# Patient Record
Sex: Male | Born: 1940 | Race: White | Hispanic: No | Marital: Married | Smoking: Former smoker
Health system: Southern US, Community
[De-identification: ages and names within clinical notes are randomized; demographics above are authoritative.]

## PROBLEM LIST (undated history)

## (undated) DIAGNOSIS — F0390 Unspecified dementia without behavioral disturbance: Secondary | ICD-10-CM

## (undated) DIAGNOSIS — M1990 Unspecified osteoarthritis, unspecified site: Secondary | ICD-10-CM

## (undated) DIAGNOSIS — K1321 Leukoplakia of oral mucosa, including tongue: Secondary | ICD-10-CM

## (undated) DIAGNOSIS — J309 Allergic rhinitis, unspecified: Secondary | ICD-10-CM

## (undated) DIAGNOSIS — K219 Gastro-esophageal reflux disease without esophagitis: Secondary | ICD-10-CM

## (undated) DIAGNOSIS — K264 Chronic or unspecified duodenal ulcer with hemorrhage: Secondary | ICD-10-CM

## (undated) DIAGNOSIS — I10 Essential (primary) hypertension: Secondary | ICD-10-CM

## (undated) DIAGNOSIS — G20A1 Parkinson's disease without dyskinesia, without mention of fluctuations: Secondary | ICD-10-CM

## (undated) DIAGNOSIS — H409 Unspecified glaucoma: Secondary | ICD-10-CM

## (undated) DIAGNOSIS — E7800 Pure hypercholesterolemia, unspecified: Secondary | ICD-10-CM

## (undated) DIAGNOSIS — N529 Male erectile dysfunction, unspecified: Secondary | ICD-10-CM

## (undated) DIAGNOSIS — D649 Anemia, unspecified: Secondary | ICD-10-CM

## (undated) DIAGNOSIS — E782 Mixed hyperlipidemia: Secondary | ICD-10-CM

## (undated) DIAGNOSIS — G20 Parkinson's disease: Secondary | ICD-10-CM

## (undated) DIAGNOSIS — N320 Bladder-neck obstruction: Secondary | ICD-10-CM

## (undated) HISTORY — DX: Leukoplakia of oral mucosa, including tongue: K13.21

## (undated) HISTORY — DX: Male erectile dysfunction, unspecified: N52.9

## (undated) HISTORY — DX: Bladder-neck obstruction: N32.0

## (undated) HISTORY — DX: Parkinson's disease without dyskinesia, without mention of fluctuations: G20.A1

## (undated) HISTORY — DX: Unspecified dementia, unspecified severity, without behavioral disturbance, psychotic disturbance, mood disturbance, and anxiety: F03.90

## (undated) HISTORY — DX: Allergic rhinitis, unspecified: J30.9

## (undated) HISTORY — DX: Unspecified glaucoma: H40.9

## (undated) HISTORY — DX: Pure hypercholesterolemia, unspecified: E78.00

## (undated) HISTORY — DX: Parkinson's disease: G20

## (undated) HISTORY — DX: Anemia, unspecified: D64.9

## (undated) HISTORY — DX: Mixed hyperlipidemia: E78.2

## (undated) HISTORY — DX: Chronic or unspecified duodenal ulcer with hemorrhage: K26.4

## (undated) HISTORY — PX: COLONOSCOPY: SHX174

---

## 2008-11-12 ENCOUNTER — Ambulatory Visit: Payer: Self-pay | Admitting: Unknown Physician Specialty

## 2009-04-28 ENCOUNTER — Encounter: Admission: RE | Admit: 2009-04-28 | Discharge: 2009-04-28 | Payer: Self-pay | Admitting: Neurological Surgery

## 2009-07-17 HISTORY — PX: BACK SURGERY: SHX140

## 2009-08-10 ENCOUNTER — Inpatient Hospital Stay (HOSPITAL_COMMUNITY): Admission: RE | Admit: 2009-08-10 | Discharge: 2009-08-11 | Payer: Self-pay | Admitting: Neurological Surgery

## 2010-07-06 LAB — BASIC METABOLIC PANEL
Calcium: 9.4 mg/dL (ref 8.4–10.5)
Creatinine, Ser: 1.02 mg/dL (ref 0.4–1.5)
GFR calc non Af Amer: >60 mL/min (ref >60)
Potassium: 4.5 mEq/L (ref 3.5–5.1)
Sodium: 139 mEq/L (ref 135–145)

## 2010-07-06 LAB — CBC
HCT: 34.9 % — ABNORMAL LOW (ref 39.0–52.0)
Hemoglobin: 12.3 g/dL — ABNORMAL LOW (ref 13.0–17.0)
MCV: 98.5 fL (ref 78.0–100.0)
RBC: 3.54 MIL/uL — ABNORMAL LOW (ref 4.22–5.81)
WBC: 7.7 10*3/uL (ref 4.0–10.5)

## 2010-07-06 LAB — SURGICAL PCR SCREEN
MRSA, PCR: NEGATIVE
Staphylococcus aureus: POSITIVE — AB

## 2010-07-18 ENCOUNTER — Ambulatory Visit: Payer: Self-pay | Admitting: Internal Medicine

## 2010-08-16 ENCOUNTER — Ambulatory Visit: Payer: Self-pay | Admitting: Internal Medicine

## 2010-08-18 ENCOUNTER — Ambulatory Visit: Payer: Self-pay | Admitting: Internal Medicine

## 2010-09-17 ENCOUNTER — Ambulatory Visit: Payer: Self-pay | Admitting: Internal Medicine

## 2010-11-26 ENCOUNTER — Ambulatory Visit: Payer: Self-pay | Admitting: Internal Medicine

## 2010-12-18 ENCOUNTER — Ambulatory Visit: Payer: Self-pay | Admitting: Internal Medicine

## 2011-03-24 ENCOUNTER — Other Ambulatory Visit: Payer: Self-pay | Admitting: Neurological Surgery

## 2011-03-24 DIAGNOSIS — M5126 Other intervertebral disc displacement, lumbar region: Secondary | ICD-10-CM

## 2011-03-25 ENCOUNTER — Ambulatory Visit: Payer: Self-pay | Admitting: Internal Medicine

## 2011-03-30 ENCOUNTER — Ambulatory Visit
Admission: RE | Admit: 2011-03-30 | Discharge: 2011-03-30 | Disposition: A | Discharge status: Home / Self Care | Payer: Medicare Other | Source: Ambulatory Visit | Attending: Neurological Surgery | Admitting: Neurological Surgery

## 2011-03-30 DIAGNOSIS — M5126 Other intervertebral disc displacement, lumbar region: Secondary | ICD-10-CM

## 2011-03-30 MED ORDER — GADOBENATE DIMEGLUMINE 529 MG/ML IV SOLN
15.0000 mL | Freq: Once | INTRAVENOUS | Status: AC | PRN
  Administered 2011-03-30: 15 mL via INTRAVENOUS

## 2011-04-07 ENCOUNTER — Other Ambulatory Visit: Payer: Self-pay | Admitting: Neurological Surgery

## 2011-04-19 ENCOUNTER — Ambulatory Visit: Payer: Self-pay | Admitting: Internal Medicine

## 2011-04-25 ENCOUNTER — Encounter (HOSPITAL_COMMUNITY): Payer: Self-pay | Admitting: Pharmacy Technician

## 2011-04-27 NOTE — Pre-Procedure Instructions (Signed)
20 Michael Stevens  04/27/2011   Your procedure is scheduled on:  Friday, January 18th.   Report to [facility] at 5:30 AM.   Call this number if you have problems the morning of surgery: [phone]   Remember:   Do not eat food:After Midnight.  May have clear liquids: up to 4 Hours before arrival. 1:30 am  Clear liquids include soda, tea, black coffee, apple or grape juice, broth.  Take these medicines the morning of surgery with A SIP OF WATER: Hydrocodone- Acetaminophen   Do not wear jewelry, make-up or nail polish.  Do not wear lotions, powders, or perfumes. You may wear deodorant.  Do not shave 48 hours prior to surgery.  Do not bring valuables to the hospital.  Contacts, dentures or bridgework may not be worn into surgery.  Leave suitcase in the car. After surgery it may be brought to your room.  For patients admitted to the hospital, checkout time is 11:00 AM the day of discharge.   Patients discharged the day of surgery will not be allowed to drive home.  Name and phone number of your driver: -  Special Instructions: CHG Shower Use Special Wash: 1/2 bottle night before surgery and 1/2 bottle morning of surgery.   Please read over the following fact sheets that you were given: Pain Booklet, Coughing and Deep Breathing, Blood Transfusion Information, MRSA Information and Surgical Site Infection Prevention

## 2011-04-28 ENCOUNTER — Encounter (HOSPITAL_COMMUNITY): Payer: Self-pay

## 2011-04-28 ENCOUNTER — Encounter (HOSPITAL_COMMUNITY)
Admission: RE | Admit: 2011-04-28 | Discharge: 2011-04-28 | Disposition: A | Discharge status: Home / Self Care | Payer: Medicare Other | Source: Ambulatory Visit | Attending: Anesthesiology | Admitting: Anesthesiology

## 2011-04-28 ENCOUNTER — Encounter (HOSPITAL_COMMUNITY)
Admission: RE | Admit: 2011-04-28 | Discharge: 2011-04-28 | Disposition: A | Discharge status: Home / Self Care | Payer: Medicare Other | Source: Ambulatory Visit | Attending: Neurological Surgery | Admitting: Neurological Surgery

## 2011-04-28 DIAGNOSIS — Z01818 Encounter for other preprocedural examination: Secondary | ICD-10-CM | POA: Insufficient documentation

## 2011-04-28 DIAGNOSIS — Z01810 Encounter for preprocedural cardiovascular examination: Secondary | ICD-10-CM | POA: Insufficient documentation

## 2011-04-28 DIAGNOSIS — Z01812 Encounter for preprocedural laboratory examination: Secondary | ICD-10-CM | POA: Insufficient documentation

## 2011-04-28 HISTORY — DX: Unspecified osteoarthritis, unspecified site: M19.90

## 2011-04-28 HISTORY — DX: Essential (primary) hypertension: I10

## 2011-04-28 HISTORY — DX: Gastro-esophageal reflux disease without esophagitis: K21.9

## 2011-04-28 LAB — BASIC METABOLIC PANEL
BUN: 14 mg/dL (ref 6–23)
Chloride: 104 mEq/L (ref 96–112)
Creatinine, Ser: 1.01 mg/dL (ref 0.50–1.35)
GFR calc Af Amer: 85 mL/min — ABNORMAL LOW (ref >90)
GFR calc non Af Amer: 73 mL/min — ABNORMAL LOW (ref >90)
Potassium: 4.3 mEq/L (ref 3.5–5.1)

## 2011-04-28 LAB — CBC
Hemoglobin: 11.9 g/dL — ABNORMAL LOW (ref 13.0–17.0)
MCHC: 34.8 g/dL (ref 30.0–36.0)
Platelets: 139 10*3/uL — ABNORMAL LOW (ref 150–400)

## 2011-04-28 LAB — SURGICAL PCR SCREEN
MRSA, PCR: NEGATIVE
Staphylococcus aureus: NEGATIVE

## 2011-05-05 MED ORDER — CEFAZOLIN SODIUM 1-5 GM-% IV SOLN: 1.0000 g | INTRAVENOUS | Status: DC

## 2011-05-05 NOTE — Progress Notes (Signed)
Spoke with Marcelino Duster at [facility] about EKG we needed from Dr. Milinda Cave is going to fax it over. Aware pt is for OR 1/18 @ 0730.

## 2011-05-06 ENCOUNTER — Ambulatory Visit (HOSPITAL_COMMUNITY): Admission: RE | Admit: 2011-05-06 | Payer: Medicare Other | Source: Ambulatory Visit | Admitting: Neurological Surgery

## 2011-05-06 ENCOUNTER — Encounter (HOSPITAL_COMMUNITY): Admission: RE | Payer: Self-pay | Source: Ambulatory Visit

## 2011-05-06 SURGERY — LUMBAR LAMINECTOMY/ DECOMPRESSION WITH MET-RX
Anesthesia: General | Site: Back | Laterality: Left

## 2011-05-09 ENCOUNTER — Ambulatory Visit: Payer: Self-pay | Admitting: Family Medicine

## 2011-10-05 DIAGNOSIS — D3701 Neoplasm of uncertain behavior of lip: Secondary | ICD-10-CM | POA: Insufficient documentation

## 2011-11-21 DIAGNOSIS — D1030 Benign neoplasm of unspecified part of mouth: Secondary | ICD-10-CM | POA: Insufficient documentation

## 2012-03-05 ENCOUNTER — Ambulatory Visit: Payer: Self-pay | Admitting: Otolaryngology

## 2012-10-18 ENCOUNTER — Other Ambulatory Visit: Payer: Self-pay | Admitting: Neurological Surgery

## 2012-10-18 DIAGNOSIS — M48061 Spinal stenosis, lumbar region without neurogenic claudication: Secondary | ICD-10-CM

## 2012-10-18 DIAGNOSIS — M47812 Spondylosis without myelopathy or radiculopathy, cervical region: Secondary | ICD-10-CM

## 2012-10-29 ENCOUNTER — Ambulatory Visit
Admission: RE | Admit: 2012-10-29 | Discharge: 2012-10-29 | Disposition: A | Discharge status: Home / Self Care | Payer: Medicare Other | Source: Ambulatory Visit | Attending: Neurological Surgery | Admitting: Neurological Surgery

## 2012-10-29 DIAGNOSIS — M48061 Spinal stenosis, lumbar region without neurogenic claudication: Secondary | ICD-10-CM

## 2012-10-29 DIAGNOSIS — M47812 Spondylosis without myelopathy or radiculopathy, cervical region: Secondary | ICD-10-CM

## 2014-02-06 DIAGNOSIS — R195 Other fecal abnormalities: Secondary | ICD-10-CM | POA: Insufficient documentation

## 2014-02-27 ENCOUNTER — Ambulatory Visit: Payer: Self-pay | Admitting: Unknown Physician Specialty

## 2014-06-17 DIAGNOSIS — N320 Bladder-neck obstruction: Secondary | ICD-10-CM | POA: Diagnosis not present

## 2014-06-17 DIAGNOSIS — E785 Hyperlipidemia, unspecified: Secondary | ICD-10-CM | POA: Diagnosis not present

## 2014-06-17 DIAGNOSIS — I10 Essential (primary) hypertension: Secondary | ICD-10-CM | POA: Diagnosis not present

## 2014-06-17 DIAGNOSIS — K219 Gastro-esophageal reflux disease without esophagitis: Secondary | ICD-10-CM | POA: Diagnosis not present

## 2014-06-17 LAB — LIPID PANEL
CHOLESTEROL: 170 mg/dL (ref 0–200)
HDL: 46 mg/dL (ref 35–70)
LDL Cholesterol: 92 mg/dL
TRIGLYCERIDES: 159 mg/dL (ref 40–160)

## 2014-06-17 LAB — CBC AND DIFFERENTIAL
HEMATOCRIT: 36 % — AB (ref 41–53)
Hemoglobin: 12.1 g/dL — AB (ref 13.5–17.5)
Neutrophils Absolute: 5 /uL
PLATELETS: 134 10*3/uL — AB (ref 150–399)
WBC: 10.1 10*3/mL

## 2014-06-17 LAB — BASIC METABOLIC PANEL
BUN: 18 mg/dL (ref 4–21)
CREATININE: 1.1 mg/dL (ref 0.6–1.3)
GLUCOSE: 109 mg/dL
Potassium: 4.1 mmol/L (ref 3.4–5.3)
SODIUM: 137 mmol/L (ref 137–147)

## 2014-06-17 LAB — HEPATIC FUNCTION PANEL
ALK PHOS: 44 U/L (ref 25–125)
ALT: 9 U/L — AB (ref 10–40)
AST: 17 U/L (ref 14–40)
BILIRUBIN DIRECT: 0.11 mg/dL (ref 0.01–0.4)
Bilirubin, Total: 0.4 mg/dL

## 2014-06-18 ENCOUNTER — Ambulatory Visit: Payer: Self-pay | Admitting: Family Medicine

## 2014-06-18 DIAGNOSIS — R1013 Epigastric pain: Secondary | ICD-10-CM | POA: Diagnosis not present

## 2014-06-18 DIAGNOSIS — I10 Essential (primary) hypertension: Secondary | ICD-10-CM | POA: Diagnosis not present

## 2014-08-11 LAB — SURGICAL PATHOLOGY

## 2014-08-12 DIAGNOSIS — J301 Allergic rhinitis due to pollen: Secondary | ICD-10-CM | POA: Diagnosis not present

## 2014-08-12 DIAGNOSIS — K1321 Leukoplakia of oral mucosa, including tongue: Secondary | ICD-10-CM | POA: Diagnosis not present

## 2014-10-13 ENCOUNTER — Encounter: Payer: Self-pay | Admitting: Family Medicine

## 2014-10-13 ENCOUNTER — Ambulatory Visit (INDEPENDENT_AMBULATORY_CARE_PROVIDER_SITE_OTHER): Payer: Medicare Other | Admitting: Family Medicine

## 2014-10-13 VITALS — BP 118/78 | HR 69 | Temp 97.9°F | Resp 16 | Ht 65.0 in | Wt 170.8 lb

## 2014-10-13 DIAGNOSIS — H409 Unspecified glaucoma: Secondary | ICD-10-CM | POA: Insufficient documentation

## 2014-10-13 DIAGNOSIS — E7800 Pure hypercholesterolemia, unspecified: Secondary | ICD-10-CM | POA: Insufficient documentation

## 2014-10-13 DIAGNOSIS — K2970 Gastritis, unspecified, without bleeding: Secondary | ICD-10-CM | POA: Insufficient documentation

## 2014-10-13 DIAGNOSIS — K219 Gastro-esophageal reflux disease without esophagitis: Secondary | ICD-10-CM

## 2014-10-13 DIAGNOSIS — N320 Bladder-neck obstruction: Secondary | ICD-10-CM | POA: Insufficient documentation

## 2014-10-13 DIAGNOSIS — I10 Essential (primary) hypertension: Secondary | ICD-10-CM | POA: Insufficient documentation

## 2014-10-13 DIAGNOSIS — J309 Allergic rhinitis, unspecified: Secondary | ICD-10-CM | POA: Insufficient documentation

## 2014-10-13 DIAGNOSIS — K5790 Diverticulosis of intestine, part unspecified, without perforation or abscess without bleeding: Secondary | ICD-10-CM | POA: Insufficient documentation

## 2014-10-13 NOTE — Progress Notes (Signed)
Name: Michael Stevens   MRN: [id number]    DOB: 06-27-1940   Date:10/13/2014       Progress Note  Subjective  Chief Complaint  Chief Complaint  Patient presents with  . Hypertension    Patient does not check at home, stable on medications  . Gastrophageal Reflux    Patient states that omeprazole keeps symptoms under good control     Hypertension This is a chronic problem. The problem is unchanged. The problem is controlled. Pertinent negatives include no chest pain, headaches, palpitations, peripheral edema or shortness of breath. Past treatments include ACE inhibitors. The current treatment provides significant improvement. There is no history of angina, kidney disease, CAD/MI or CVA.  Gastrophageal Reflux He complains of dysphagia (occasionally he has some difficulty swallowing). He reports no chest pain, no coughing, no heartburn, no nausea or no sore throat. This is a chronic problem. The problem has been unchanged. The symptoms are aggravated by certain foods. Associated symptoms include anemia (mild anemia). Pertinent negatives include no fatigue or melena. Risk factors include NSAIDs. He has tried a PPI for the symptoms. The treatment provided significant relief. Past procedures do not include an EGD.  Hyperlipidemia This is a chronic problem. Recent lipid tests were reviewed and are normal. Pertinent negatives include no chest pain, leg pain, myalgias or shortness of breath. Current antihyperlipidemic treatment includes statins. The current treatment provides significant improvement of lipids.      Past Medical History  Diagnosis Date  . Hypertension   . GERD (gastroesophageal reflux disease)     ocassional  . Arthritis     Back  . Anemia   . Glaucoma   . Allergic rhinitis   . Impotence   . Bladder neck obstruction     Past Surgical History  Procedure Laterality Date  . Back surgery  April 2011    Family History  Problem Relation Age of Onset  . Anesthesia problems  Neg Hx     History   Social History  . Marital Status: Married    Spouse Name: Michael Stevens  . Number of Children: 2  . Years of Education: N/A   Occupational History  . Retired    Social History Main Topics  . Smoking status: Former Smoker -- 25 years    Quit date: 12/01/1982  . Smokeless tobacco: Not on file  . Alcohol Use: 0.6 oz/week    1 Cans of beer per week     Comment: occasional  . Drug Use: No  . Sexual Activity: Not on file   Other Topics Concern  . Not on file   Social History Narrative     Current outpatient prescriptions:  .  brimonidine-timolol (COMBIGAN) 0.2-0.5 % ophthalmic solution, Place 1 drop into both eyes every 12 (twelve) hours.  , Disp: , Rfl:  .  Camphor-Eucalyptus-Menthol (VICKS VAPORUB EX), Apply 1 application topically at bedtime. To clear nasal congestion, Disp: , Rfl:  .  ibuprofen (ADVIL,MOTRIN) 200 MG tablet, Take 1 tablet by mouth as needed., Disp: , Rfl:  .  omeprazole (PRILOSEC) 40 MG capsule, Take 40 mg by mouth daily as needed., Disp: , Rfl:  .  quinapril (ACCUPRIL) 40 MG tablet, Take 40 mg by mouth at bedtime.  , Disp: , Rfl:  .  simvastatin (ZOCOR) 40 MG tablet, Take 40 mg by mouth every evening. , Disp: , Rfl:  .  tamsulosin (FLOMAX) 0.4 MG CAPS capsule, Take 1 capsule by mouth daily., Disp: , Rfl:   No  Known Allergies   Review of Systems  Constitutional: Negative for fatigue.  HENT: Negative for sore throat.   Respiratory: Negative for cough and shortness of breath.   Cardiovascular: Negative for chest pain and palpitations.  Gastrointestinal: Positive for dysphagia (occasionally he has some difficulty swallowing). Negative for heartburn, nausea and melena.  Musculoskeletal: Negative for myalgias.  Neurological: Negative for headaches.      Objective  Filed Vitals:   10/13/14 1044  BP: 118/78  Pulse: 69  Temp: 97.9 F (36.6 C)  TempSrc: Oral  Resp: 16  Height: 5\' 5"  (1.651 m)  Weight: 170 lb 12.8 oz (77.474 kg)   SpO2: 98%    Physical Exam  Constitutional: He is oriented to person, place, and time and well-developed, well-nourished, and in no distress.  HENT:  Head: Normocephalic and atraumatic.  Mouth/Throat: Oropharynx is clear and moist.  Eyes: Conjunctivae are normal. Pupils are equal, round, and reactive to light.  Neck: Normal range of motion. Neck supple.  Cardiovascular: Normal rate and regular rhythm.   Pulmonary/Chest: Effort normal and breath sounds normal.  Abdominal: Soft. Bowel sounds are normal.  Neurological: He is alert and oriented to person, place, and time.  Skin: Skin is warm and dry.  Nursing note and vitals reviewed.      No results found for this or any previous visit (from the past 2160 hour(s)).   Assessment & Plan 1. Essential hypertension Blood pressure at goal on present therapy.  2. Hypercholesteremia Patient is on simvastatin 40 mg at bedtime. Last fasting lipid panel showed elevated triglycerides at  159. Repeat today. - Lipid panel  3. Gastroesophageal reflux disease, esophagitis presence not specified Symptoms controlled on present PPI therapy.  - CBC  There are no diagnoses linked to this encounter.  Talmage Teaster Asad A[facility] 10/13/2014 11:00 AM

## 2014-10-14 LAB — CBC
HEMOGLOBIN: 11.9 g/dL — AB (ref 12.6–17.7)
Hematocrit: 35.4 % — ABNORMAL LOW (ref 37.5–51.0)
MCH: 31.1 pg (ref 26.6–33.0)
MCHC: 33.6 g/dL (ref 31.5–35.7)
MCV: 92 fL (ref 79–97)
Platelets: 130 10*3/uL — ABNORMAL LOW (ref 150–379)
RBC: 3.83 x10E6/uL — AB (ref 4.14–5.80)
RDW: 16.8 % — ABNORMAL HIGH (ref 12.3–15.4)
WBC: 8 10*3/uL (ref 3.4–10.8)

## 2014-10-14 LAB — LIPID PANEL
CHOL/HDL RATIO: 3.8 ratio (ref 0.0–5.0)
Cholesterol, Total: 159 mg/dL (ref 100–199)
HDL: 42 mg/dL (ref >39)
LDL CALC: 84 mg/dL (ref 0–99)
Triglycerides: 166 mg/dL — ABNORMAL HIGH (ref 0–149)
VLDL CHOLESTEROL CAL: 33 mg/dL (ref 5–40)

## 2014-10-15 NOTE — Progress Notes (Signed)
Called pt, advised of labs, recheck in 4 months.

## 2014-11-03 ENCOUNTER — Other Ambulatory Visit: Payer: Self-pay | Admitting: Family Medicine

## 2014-11-03 MED ORDER — QUINAPRIL HCL 40 MG PO TABS: 40.0000 mg | ORAL_TABLET | Freq: Every day | ORAL | Status: DC

## 2014-11-03 MED ORDER — TAMSULOSIN HCL 0.4 MG PO CAPS: 0.4000 mg | ORAL_CAPSULE | Freq: Every day | ORAL | Status: DC

## 2014-11-03 MED ORDER — OMEPRAZOLE 40 MG PO CPDR: 40.0000 mg | DELAYED_RELEASE_CAPSULE | Freq: Every day | ORAL | Status: DC | PRN

## 2014-11-03 MED ORDER — SIMVASTATIN 40 MG PO TABS: 40.0000 mg | ORAL_TABLET | Freq: Every evening | ORAL | Status: DC

## 2014-11-03 NOTE — Telephone Encounter (Signed)
Medication has been refilled and sent to [facility].  

## 2015-01-13 ENCOUNTER — Ambulatory Visit: Payer: Medicare Other | Admitting: Family Medicine

## 2015-01-14 ENCOUNTER — Encounter: Payer: Self-pay | Admitting: Family Medicine

## 2015-01-14 ENCOUNTER — Ambulatory Visit (INDEPENDENT_AMBULATORY_CARE_PROVIDER_SITE_OTHER): Payer: Medicare Other | Admitting: Family Medicine

## 2015-01-14 VITALS — BP 118/75 | HR 78 | Temp 97.6°F | Resp 18 | Ht 65.0 in | Wt 173.3 lb

## 2015-01-14 DIAGNOSIS — N320 Bladder-neck obstruction: Secondary | ICD-10-CM

## 2015-01-14 DIAGNOSIS — E7800 Pure hypercholesterolemia, unspecified: Secondary | ICD-10-CM

## 2015-01-14 DIAGNOSIS — K219 Gastro-esophageal reflux disease without esophagitis: Secondary | ICD-10-CM

## 2015-01-14 DIAGNOSIS — I10 Essential (primary) hypertension: Secondary | ICD-10-CM | POA: Diagnosis not present

## 2015-01-14 DIAGNOSIS — D649 Anemia, unspecified: Secondary | ICD-10-CM | POA: Diagnosis not present

## 2015-01-14 MED ORDER — TAMSULOSIN HCL 0.4 MG PO CAPS: 0.4000 mg | ORAL_CAPSULE | Freq: Every day | ORAL | Status: DC

## 2015-01-14 MED ORDER — QUINAPRIL HCL 40 MG PO TABS: 40.0000 mg | ORAL_TABLET | Freq: Every day | ORAL | Status: DC

## 2015-01-14 MED ORDER — SIMVASTATIN 40 MG PO TABS: 40.0000 mg | ORAL_TABLET | Freq: Every evening | ORAL | Status: DC

## 2015-01-14 MED ORDER — OMEPRAZOLE 40 MG PO CPDR: 40.0000 mg | DELAYED_RELEASE_CAPSULE | Freq: Every day | ORAL | Status: DC

## 2015-01-14 NOTE — Progress Notes (Signed)
Name: Michael Stevens   MRN: [id number]    DOB: Oct 02, 1940   Date:01/14/2015       Progress Note  Subjective  Chief Complaint  Chief Complaint  Patient presents with  . Follow-up    3 mo  . Hypertension    Hyperlipidemia This is a chronic problem. The problem is controlled. Recent lipid tests were reviewed and are normal. Pertinent negatives include no chest pain, leg pain or myalgias. Current antihyperlipidemic treatment includes statins.  Anemia Presents for follow-up visit. There has been no abdominal pain, anorexia, palpitations or weight loss. Signs of blood loss that are not present include hematemesis and melena.  Hypertension This is a chronic problem. The problem is controlled. Pertinent negatives include no chest pain, headaches or palpitations. Past treatments include ACE inhibitors. There is no history of kidney disease, CAD/MI or CVA.    Past Medical History  Diagnosis Date  . Hypertension   . GERD (gastroesophageal reflux disease)     ocassional  . Arthritis     Back  . Anemia   . Glaucoma   . Allergic rhinitis   . Impotence   . Bladder neck obstruction     Past Surgical History  Procedure Laterality Date  . Back surgery  April 2011    Family History  Problem Relation Age of Onset  . Anesthesia problems Neg Hx     Social History   Social History  . Marital Status: Married    Spouse Name: [name]  . Number of Children: 2  . Years of Education: N/A   Occupational History  . Retired    Social History Main Topics  . Smoking status: Former Smoker -- 25 years    Quit date: 12/01/1982  . Smokeless tobacco: Not on file  . Alcohol Use: 0.6 oz/week    1 Cans of beer per week     Comment: occasional  . Drug Use: No  . Sexual Activity: Not on file   Other Topics Concern  . Not on file   Social History Narrative    Current outpatient prescriptions:  .  brimonidine-timolol (COMBIGAN) 0.2-0.5 % ophthalmic solution, Place 1 drop into both eyes every  12 (twelve) hours.  , Disp: , Rfl:  .  Camphor-Eucalyptus-Menthol (VICKS VAPORUB EX), Apply 1 application topically at bedtime. To clear nasal congestion, Disp: , Rfl:  .  ibuprofen (ADVIL,MOTRIN) 200 MG tablet, Take 1 tablet by mouth as needed., Disp: , Rfl:  .  omeprazole (PRILOSEC) 40 MG capsule, Take 1 capsule (40 mg total) by mouth daily as needed., Disp: 90 capsule, Rfl: 0 .  quinapril (ACCUPRIL) 40 MG tablet, Take 1 tablet (40 mg total) by mouth at bedtime., Disp: 90 tablet, Rfl: 0 .  simvastatin (ZOCOR) 40 MG tablet, Take 1 tablet (40 mg total) by mouth every evening., Disp: 90 tablet, Rfl: 0 .  traMADol (ULTRAM) 50 MG tablet, , Disp: , Rfl:   No Known Allergies   Review of Systems  Constitutional: Negative for weight loss.  Cardiovascular: Negative for chest pain and palpitations.  Gastrointestinal: Negative for abdominal pain, melena, anorexia and hematemesis.  Musculoskeletal: Negative for myalgias.  Neurological: Negative for headaches.   Objective  Filed Vitals:   01/14/15 1003  BP: 118/75  Pulse: 78  Temp: 97.6 F (36.4 C)  TempSrc: Oral  Resp: 18  Height: 5\' 5"  (1.651 m)  Weight: 173 lb 4.8 oz (78.608 kg)  SpO2: 97%    Physical Exam  Constitutional: He is oriented  to person, place, and time and well-developed, well-nourished, and in no distress.  Cardiovascular: Normal rate and regular rhythm.   Pulmonary/Chest: Effort normal and breath sounds normal.  Abdominal: Soft. Bowel sounds are normal.  Musculoskeletal: He exhibits no edema.  Neurological: He is alert and oriented to person, place, and time.  Skin: Skin is warm and dry.  Nursing note and vitals reviewed.  Assessment & Plan  1. Hypercholesteremia  - simvastatin (ZOCOR) 40 MG tablet; Take 1 tablet (40 mg total) by mouth every evening.  Dispense: 90 tablet; Refill: 0  2. Anemia, unspecified anemia type Patient has anemia for a long time, recent hemoglobin level  11.9 and hematocrit 35.4, stable  from 3 months ago. Patient has had more workup performed for anemia so far. He declines any additional workup at this time. Records show a normal colonoscopy in November 2015. Recheck CBC and if there is a change from baseline, we will proceed with complete workup. Patient verbalized understanding with the plan. - CBC with Differential  3. Essential hypertension Blood Pressure controlled on present therapy. - quinapril (ACCUPRIL) 40 MG tablet; Take 1 tablet (40 mg total) by mouth at bedtime.  Dispense: 90 tablet; Refill: 0  4. Gastroesophageal reflux disease, esophagitis presence not specified  - omeprazole (PRILOSEC) 40 MG capsule; Take 1 capsule (40 mg total) by mouth daily.  Dispense: 90 capsule; Refill: 0  5. Bladder neck obstruction Symptoms stable on alpha blocker therapy. - tamsulosin (FLOMAX) 0.4 MG CAPS capsule; Take 1 capsule (0.4 mg total) by mouth daily.  Dispense: 90 capsule; Refill: 0   Syed Asad [facility] 01/14/2015 10:43 AM

## 2015-01-15 LAB — CBC WITH DIFFERENTIAL/PLATELET
BASOS ABS: 0.1 10*3/uL (ref 0.0–0.2)
Basos: 1 %
EOS (ABSOLUTE): 0.5 10*3/uL — ABNORMAL HIGH (ref 0.0–0.4)
Eos: 6 %
HEMATOCRIT: 36.8 % — AB (ref 37.5–51.0)
HEMOGLOBIN: 12.7 g/dL (ref 12.6–17.7)
Immature Grans (Abs): 0 10*3/uL (ref 0.0–0.1)
Immature Granulocytes: 0 %
LYMPHS ABS: 3.2 10*3/uL — AB (ref 0.7–3.1)
Lymphs: 36 %
MCH: 31.5 pg (ref 26.6–33.0)
MCHC: 34.5 g/dL (ref 31.5–35.7)
MCV: 91 fL (ref 79–97)
MONOCYTES: 12 %
Monocytes Absolute: 1.1 10*3/uL — ABNORMAL HIGH (ref 0.1–0.9)
NEUTROS ABS: 3.9 10*3/uL (ref 1.4–7.0)
Neutrophils: 45 %
Platelets: 158 10*3/uL (ref 150–379)
RBC: 4.03 x10E6/uL — ABNORMAL LOW (ref 4.14–5.80)
RDW: 16.3 % — ABNORMAL HIGH (ref 12.3–15.4)
WBC: 8.8 10*3/uL (ref 3.4–10.8)

## 2015-02-11 DIAGNOSIS — N529 Male erectile dysfunction, unspecified: Secondary | ICD-10-CM | POA: Insufficient documentation

## 2015-02-11 DIAGNOSIS — K648 Other hemorrhoids: Secondary | ICD-10-CM | POA: Insufficient documentation

## 2015-03-04 ENCOUNTER — Other Ambulatory Visit: Payer: Self-pay | Admitting: Family Medicine

## 2015-03-16 DIAGNOSIS — H4010X0 Unspecified open-angle glaucoma, stage unspecified: Secondary | ICD-10-CM | POA: Diagnosis not present

## 2015-03-23 DIAGNOSIS — H401131 Primary open-angle glaucoma, bilateral, mild stage: Secondary | ICD-10-CM | POA: Diagnosis not present

## 2015-03-23 DIAGNOSIS — H4010X0 Unspecified open-angle glaucoma, stage unspecified: Secondary | ICD-10-CM | POA: Diagnosis not present

## 2015-04-15 ENCOUNTER — Ambulatory Visit (INDEPENDENT_AMBULATORY_CARE_PROVIDER_SITE_OTHER): Payer: Medicare Other | Admitting: Family Medicine

## 2015-04-15 ENCOUNTER — Encounter: Payer: Self-pay | Admitting: Family Medicine

## 2015-04-15 VITALS — BP 118/70 | HR 97 | Temp 98.2°F | Resp 14 | Ht 65.0 in | Wt 175.7 lb

## 2015-04-15 DIAGNOSIS — E7800 Pure hypercholesterolemia, unspecified: Secondary | ICD-10-CM

## 2015-04-15 DIAGNOSIS — I10 Essential (primary) hypertension: Secondary | ICD-10-CM

## 2015-04-15 NOTE — Progress Notes (Signed)
Name: Michael Stevens   MRN: [id number]    DOB: June 25, 1940   Date:04/15/2015       Progress Note  Subjective  Chief Complaint  Chief Complaint  Patient presents with  . Follow-up    3 mo  . Anemia  . Gastroesophageal Reflux    Hypertension This is a chronic problem. The problem is unchanged. The problem is controlled. Pertinent negatives include no blurred vision, chest pain, headaches, orthopnea, palpitations or shortness of breath. Past treatments include ACE inhibitors. The current treatment provides significant improvement. There is no history of kidney disease, CAD/MI or CVA.  Hyperlipidemia This is a chronic problem. The problem is controlled. Recent lipid tests were reviewed and are normal. Pertinent negatives include no chest pain, leg pain, myalgias or shortness of breath. Current antihyperlipidemic treatment includes statins.    Past Medical History  Diagnosis Date  . Hypertension   . GERD (gastroesophageal reflux disease)     ocassional  . Arthritis     Back  . Anemia   . Glaucoma   . Allergic rhinitis   . Impotence   . Bladder neck obstruction     Past Surgical History  Procedure Laterality Date  . Back surgery  April 2011    Family History  Problem Relation Age of Onset  . Anesthesia problems Neg Hx     Social History   Social History  . Marital Status: Married    Spouse Name: Michael Stevens  . Number of Children: 2  . Years of Education: N/A   Occupational History  . Retired    Social History Main Topics  . Smoking status: Former Smoker -- 25 years    Quit date: 12/01/1982  . Smokeless tobacco: Not on file  . Alcohol Use: 0.6 oz/week    1 Cans of beer per week     Comment: occasional  . Drug Use: No  . Sexual Activity: Not on file   Other Topics Concern  . Not on file   Social History Narrative     Current outpatient prescriptions:  .  brimonidine-timolol (COMBIGAN) 0.2-0.5 % ophthalmic solution, Place 1 drop into both eyes every 12  (twelve) hours.  , Disp: , Rfl:  .  Camphor-Eucalyptus-Menthol (VICKS VAPORUB EX), Apply 1 application topically at bedtime. To clear nasal congestion, Disp: , Rfl:  .  Naproxen Sodium (ALEVE) 220 MG CAPS, Take by mouth., Disp: , Rfl:  .  omeprazole (PRILOSEC) 40 MG capsule, Take 1 capsule by mouth  daily, Disp: 90 capsule, Rfl: 1 .  quinapril (ACCUPRIL) 40 MG tablet, Take 1 tablet by mouth at  bedtime, Disp: 90 tablet, Rfl: 1 .  simvastatin (ZOCOR) 40 MG tablet, Take 1 tablet by mouth  every evening, Disp: 90 tablet, Rfl: 1 .  tamsulosin (FLOMAX) 0.4 MG CAPS capsule, Take 1 capsule by mouth  daily, Disp: 90 capsule, Rfl: 1 .  traMADol (ULTRAM) 50 MG tablet, , Disp: , Rfl:   No Known Allergies   Review of Systems  Eyes: Negative for blurred vision.  Respiratory: Negative for shortness of breath.   Cardiovascular: Negative for chest pain, palpitations and orthopnea.  Musculoskeletal: Negative for myalgias.  Neurological: Negative for headaches.    Objective  Filed Vitals:   04/15/15 1036  BP: 118/70  Pulse: 97  Temp: 98.2 F (36.8 C)  TempSrc: Oral  Resp: 14  Height: 5\' 5"  (1.651 m)  Weight: 175 lb 11.2 oz (79.697 kg)  SpO2: 98%    Physical Exam  Constitutional: He is oriented to person, place, and time and well-developed, well-nourished, and in no distress.  Cardiovascular: Normal rate and regular rhythm.   Pulmonary/Chest: Effort normal and breath sounds normal.  Abdominal: Soft. Bowel sounds are normal.  Musculoskeletal: He exhibits no edema.  Neurological: He is alert and oriented to person, place, and time.  Skin: Skin is warm and dry.  Nursing note and vitals reviewed.   Assessment & Plan  1. Essential hypertension BP at goal.  2. Hypercholesteremia  - Lipid Profile - Comprehensive Metabolic Panel (CMET)   Michael Stevens Asad A[facility] 04/15/2015 11:03 AM

## 2015-07-14 ENCOUNTER — Ambulatory Visit (INDEPENDENT_AMBULATORY_CARE_PROVIDER_SITE_OTHER): Payer: Medicare Other | Admitting: Family Medicine

## 2015-07-14 ENCOUNTER — Encounter: Payer: Self-pay | Admitting: Family Medicine

## 2015-07-14 VITALS — BP 112/68 | HR 76 | Temp 97.8°F | Resp 15 | Ht 65.0 in | Wt 178.4 lb

## 2015-07-14 DIAGNOSIS — M5136 Other intervertebral disc degeneration, lumbar region: Secondary | ICD-10-CM | POA: Diagnosis not present

## 2015-07-14 DIAGNOSIS — I10 Essential (primary) hypertension: Secondary | ICD-10-CM | POA: Diagnosis not present

## 2015-07-14 MED ORDER — MELOXICAM 7.5 MG PO TABS: 7.5000 mg | ORAL_TABLET | Freq: Every day | ORAL | Status: DC

## 2015-07-14 NOTE — Progress Notes (Signed)
Name: Michael Stevens   MRN: [id number]    DOB: 1940/09/24   Date:07/14/2015       Progress Note  Subjective  Chief Complaint  Chief Complaint  Patient presents with  . Follow-up    3 mo    Hypertension This is a chronic problem. The problem is controlled. Pertinent negatives include no chest pain, headaches, palpitations or shortness of breath. Past treatments include ACE inhibitors.   Chronic Low Back Pain: Pt. Has advanced degenerative disc disease, chronic low back pain, had surgery by Dr. Ellene Route 5 years ago. Pain worse with movement and activity such as bending. He has taken Aleve with some relief and is asking about Meloxicam for degenerative disc disease.    Past Medical History  Diagnosis Date  . Hypertension   . GERD (gastroesophageal reflux disease)     ocassional  . Arthritis     Back  . Anemia   . Glaucoma   . Allergic rhinitis   . Impotence   . Bladder neck obstruction     Past Surgical History  Procedure Laterality Date  . Back surgery  April 2011    Family History  Problem Relation Age of Onset  . Anesthesia problems Neg Hx     Social History   Social History  . Marital Status: Married    Spouse Name: [name]  . Number of Children: 2  . Years of Education: N/A   Occupational History  . Retired    Social History Main Topics  . Smoking status: Former Smoker -- 25 years    Quit date: 12/01/1982  . Smokeless tobacco: Not on file  . Alcohol Use: 0.6 oz/week    1 Cans of beer per week     Comment: occasional  . Drug Use: No  . Sexual Activity: Not on file   Other Topics Concern  . Not on file   Social History Narrative     Current outpatient prescriptions:  .  brimonidine-timolol (COMBIGAN) 0.2-0.5 % ophthalmic solution, Place 1 drop into both eyes every 12 (twelve) hours.  , Disp: , Rfl:  .  Camphor-Eucalyptus-Menthol (VICKS VAPORUB EX), Apply 1 application topically at bedtime. To clear nasal congestion, Disp: , Rfl:  .  Naproxen Sodium  (ALEVE) 220 MG CAPS, Take by mouth., Disp: , Rfl:  .  omeprazole (PRILOSEC) 40 MG capsule, Take 1 capsule by mouth  daily, Disp: 90 capsule, Rfl: 1 .  quinapril (ACCUPRIL) 40 MG tablet, Take 1 tablet by mouth at  bedtime, Disp: 90 tablet, Rfl: 1 .  simvastatin (ZOCOR) 40 MG tablet, Take 1 tablet by mouth  every evening, Disp: 90 tablet, Rfl: 1 .  tamsulosin (FLOMAX) 0.4 MG CAPS capsule, Take 1 capsule by mouth  daily, Disp: 90 capsule, Rfl: 1 .  traMADol (ULTRAM) 50 MG tablet, , Disp: , Rfl:   No Known Allergies   Review of Systems  Respiratory: Negative for shortness of breath.   Cardiovascular: Negative for chest pain and palpitations.  Gastrointestinal: Negative for heartburn, nausea, vomiting and abdominal pain.  Musculoskeletal: Positive for back pain. Negative for joint pain.  Neurological: Negative for headaches.   Objective  Filed Vitals:   07/14/15 0924  BP: 112/68  Pulse: 76  Temp: 97.8 F (36.6 C)  TempSrc: Oral  Resp: 15  Height: 5\' 5"  (1.651 m)  Weight: 178 lb 6.4 oz (80.922 kg)  SpO2: 98%    Physical Exam  Constitutional: He is oriented to person, place, and time and well-developed,  well-nourished, and in no distress.  Cardiovascular: Normal rate and regular rhythm.   Pulmonary/Chest: Effort normal and breath sounds normal.  Musculoskeletal:       Lumbar back: He exhibits no tenderness, no pain and no spasm.  Neurological: He is alert and oriented to person, place, and time.  Nursing note and vitals reviewed.      Assessment & Plan  1. DDD (degenerative disc disease), lumbar Lumbar spine x-ray reviewed, we'll start on meloxicam. Follow-up in one month. - meloxicam (MOBIC) 7.5 MG tablet; Take 1 tablet (7.5 mg total) by mouth daily.  Dispense: 30 tablet; Refill: 0  2. Essential hypertension Blood pressure stable and controlled on present therapy.   Lindaann Gradilla Asad A[facility] 07/14/2015 9:35 AM

## 2015-08-17 ENCOUNTER — Ambulatory Visit (INDEPENDENT_AMBULATORY_CARE_PROVIDER_SITE_OTHER): Payer: Medicare Other | Admitting: Family Medicine

## 2015-08-17 ENCOUNTER — Encounter: Payer: Self-pay | Admitting: Family Medicine

## 2015-08-17 VITALS — BP 112/68 | HR 73 | Temp 98.2°F | Resp 15 | Ht 65.0 in | Wt 176.6 lb

## 2015-08-17 DIAGNOSIS — M5136 Other intervertebral disc degeneration, lumbar region: Secondary | ICD-10-CM | POA: Diagnosis not present

## 2015-08-17 MED ORDER — MELOXICAM 7.5 MG PO TABS: 7.5000 mg | ORAL_TABLET | Freq: Every day | ORAL | Status: DC

## 2015-08-17 NOTE — Progress Notes (Signed)
Name: Michael Stevens   MRN: [id number]    DOB: [birth year]   Date:08/17/2015       Progress Note  Subjective  Chief Complaint  Chief Complaint  Patient presents with  . Follow-up    1 mo    Back Pain This is a chronic problem. The problem is unchanged. The pain is present in the lumbar spine. The pain is at a severity of 3/10. The symptoms are aggravated by bending and position. Pertinent negatives include no bladder incontinence or bowel incontinence. He has tried NSAIDs (Aleve and OTC Product ordered over the television.) for the symptoms.    Past Medical History  Diagnosis Date  . Hypertension   . GERD (gastroesophageal reflux disease)     ocassional  . Arthritis     Back  . Anemia   . Glaucoma   . Allergic rhinitis   . Impotence   . Bladder neck obstruction     Past Surgical History  Procedure Laterality Date  . Back surgery  April 2011    Family History  Problem Relation Age of Onset  . Anesthesia problems Neg Hx     Social History   Social History  . Marital Status: Married    Spouse Name: Michael Stevens  . Number of Children: 2  . Years of Education: N/A   Occupational History  . Retired    Social History Main Topics  . Smoking status: Former Smoker -- 25 years    Quit date: 12/01/1982  . Smokeless tobacco: Not on file  . Alcohol Use: 0.6 oz/week    1 Cans of beer per week     Comment: occasional  . Drug Use: No  . Sexual Activity: Not on file   Other Topics Concern  . Not on file   Social History Narrative     Current outpatient prescriptions:  .  brimonidine-timolol (COMBIGAN) 0.2-0.5 % ophthalmic solution, Place 1 drop into both eyes every 12 (twelve) hours.  , Disp: , Rfl:  .  Camphor-Eucalyptus-Menthol (VICKS VAPORUB EX), Apply 1 application topically at bedtime. To clear nasal congestion, Disp: , Rfl:  .  meloxicam (MOBIC) 7.5 MG tablet, Take 1 tablet (7.5 mg total) by mouth daily., Disp: 30 tablet, Rfl: 0 .  omeprazole (PRILOSEC) 40 MG  capsule, Take 1 capsule by mouth  daily, Disp: 90 capsule, Rfl: 1 .  quinapril (ACCUPRIL) 40 MG tablet, Take 1 tablet by mouth at  bedtime, Disp: 90 tablet, Rfl: 1 .  simvastatin (ZOCOR) 40 MG tablet, Take 1 tablet by mouth  every evening, Disp: 90 tablet, Rfl: 1 .  tamsulosin (FLOMAX) 0.4 MG CAPS capsule, Take 1 capsule by mouth  daily, Disp: 90 capsule, Rfl: 1 .  traMADol (ULTRAM) 50 MG tablet, , Disp: , Rfl:   No Known Allergies   Review of Systems  Gastrointestinal: Negative for bowel incontinence.  Genitourinary: Negative for bladder incontinence.  Musculoskeletal: Positive for back pain.    Objective  Filed Vitals:   08/17/15 0949  BP: 112/68  Pulse: 73  Temp: 98.2 F (36.8 C)  TempSrc: Oral  Resp: 15  Height: 5\' 5"  (1.651 m)  Weight: 176 lb 9.6 oz (80.105 kg)  SpO2: 97%    Physical Exam  Musculoskeletal:       Lumbar back: He exhibits tenderness and pain.       Back:     Assessment & Plan  1. DDD (degenerative disc disease), lumbar Advanced degenerative lumbar disc disease, will start on meloxicam  7.5 mg daily, educated on medication adverse effects, and asked to follow-up in one month. - meloxicam (MOBIC) 7.5 MG tablet; Take 1 tablet (7.5 mg total) by mouth daily.  Dispense: 30 tablet; Refill: 0   Mylinh Cragg Asad A[facility] 08/17/2015 9:53 AM

## 2015-09-17 ENCOUNTER — Encounter: Payer: Self-pay | Admitting: Family Medicine

## 2015-09-17 ENCOUNTER — Ambulatory Visit (INDEPENDENT_AMBULATORY_CARE_PROVIDER_SITE_OTHER): Payer: Medicare Other | Admitting: Family Medicine

## 2015-09-17 VITALS — BP 108/61 | HR 74 | Temp 97.8°F | Resp 16 | Ht 65.0 in | Wt 178.8 lb

## 2015-09-17 DIAGNOSIS — M5136 Other intervertebral disc degeneration, lumbar region: Secondary | ICD-10-CM

## 2015-09-17 MED ORDER — MELOXICAM 7.5 MG PO TABS: 7.5000 mg | ORAL_TABLET | Freq: Two times a day (BID) | ORAL | Status: DC

## 2015-09-17 NOTE — Progress Notes (Signed)
Name: Michael Stevens   MRN: [id number]    DOB: 01-29-1941   Date:09/17/2015       Progress Note  Subjective  Chief Complaint  Chief Complaint  Patient presents with  . Follow-up    1 mo  . Anemia  . Gastroesophageal Reflux  . Hyperlipidemia    HPI  Low Back Pain: Pt. Presents for evaluation of low back pain, history of advanced degenerative disc disease, was started on Meloxicam 7.5 mg daily at last OV. He reports that Meloxicam 7.5 mg taken once daily did not help significantly but when he took two tablets (one in AM and one at night), he did feel more relief.    Past Medical History  Diagnosis Date  . Hypertension   . GERD (gastroesophageal reflux disease)     ocassional  . Arthritis     Back  . Anemia   . Glaucoma   . Allergic rhinitis   . Impotence   . Bladder neck obstruction     Past Surgical History  Procedure Laterality Date  . Back surgery  April 2011    Family History  Problem Relation Age of Onset  . Anesthesia problems Neg Hx     Social History   Social History  . Marital Status: Married    Spouse Name: [name]  . Number of Children: 2  . Years of Education: N/A   Occupational History  . Retired    Social History Main Topics  . Smoking status: Former Smoker -- 25 years    Quit date: 12/01/1982  . Smokeless tobacco: Not on file  . Alcohol Use: 0.6 oz/week    1 Cans of beer per week     Comment: occasional  . Drug Use: No  . Sexual Activity: Not on file   Other Topics Concern  . Not on file   Social History Narrative     Current outpatient prescriptions:  .  brimonidine-timolol (COMBIGAN) 0.2-0.5 % ophthalmic solution, Place 1 drop into both eyes every 12 (twelve) hours.  , Disp: , Rfl:  .  Camphor-Eucalyptus-Menthol (VICKS VAPORUB EX), Apply 1 application topically at bedtime. To clear nasal congestion, Disp: , Rfl:  .  meloxicam (MOBIC) 7.5 MG tablet, Take 1 tablet (7.5 mg total) by mouth daily., Disp: 30 tablet, Rfl: 0 .   omeprazole (PRILOSEC) 40 MG capsule, Take 1 capsule by mouth  daily, Disp: 90 capsule, Rfl: 1 .  quinapril (ACCUPRIL) 40 MG tablet, Take 1 tablet by mouth at  bedtime, Disp: 90 tablet, Rfl: 1 .  simvastatin (ZOCOR) 40 MG tablet, Take 1 tablet by mouth  every evening, Disp: 90 tablet, Rfl: 1 .  tamsulosin (FLOMAX) 0.4 MG CAPS capsule, Take 1 capsule by mouth  daily, Disp: 90 capsule, Rfl: 1 .  traMADol (ULTRAM) 50 MG tablet, , Disp: , Rfl:   No Known Allergies   Review of Systems  Musculoskeletal: Positive for back pain.      Objective  Filed Vitals:   09/17/15 0942  BP: 108/61  Pulse: 74  Temp: 97.8 F (36.6 C)  TempSrc: Oral  Resp: 16  Height: 5\' 5"  (1.651 m)  Weight: 178 lb 12.8 oz (81.103 kg)  SpO2: 97%    Physical Exam  Constitutional: He is well-developed, well-nourished, and in no distress.  Cardiovascular: Normal rate and regular rhythm.   Pulmonary/Chest: Effort normal and breath sounds normal.  Musculoskeletal:       Lumbar back: He exhibits tenderness.  Back:  Nursing note and vitals reviewed.     Assessment & Plan  1. DDD (degenerative disc disease), lumbar Increased meloxicam 7.5 mg twice a day, patient to bring copy of MRI at his next visit. Refills provided - meloxicam (MOBIC) 7.5 MG tablet; Take 1 tablet (7.5 mg total) by mouth 2 (two) times daily after a meal.  Dispense: 60 tablet; Refill: 2   Laszlo Ellerby [facility] 09/17/2015 9:57 AM

## 2015-10-28 ENCOUNTER — Other Ambulatory Visit: Payer: Self-pay | Admitting: Family Medicine

## 2015-12-18 ENCOUNTER — Ambulatory Visit: Payer: Medicare Other | Admitting: Family Medicine

## 2015-12-22 ENCOUNTER — Ambulatory Visit (INDEPENDENT_AMBULATORY_CARE_PROVIDER_SITE_OTHER): Payer: Medicare Other | Admitting: Family Medicine

## 2015-12-22 VITALS — BP 118/68 | HR 70 | Temp 97.9°F | Resp 16 | Ht 65.0 in | Wt 170.2 lb

## 2015-12-22 DIAGNOSIS — E7800 Pure hypercholesterolemia, unspecified: Secondary | ICD-10-CM | POA: Diagnosis not present

## 2015-12-22 DIAGNOSIS — K219 Gastro-esophageal reflux disease without esophagitis: Secondary | ICD-10-CM

## 2015-12-22 DIAGNOSIS — I10 Essential (primary) hypertension: Secondary | ICD-10-CM

## 2015-12-22 DIAGNOSIS — R739 Hyperglycemia, unspecified: Secondary | ICD-10-CM | POA: Insufficient documentation

## 2015-12-22 LAB — LIPID PANEL
CHOLESTEROL: 153 mg/dL (ref 125–200)
HDL: 46 mg/dL (ref >=40)
LDL Cholesterol: 88 mg/dL (ref <130)
TRIGLYCERIDES: 93 mg/dL (ref <150)
Total CHOL/HDL Ratio: 3.3 Ratio (ref <=5.0)
VLDL: 19 mg/dL (ref <30)

## 2015-12-22 LAB — COMPLETE METABOLIC PANEL WITH GFR
ALBUMIN: 4.4 g/dL (ref 3.6–5.1)
ALK PHOS: 44 U/L (ref 40–115)
ALT: 11 U/L (ref 9–46)
AST: 16 U/L (ref 10–35)
BILIRUBIN TOTAL: 0.7 mg/dL (ref 0.2–1.2)
BUN: 17 mg/dL (ref 7–25)
CALCIUM: 9.1 mg/dL (ref 8.6–10.3)
CO2: 23 mmol/L (ref 20–31)
CREATININE: 1.2 mg/dL — AB (ref 0.70–1.18)
Chloride: 100 mmol/L (ref 98–110)
GFR, Est African American: 68 mL/min (ref >=60)
GFR, Est Non African American: 59 mL/min — ABNORMAL LOW (ref >=60)
Glucose, Bld: 92 mg/dL (ref 65–99)
Potassium: 4.4 mmol/L (ref 3.5–5.3)
Sodium: 135 mmol/L (ref 135–146)
TOTAL PROTEIN: 6.6 g/dL (ref 6.1–8.1)

## 2015-12-22 MED ORDER — QUINAPRIL HCL 40 MG PO TABS: 40.0000 mg | ORAL_TABLET | Freq: Every day | ORAL | 0 refills | Status: DC

## 2015-12-22 MED ORDER — SIMVASTATIN 40 MG PO TABS: 40.0000 mg | ORAL_TABLET | Freq: Every evening | ORAL | 0 refills | Status: DC

## 2015-12-22 MED ORDER — OMEPRAZOLE 40 MG PO CPDR: 40.0000 mg | DELAYED_RELEASE_CAPSULE | Freq: Every day | ORAL | 0 refills | Status: DC

## 2015-12-22 NOTE — Progress Notes (Signed)
Name: Michael Stevens   MRN: [id number]    DOB: 10/07/1940   Date:12/22/2015       Progress Note  Subjective  Chief Complaint  Chief Complaint  Patient presents with  . Hypertension    pt here for 3 month follow up  . Hyperlipidemia  . Gastroesophageal Reflux    Hypertension  This is a chronic problem. The problem is controlled. Pertinent negatives include no blurred vision, chest pain, headaches, palpitations or shortness of breath. Past treatments include ACE inhibitors. There is no history of kidney disease, CAD/MI or CVA.  Hyperlipidemia  This is a chronic problem. The problem is controlled. Recent lipid tests were reviewed and are normal. Pertinent negatives include no chest pain, myalgias or shortness of breath. Current antihyperlipidemic treatment includes statins.  Gastroesophageal Reflux  He reports no abdominal pain, no chest pain, no dysphagia, no heartburn or no sore throat. This is a chronic problem. He has tried a PPI for the symptoms. Past procedures include an EGD.    Past Medical History:  Diagnosis Date  . Allergic rhinitis   . Anemia   . Arthritis    Back  . Bladder neck obstruction   . GERD (gastroesophageal reflux disease)    ocassional  . Glaucoma   . Hypertension   . Impotence     Past Surgical History:  Procedure Laterality Date  . BACK SURGERY  April 2011    Family History  Problem Relation Age of Onset  . Anesthesia problems Neg Hx     Social History   Social History  . Marital status: Married    Spouse name: [name]  . Number of children: 2  . Years of education: N/A   Occupational History  . Retired    Social History Main Topics  . Smoking status: Former Smoker    Years: 25.00    Quit date: 12/01/1982  . Smokeless tobacco: Not on file  . Alcohol use 0.6 oz/week    1 Cans of beer per week     Comment: occasional  . Drug use: No  . Sexual activity: Not on file   Other Topics Concern  . Not on file   Social History Narrative   . No narrative on file     Current Outpatient Prescriptions:  .  Camphor-Eucalyptus-Menthol (VICKS VAPORUB EX), Apply 1 application topically at bedtime. To clear nasal congestion, Disp: , Rfl:  .  LUMIGAN 0.01 % SOLN, , Disp: , Rfl:  .  omeprazole (PRILOSEC) 40 MG capsule, Take 1 capsule by mouth  daily, Disp: 90 capsule, Rfl: 0 .  quinapril (ACCUPRIL) 40 MG tablet, Take 1 tablet by mouth at  bedtime, Disp: 90 tablet, Rfl: 0 .  simvastatin (ZOCOR) 40 MG tablet, Take 1 tablet by mouth  every evening, Disp: 90 tablet, Rfl: 0 .  tamsulosin (FLOMAX) 0.4 MG CAPS capsule, Take 1 capsule by mouth  daily, Disp: 90 capsule, Rfl: 0 .  traMADol (ULTRAM) 50 MG tablet, , Disp: , Rfl:   No Known Allergies   Review of Systems  HENT: Negative for sore throat.   Eyes: Negative for blurred vision.  Respiratory: Negative for shortness of breath.   Cardiovascular: Negative for chest pain and palpitations.  Gastrointestinal: Negative for abdominal pain, dysphagia and heartburn.  Musculoskeletal: Negative for myalgias.  Neurological: Negative for headaches.    Objective  Vitals:   12/22/15 1107  BP: 118/68  Pulse: 70  Resp: 16  Temp: 97.9 F (36.6 C)  SpO2:  97%  Weight: 170 lb 3 oz (77.2 kg)  Height: 5\' 5"  (1.651 m)    Physical Exam  Constitutional: He is oriented to person, place, and time and well-developed, well-nourished, and in no distress.  HENT:  Head: Normocephalic and atraumatic.  Cardiovascular: Normal rate, regular rhythm and normal heart sounds.   No murmur heard. Pulmonary/Chest: Effort normal and breath sounds normal. He has no wheezes.  Abdominal: Soft. Bowel sounds are normal. There is no tenderness.  Musculoskeletal:       Right ankle: He exhibits no swelling.       Left ankle: He exhibits no swelling.  Neurological: He is alert and oriented to person, place, and time.  Skin: Skin is warm and dry.  Psychiatric: Mood, memory, affect and judgment normal.  Nursing  note and vitals reviewed.    Assessment & Plan  1. Essential hypertension BP stable and controlled on present antihypertensive therapy. - quinapril (ACCUPRIL) 40 MG tablet; Take 1 tablet (40 mg total) by mouth at bedtime.  Dispense: 90 tablet; Refill: 0  2. Hypercholesteremia On statin therapy, repeat FLP. - simvastatin (ZOCOR) 40 MG tablet; Take 1 tablet (40 mg total) by mouth every evening.  Dispense: 90 tablet; Refill: 0 - Lipid Profile - COMPLETE METABOLIC PANEL WITH GFR  3. Gastroesophageal reflux disease, esophagitis presence not specified Symptoms controlled on PPI therapy. - omeprazole (PRILOSEC) 40 MG capsule; Take 1 capsule (40 mg total) by mouth daily.  Dispense: 90 capsule; Refill: 0   Eriel Dunckel [facility] 12/22/2015 11:23 AM

## 2016-02-09 ENCOUNTER — Other Ambulatory Visit: Payer: Self-pay | Admitting: Family Medicine

## 2016-02-09 DIAGNOSIS — I10 Essential (primary) hypertension: Secondary | ICD-10-CM

## 2016-02-09 DIAGNOSIS — E7800 Pure hypercholesterolemia, unspecified: Secondary | ICD-10-CM

## 2016-02-09 DIAGNOSIS — K219 Gastro-esophageal reflux disease without esophagitis: Secondary | ICD-10-CM

## 2016-03-22 ENCOUNTER — Ambulatory Visit (INDEPENDENT_AMBULATORY_CARE_PROVIDER_SITE_OTHER): Payer: Medicare Other | Admitting: Family Medicine

## 2016-03-22 ENCOUNTER — Encounter: Payer: Self-pay | Admitting: Family Medicine

## 2016-03-22 DIAGNOSIS — K219 Gastro-esophageal reflux disease without esophagitis: Secondary | ICD-10-CM | POA: Diagnosis not present

## 2016-03-22 DIAGNOSIS — I10 Essential (primary) hypertension: Secondary | ICD-10-CM

## 2016-03-22 DIAGNOSIS — E7800 Pure hypercholesterolemia, unspecified: Secondary | ICD-10-CM | POA: Diagnosis not present

## 2016-03-22 MED ORDER — OMEPRAZOLE 40 MG PO CPDR: 40.0000 mg | DELAYED_RELEASE_CAPSULE | ORAL | 1 refills | Status: DC

## 2016-03-22 MED ORDER — QUINAPRIL HCL 40 MG PO TABS: 40.0000 mg | ORAL_TABLET | Freq: Every day | ORAL | 1 refills | Status: DC

## 2016-03-22 MED ORDER — SIMVASTATIN 40 MG PO TABS: 40.0000 mg | ORAL_TABLET | Freq: Every evening | ORAL | 1 refills | Status: DC

## 2016-03-22 MED ORDER — SIMVASTATIN 40 MG PO TABS: 40.0000 mg | ORAL_TABLET | Freq: Every day | ORAL | 1 refills | Status: DC

## 2016-03-22 MED ORDER — QUINAPRIL HCL 40 MG PO TABS: 40.0000 mg | ORAL_TABLET | ORAL | 1 refills | Status: DC

## 2016-03-22 MED ORDER — OMEPRAZOLE 40 MG PO CPDR: 40.0000 mg | DELAYED_RELEASE_CAPSULE | Freq: Every day | ORAL | 1 refills | Status: DC

## 2016-03-22 NOTE — Progress Notes (Signed)
Name: Michael Stevens   MRN: [id number]    DOB: 1940-07-21   Date:03/22/2016       Progress Note  Subjective  Chief Complaint  Chief Complaint  Patient presents with  . Follow-up    3 mo    Hypertension  This is a chronic problem. The problem is controlled. Pertinent negatives include no blurred vision, chest pain, headaches, palpitations or shortness of breath. Past treatments include ACE inhibitors. There is no history of kidney disease, CAD/MI or CVA.  Hyperlipidemia  This is a chronic problem. The problem is controlled. Recent lipid tests were reviewed and are normal. Pertinent negatives include no chest pain, myalgias (legs stay 'sore' but thats coming from his back and not a side effect of statin) or shortness of breath. Current antihyperlipidemic treatment includes statins.  Gastroesophageal Reflux  He reports no abdominal pain, no chest pain, no dysphagia, no heartburn, no nausea or no sore throat. This is a chronic problem. The problem has been unchanged. He has tried a PPI for the symptoms. Past procedures include an EGD.    Past Medical History:  Diagnosis Date  . Allergic rhinitis   . Anemia   . Arthritis    Back  . Bladder neck obstruction   . GERD (gastroesophageal reflux disease)    ocassional  . Glaucoma   . Hypertension   . Impotence     Past Surgical History:  Procedure Laterality Date  . BACK SURGERY  April 2011    Family History  Problem Relation Age of Onset  . Anesthesia problems Neg Hx     Social History   Social History  . Marital status: Married    Spouse name: [name]  . Number of children: 2  . Years of education: N/A   Occupational History  . Retired    Social History Main Topics  . Smoking status: Former Smoker    Years: 25.00    Quit date: 12/01/1982  . Smokeless tobacco: Never Used  . Alcohol use 0.6 oz/week    1 Cans of beer per week     Comment: occasional  . Drug use: No  . Sexual activity: Not on file   Other Topics  Concern  . Not on file   Social History Narrative  . No narrative on file     Current Outpatient Prescriptions:  .  Camphor-Eucalyptus-Menthol (VICKS VAPORUB EX), Apply 1 application topically at bedtime. To clear nasal congestion, Disp: , Rfl:  .  latanoprost (XALATAN) 0.005 % ophthalmic solution, , Disp: , Rfl:  .  omeprazole (PRILOSEC) 40 MG capsule, TAKE 1 CAPSULE BY MOUTH  DAILY, Disp: 90 capsule, Rfl: 1 .  quinapril (ACCUPRIL) 40 MG tablet, TAKE 1 TABLET BY MOUTH AT  BEDTIME, Disp: 90 tablet, Rfl: 1 .  simvastatin (ZOCOR) 40 MG tablet, TAKE 1 TABLET BY MOUTH  EVERY EVENING, Disp: 90 tablet, Rfl: 1 .  tamsulosin (FLOMAX) 0.4 MG CAPS capsule, Take 1 capsule by mouth  daily, Disp: 90 capsule, Rfl: 0  No Known Allergies   Review of Systems  HENT: Negative for sore throat.   Eyes: Negative for blurred vision.  Respiratory: Negative for shortness of breath.   Cardiovascular: Negative for chest pain and palpitations.  Gastrointestinal: Negative for abdominal pain, dysphagia, heartburn and nausea.  Musculoskeletal: Negative for myalgias (legs stay 'sore' but thats coming from his back and not a side effect of statin).  Neurological: Negative for headaches.      Objective  Vitals:  03/22/16 0917  BP: 120/68  Pulse: 87  Resp: 16  Temp: 97.7 F (36.5 C)  TempSrc: Oral  SpO2: 97%  Weight: 161 lb 14.4 oz (73.4 kg)  Height: 5\' 5"  (1.651 m)    Physical Exam  Constitutional: He is oriented to person, place, and time and well-developed, well-nourished, and in no distress.  HENT:  Head: Normocephalic and atraumatic.  Cardiovascular: Normal rate, regular rhythm and normal heart sounds.   No murmur heard. Pulmonary/Chest: Effort normal and breath sounds normal. He has no wheezes.  Abdominal: Soft. Bowel sounds are normal. There is no tenderness.  Musculoskeletal:       Right ankle: He exhibits no swelling.       Left ankle: He exhibits no swelling.  Neurological: He is  alert and oriented to person, place, and time.  Skin: Skin is warm and dry.  Psychiatric: Mood, memory, affect and judgment normal.  Nursing note and vitals reviewed.    Assessment & Plan  1. Essential hypertension  - quinapril (ACCUPRIL) 40 MG tablet; Take 1 tablet (40 mg total) by mouth every morning.  Dispense: 90 tablet; Refill: 1  2. Gastroesophageal reflux disease, esophagitis presence not specified  - omeprazole (PRILOSEC) 40 MG capsule; Take 1 capsule (40 mg total) by mouth every morning.  Dispense: 90 capsule; Refill: 1  3. Hypercholesteremia  - simvastatin (ZOCOR) 40 MG tablet; Take 1 tablet (40 mg total) by mouth at bedtime.  Dispense: 90 tablet; Refill: 1   Raven Harmes [facility] 03/22/2016 9:38 AM

## 2016-06-21 ENCOUNTER — Ambulatory Visit (INDEPENDENT_AMBULATORY_CARE_PROVIDER_SITE_OTHER): Payer: Medicare Other | Admitting: Family Medicine

## 2016-06-21 ENCOUNTER — Encounter: Payer: Self-pay | Admitting: Family Medicine

## 2016-06-21 VITALS — BP 120/68 | HR 88 | Temp 98.2°F | Resp 16 | Ht 65.0 in | Wt 162.6 lb

## 2016-06-21 DIAGNOSIS — I10 Essential (primary) hypertension: Secondary | ICD-10-CM

## 2016-06-21 DIAGNOSIS — R21 Rash and other nonspecific skin eruption: Secondary | ICD-10-CM

## 2016-06-21 DIAGNOSIS — N401 Enlarged prostate with lower urinary tract symptoms: Secondary | ICD-10-CM | POA: Diagnosis not present

## 2016-06-21 DIAGNOSIS — E7800 Pure hypercholesterolemia, unspecified: Secondary | ICD-10-CM | POA: Diagnosis not present

## 2016-06-21 DIAGNOSIS — R351 Nocturia: Secondary | ICD-10-CM | POA: Diagnosis not present

## 2016-06-21 LAB — COMPLETE METABOLIC PANEL WITH GFR
ALT: 16 U/L (ref 9–46)
AST: 16 U/L (ref 10–35)
Albumin: 4.2 g/dL (ref 3.6–5.1)
Alkaline Phosphatase: 46 U/L (ref 40–115)
BILIRUBIN TOTAL: 0.5 mg/dL (ref 0.2–1.2)
BUN: 14 mg/dL (ref 7–25)
CO2: 21 mmol/L (ref 20–31)
Calcium: 9.5 mg/dL (ref 8.6–10.3)
Chloride: 103 mmol/L (ref 98–110)
Creat: 1.07 mg/dL (ref 0.70–1.18)
GFR, EST AFRICAN AMERICAN: 78 mL/min (ref >=60)
GFR, EST NON AFRICAN AMERICAN: 68 mL/min (ref >=60)
Glucose, Bld: 93 mg/dL (ref 65–99)
POTASSIUM: 4.2 mmol/L (ref 3.5–5.3)
Sodium: 136 mmol/L (ref 135–146)
TOTAL PROTEIN: 6.6 g/dL (ref 6.1–8.1)

## 2016-06-21 LAB — LIPID PANEL
CHOL/HDL RATIO: 3.4 ratio (ref <5.0)
Cholesterol: 215 mg/dL — ABNORMAL HIGH (ref <200)
HDL: 64 mg/dL (ref >40)
LDL CALC: 133 mg/dL — AB (ref <100)
TRIGLYCERIDES: 91 mg/dL (ref <150)
VLDL: 18 mg/dL (ref <30)

## 2016-06-21 MED ORDER — TAMSULOSIN HCL 0.4 MG PO CAPS: 0.4000 mg | ORAL_CAPSULE | Freq: Every day | ORAL | 0 refills | Status: DC

## 2016-06-21 NOTE — Progress Notes (Signed)
Name: Michael Stevens   MRN: [id number]    DOB: 1940-07-23   Date:06/21/2016       Progress Note  Subjective  Chief Complaint  Chief Complaint  Patient presents with  . Follow-up    3 mo  . Medication Refill    Hyperlipidemia  This is a chronic problem. The problem is controlled. Recent lipid tests were reviewed and are normal. Pertinent negatives include no chest pain, leg pain, myalgias or shortness of breath. Current antihyperlipidemic treatment includes statins.  Benign Prostatic Hypertrophy  This is a chronic problem. Irritative symptoms include nocturia. Obstructive symptoms include a slower stream. Obstructive symptoms do not include dribbling. Pertinent negatives include no chills, dysuria or hematuria. He is not sexually active. Past treatments include tamsulosin.  Hypertension  This is a chronic problem. The problem is unchanged. The problem is controlled. Pertinent negatives include no blurred vision, chest pain, headaches, palpitations or shortness of breath. Past treatments include ACE inhibitors. There is no history of kidney disease, CAD/MI or CVA.    Past Medical History:  Diagnosis Date  . Allergic rhinitis   . Anemia   . Arthritis    Back  . Bladder neck obstruction   . GERD (gastroesophageal reflux disease)    ocassional  . Glaucoma   . Hypertension   . Impotence     Past Surgical History:  Procedure Laterality Date  . BACK SURGERY  April 2011    Family History  Problem Relation Age of Onset  . Anesthesia problems Neg Hx     Social History   Social History  . Marital status: Married    Spouse name: [name]  . Number of children: 2  . Years of education: N/A   Occupational History  . Retired    Social History Main Topics  . Smoking status: Former Smoker    Years: 25.00    Quit date: 12/01/1982  . Smokeless tobacco: Never Used  . Alcohol use 0.6 oz/week    1 Cans of beer per week     Comment: occasional  . Drug use: No  . Sexual activity:  Not on file   Other Topics Concern  . Not on file   Social History Narrative  . No narrative on file     Current Outpatient Prescriptions:  .  Camphor-Eucalyptus-Menthol (VICKS VAPORUB EX), Apply 1 application topically at bedtime. To clear nasal congestion, Disp: , Rfl:  .  latanoprost (XALATAN) 0.005 % ophthalmic solution, , Disp: , Rfl:  .  omeprazole (PRILOSEC) 40 MG capsule, Take 1 capsule (40 mg total) by mouth every morning., Disp: 90 capsule, Rfl: 1 .  quinapril (ACCUPRIL) 40 MG tablet, Take 1 tablet (40 mg total) by mouth every morning., Disp: 90 tablet, Rfl: 1 .  simvastatin (ZOCOR) 40 MG tablet, Take 1 tablet (40 mg total) by mouth at bedtime., Disp: 90 tablet, Rfl: 1 .  tamsulosin (FLOMAX) 0.4 MG CAPS capsule, Take 1 capsule by mouth  daily, Disp: 90 capsule, Rfl: 0  No Known Allergies   Review of Systems  Constitutional: Negative for chills.  Eyes: Negative for blurred vision.  Respiratory: Negative for shortness of breath.   Cardiovascular: Negative for chest pain and palpitations.  Genitourinary: Positive for nocturia. Negative for dysuria and hematuria.  Musculoskeletal: Negative for myalgias.  Neurological: Negative for headaches.    Objective  Vitals:   06/21/16 0948  BP: 120/68  Pulse: 88  Resp: 16  Temp: 98.2 F (36.8 C)  TempSrc: Oral  SpO2: 97%  Weight: 162 lb 9.6 oz (73.8 kg)  Height: 5\' 5"  (1.651 m)    Physical Exam  Constitutional: He is oriented to person, place, and time and well-developed, well-nourished, and in no distress.  HENT:  Head: Normocephalic and atraumatic.  Cardiovascular: Normal rate, regular rhythm and normal heart sounds.   No murmur heard. Pulmonary/Chest: Effort normal and breath sounds normal. He has no wheezes.  Abdominal: Soft. Bowel sounds are normal. There is no tenderness.  Genitourinary: Rectum normal and prostate normal. Prostate is not enlarged and not tender.  Musculoskeletal: He exhibits no edema.    Neurological: He is alert and oriented to person, place, and time.  Skin: Rash noted. Rash is macular.     Psychiatric: Mood, memory, affect and judgment normal.  Nursing note and vitals reviewed.      Assessment & Plan  1. BPH associated with nocturia Stable and responsive to Flomax, prostate is normal in size and consistency. - tamsulosin (FLOMAX) 0.4 MG CAPS capsule; Take 1 capsule (0.4 mg total) by mouth daily.  Dispense: 90 capsule; Refill: 0 - PSA  2. Hypercholesteremia Obtain FLP, continue on statin - COMPLETE METABOLIC PANEL WITH GFR - Lipid Profile  3. Essential hypertension Stable and controlled on present antihypertensive therapy  4. Rash of back Likely fungal, nonpruritic, advised to apply Lotrimin cream.   Bailey Kolbe [facility] 06/21/2016 9:56 AM

## 2016-06-23 LAB — PSA: PSA: 0.6 ng/mL (ref <=4.0)

## 2016-07-25 ENCOUNTER — Ambulatory Visit (INDEPENDENT_AMBULATORY_CARE_PROVIDER_SITE_OTHER): Payer: Medicare Other

## 2016-07-25 VITALS — BP 110/58 | HR 84 | Temp 97.2°F | Ht 65.0 in | Wt 168.6 lb

## 2016-07-25 DIAGNOSIS — Z0000 Encounter for general adult medical examination without abnormal findings: Secondary | ICD-10-CM | POA: Diagnosis not present

## 2016-07-25 NOTE — Progress Notes (Signed)
Subjective:   Michael Stevens is a 76 y.o. male who presents for an Initial Medicare Annual Wellness Visit.  Review of Systems  N/A Cardiac Risk Factors include: advanced age (>64men, >61 women);dyslipidemia;male gender    Objective:    Today's Vitals   07/25/16 1413 07/25/16 1419  BP: (!) 110/58   Pulse: 84   Temp: 97.2 F (36.2 C)   TempSrc: Oral   Weight: 168 lb 9.6 oz (76.5 kg)   Height: 5\' 5"  (1.651 m)   PainSc: 0-No pain 0-No pain   Body mass index is 28.06 kg/m.  Current Medications (verified) Outpatient Encounter Prescriptions as of 07/25/2016  Medication Sig  . Camphor-Eucalyptus-Menthol (VICKS VAPORUB EX) Apply 1 application topically at bedtime. To clear nasal congestion  . latanoprost (XALATAN) 0.005 % ophthalmic solution   . omeprazole (PRILOSEC) 40 MG capsule Take 1 capsule (40 mg total) by mouth every morning.  . quinapril (ACCUPRIL) 40 MG tablet Take 1 tablet (40 mg total) by mouth every morning.  . simvastatin (ZOCOR) 40 MG tablet Take 1 tablet (40 mg total) by mouth at bedtime.  . tamsulosin (FLOMAX) 0.4 MG CAPS capsule Take 1 capsule (0.4 mg total) by mouth daily.   No facility-administered encounter medications on file as of 07/25/2016.     Allergies (verified) Patient has no known allergies.   History: Past Medical History:  Diagnosis Date  . Allergic rhinitis   . Anemia   . Arthritis    Back  . Bladder neck obstruction   . GERD (gastroesophageal reflux disease)    ocassional  . Glaucoma   . Hypertension   . Impotence    Past Surgical History:  Procedure Laterality Date  . BACK SURGERY  April 2011   Family History  Problem Relation Age of Onset  . Anesthesia problems Neg Hx    Social History   Occupational History  . Retired    Social History Main Topics  . Smoking status: Former Smoker    Years: 25.00    Quit date: 12/01/1982  . Smokeless tobacco: Never Used  . Alcohol use 0.6 oz/week    1 Cans of beer per week     Comment:  occasional  . Drug use: No  . Sexual activity: Not on file   Tobacco Counseling Counseling given: Not Answered   Activities of Daily Living In your present state of health, do you have any difficulty performing the following activities: 07/25/2016 06/21/2016  Hearing? N N  Vision? N Y  Difficulty concentrating or making decisions? Y N  Walking or climbing stairs? N N  Dressing or bathing? N N  Doing errands, shopping? N N  Preparing Food and eating ? N -  Using the Toilet? N -  In the past six months, have you accidently leaked urine? N -  Do you have problems with loss of bowel control? N -  Managing your Medications? N -  Managing your Finances? N -  Housekeeping or managing your Housekeeping? N -  Some recent data might be hidden    Immunizations and Health Maintenance Immunization History  Administered Date(s) Administered  . Influenza-Unspecified 01/17/2015, 03/01/2016  . Pneumococcal Polysaccharide-23 04/18/2010   There are no preventive care reminders to display for this patient.  Patient Care Team: Roselee Nova, MD as PCP - General (Family Medicine) Samara Deist, DPM as Referring Physician (Podiatry) Loletta Parish, MD as Referring Physician (Hematology and Oncology) Lupita Raider, DO as Consulting Physician (Optometry) Alford Highland  Pearline Cables as Financial risk analyst Physician (Orthopedic Surgery)  Indicate any recent [place] you may have received from other than [facility] providers in the past year (date may be approximate).    Assessment:   This is a routine wellness examination for Michael Stevens.   Hearing/Vision screen Vision Screening Comments: Pt sees Dr Michael Stevens for vision checks yearly.   Dietary issues and exercise activities discussed: Current Exercise Habits: Home exercise routine, Type of exercise: walking, Time (Minutes): 60, Frequency (Times/Week): 2 (to 3), Weekly Exercise (Minutes/Week): 120, Intensity: Mild, Exercise limited by: Other - see comments (quit due to  back pain)  Goals    . Reduce sugar intake           Continue to decrease sugar intake daily.       Depression Screen PHQ 2/9 Scores 07/25/2016 06/21/2016 03/22/2016 12/22/2015  PHQ - 2 Score 0 0 0 0    Fall Risk Fall Risk  07/25/2016 06/21/2016 03/22/2016 12/22/2015 09/17/2015  Falls in the past year? No No No No No    Cognitive Function:     6CIT Screen 07/25/2016  What Year? 0 points  What month? 0 points  What time? 0 points  Count back from 20 0 points  Months in reverse 2 points  Repeat phrase 2 points  Total Score 4    Screening Tests Health Maintenance  Topic Date Due  . PNA vac Low Risk Adult (2 of 2 - PCV13) 09/16/2016 (Originally 04/19/2011)  . TETANUS/TDAP  04/18/2026 (Originally [birth year]/11/1960)  . INFLUENZA VACCINE  11/16/2016  . COLONOSCOPY  02/28/2024        Plan:  I have personally reviewed and addressed the Medicare Annual Wellness questionnaire and have noted the following in the patient's chart:  A. Medical and social history B. Use of alcohol, tobacco or illicit drugs  C. Current medications and supplements D. Functional ability and status E.  Nutritional status F.  Physical activity G. Advance directives H. List of other physicians I.  Hospitalizations, surgeries, and ER visits in previous 12 months J.  [place] such as hearing and vision if needed, cognitive and depression L. Referrals and appointments - none  In addition, I have reviewed and discussed with patient certain preventive protocols, quality metrics, and best practice recommendations. A written personalized care plan for preventive services as well as general preventive health recommendations were provided to patient.  See attached scanned questionnaire for additional information.   Signed,  Fabio Neighbors, LPN Nurse Health Advisor   MD Recommendations: Pt declined tetanus and Prevnar13 today. Pt is going to get his vaccine record from his pharmacy to confirm if he has received  the [place] and will follow up on this at a future visit.  I, as supervising physician, have reviewed the nurse health advisor's Medicare Wellness Visit note for this patient and concur with the findings and recommendations listed above.  Signed Syed Asad A. Manuella Ghazi MD Attending Physician.

## 2016-07-25 NOTE — Patient Instructions (Signed)
[name] , Thank you for taking time to come for your Medicare Wellness Visit. I appreciate your ongoing commitment to your health goals. Please review the following plan we discussed and let me know if I can assist you in the future.   Screening recommendations/referrals: Colonoscopy: last done 03/11/14 Recommended yearly ophthalmology/optometry visit for glaucoma screening and checkup Recommended yearly dental visit for hygiene and checkup  Vaccinations: Influenza vaccine: done 03/01/16 Pneumococcal vaccine: checking pharmacy record for [place], Pneumovax23 given Tdap vaccine: declined Shingles vaccine: declined   Advanced directives: Requested copy  Next appointment: None  Preventive Care 5 Years and Older, Male Preventive care refers to lifestyle choices and visits with your health care provider that can promote health and wellness. What does preventive care include?  A yearly physical exam. This is also called an annual well check.  Dental exams once or twice a year.  Routine eye exams. Ask your health care provider how often you should have your eyes checked.  Personal lifestyle choices, including:  Daily care of your teeth and gums.  Regular physical activity.  Eating a healthy diet.  Avoiding tobacco and drug use.  Limiting alcohol use.  Practicing safe sex.  Taking low doses of aspirin every day.  Taking vitamin and mineral supplements as recommended by your health care provider. What happens during an annual well check? The services and screenings done by your health care provider during your annual well check will depend on your age, overall health, lifestyle risk factors, and family history of disease. Counseling  Your health care provider may ask you questions about your:  Alcohol use.  Tobacco use.  Drug use.  Emotional well-being.  Home and relationship well-being.  Sexual activity.  Eating habits.  History of falls.  Memory and  ability to understand (cognition).  Work and work Statistician. Screening  You may have the following tests or measurements:  Height, weight, and BMI.  Blood pressure.  Lipid and cholesterol levels. These may be checked every 5 years, or more frequently if you are over 43 years old.  Skin check.  Lung cancer screening. You may have this screening every year starting at age 68 if you have a 30-pack-year history of smoking and currently smoke or have quit within the past 15 years.  Fecal occult blood test (FOBT) of the stool. You may have this test every year starting at age 33.  Flexible sigmoidoscopy or colonoscopy. You may have a sigmoidoscopy every 5 years or a colonoscopy every 10 years starting at age 97.  Prostate cancer screening. Recommendations will vary depending on your family history and other risks.  Hepatitis C blood test.  Hepatitis B blood test.  Sexually transmitted disease (STD) testing.  Diabetes screening. This is done by checking your blood sugar (glucose) after you have not eaten for a while (fasting). You may have this done every 1-3 years.  Abdominal aortic aneurysm (AAA) screening. You may need this if you are a current or former smoker.  Osteoporosis. You may be screened starting at age 6 if you are at high risk. Talk with your health care provider about your test results, treatment options, and if necessary, the need for more tests. Vaccines  Your health care provider may recommend certain vaccines, such as:  Influenza vaccine. This is recommended every year.  Tetanus, diphtheria, and acellular pertussis (Tdap, Td) vaccine. You may need a Td booster every 10 years.  Zoster vaccine. You may need this after age 48.  Pneumococcal 13-valent  conjugate (PCV13) vaccine. One dose is recommended after age 64.  Pneumococcal polysaccharide (PPSV23) vaccine. One dose is recommended after age 50. Talk to your health care provider about which screenings and  vaccines you need and how often you need them. This information is not intended to replace advice given to you by your health care provider. Make sure you discuss any questions you have with your health care provider. Document Released: 05/01/2015 Document Revised: 12/23/2015 Document Reviewed: 02/03/2015 Elsevier Interactive Patient Education  2017 [place] Prevention in the Home Falls can cause injuries. They can happen to people of all ages. There are many things you can do to make your home safe and to help prevent falls. What can I do on the outside of my home?  Regularly fix the edges of walkways and driveways and fix any cracks.  Remove anything that might make you trip as you walk through a door, such as a raised step or threshold.  Trim any bushes or trees on the path to your home.  Use bright outdoor lighting.  Clear any walking paths of anything that might make someone trip, such as rocks or tools.  Regularly check to see if handrails are loose or broken. Make sure that both sides of any steps have handrails.  Any raised decks and porches should have guardrails on the edges.  Have any leaves, snow, or ice cleared regularly.  Use sand or salt on walking paths during winter.  Clean up any spills in your garage right away. This includes oil or grease spills. What can I do in the bathroom?  Use night lights.  Install grab bars by the toilet and in the tub and shower. Do not use towel bars as grab bars.  Use non-skid mats or decals in the tub or shower.  If you need to sit down in the shower, use a plastic, non-slip stool.  Keep the floor dry. Clean up any water that spills on the floor as soon as it happens.  Remove soap buildup in the tub or shower regularly.  Attach bath mats securely with double-sided non-slip rug tape.  Do not have throw rugs and other things on the floor that can make you trip. What can I do in the bedroom?  Use night  lights.  Make sure that you have a light by your bed that is easy to reach.  Do not use any sheets or blankets that are too big for your bed. They should not hang down onto the floor.  Have a firm chair that has side arms. You can use this for support while you get dressed.  Do not have throw rugs and other things on the floor that can make you trip. What can I do in the kitchen?  Clean up any spills right away.  Avoid walking on wet floors.  Keep items that you use a lot in easy-to-reach places.  If you need to reach something above you, use a strong step stool that has a grab bar.  Keep electrical cords out of the way.  Do not use floor polish or wax that makes floors slippery. If you must use wax, use non-skid floor wax.  Do not have throw rugs and other things on the floor that can make you trip. What can I do with my stairs?  Do not leave any items on the stairs.  Make sure that there are handrails on both sides of the stairs and use them. Fix handrails  that are broken or loose. Make sure that handrails are as long as the stairways.  Check any carpeting to make sure that it is firmly attached to the stairs. Fix any carpet that is loose or worn.  Avoid having throw rugs at the top or bottom of the stairs. If you do have throw rugs, attach them to the floor with carpet tape.  Make sure that you have a light switch at the top of the stairs and the bottom of the stairs. If you do not have them, ask someone to add them for you. What else can I do to help prevent falls?  Wear shoes that:  Do not have high heels.  Have rubber bottoms.  Are comfortable and fit you well.  Are closed at the toe. Do not wear sandals.  If you use a stepladder:  Make sure that it is fully opened. Do not climb a closed stepladder.  Make sure that both sides of the stepladder are locked into place.  Ask someone to hold it for you, if possible.  Clearly mark and make sure that you can  see:  Any grab bars or handrails.  First and last steps.  Where the edge of each step is.  Use tools that help you move around (mobility aids) if they are needed. These include:  Canes.  Walkers.  Scooters.  Crutches.  Turn on the lights when you go into a dark area. Replace any light bulbs as soon as they burn out.  Set up your furniture so you have a clear path. Avoid moving your furniture around.  If any of your floors are uneven, fix them.  If there are any pets around you, be aware of where they are.  Review your medicines with your doctor. Some medicines can make you feel dizzy. This can increase your chance of falling. Ask your doctor what other things that you can do to help prevent falls. This information is not intended to replace advice given to you by your health care provider. Make sure you discuss any questions you have with your health care provider. Document Released: 01/29/2009 Document Revised: 09/10/2015 Document Reviewed: 05/09/2014 Elsevier Interactive Patient Education  2017 [employer].

## 2016-08-09 ENCOUNTER — Other Ambulatory Visit: Payer: Self-pay | Admitting: Family Medicine

## 2016-08-09 DIAGNOSIS — R351 Nocturia: Principal | ICD-10-CM

## 2016-08-09 DIAGNOSIS — N401 Enlarged prostate with lower urinary tract symptoms: Secondary | ICD-10-CM

## 2016-10-04 ENCOUNTER — Ambulatory Visit (INDEPENDENT_AMBULATORY_CARE_PROVIDER_SITE_OTHER): Payer: Medicare Other | Admitting: Family Medicine

## 2016-10-04 ENCOUNTER — Encounter: Payer: Self-pay | Admitting: Family Medicine

## 2016-10-04 DIAGNOSIS — E7800 Pure hypercholesterolemia, unspecified: Secondary | ICD-10-CM | POA: Diagnosis not present

## 2016-10-04 DIAGNOSIS — I10 Essential (primary) hypertension: Secondary | ICD-10-CM

## 2016-10-04 DIAGNOSIS — K219 Gastro-esophageal reflux disease without esophagitis: Secondary | ICD-10-CM

## 2016-10-04 MED ORDER — SIMVASTATIN 40 MG PO TABS: 40.0000 mg | ORAL_TABLET | Freq: Every day | ORAL | 1 refills | Status: DC

## 2016-10-04 MED ORDER — OMEPRAZOLE 40 MG PO CPDR: 40.0000 mg | DELAYED_RELEASE_CAPSULE | ORAL | 1 refills | Status: DC

## 2016-10-04 MED ORDER — QUINAPRIL HCL 40 MG PO TABS: 40.0000 mg | ORAL_TABLET | ORAL | 1 refills | Status: DC

## 2016-10-04 NOTE — Progress Notes (Signed)
Name: Michael Stevens   MRN: [id number]    DOB: 1940/04/19   Date:10/04/2016       Progress Note  Subjective  Chief Complaint  Chief Complaint  Patient presents with  . Follow-up    3 mo  . Medication Refill    Hypertension  This is a chronic problem. The problem is controlled. Pertinent negatives include no blurred vision, chest pain, headaches, palpitations or shortness of breath. Past treatments include ACE inhibitors. There is no history of kidney disease, CAD/MI or CVA.  Hyperlipidemia  This is a chronic problem. The problem is uncontrolled. Recent lipid tests were reviewed and are high. Associated symptoms include myalgias. Pertinent negatives include no chest pain or shortness of breath. Current antihyperlipidemic treatment includes statins. Compliance problems: he has not been taking Simvasttain regularly as prescribed, skips days.   Gastroesophageal Reflux  He reports no abdominal pain, no chest pain, no dysphagia, no heartburn, no nausea or no sore throat. This is a chronic problem. The problem has been unchanged. He has tried a PPI for the symptoms. Past procedures include an EGD.      Past Medical History:  Diagnosis Date  . Allergic rhinitis   . Anemia   . Arthritis    Back  . Bladder neck obstruction   . GERD (gastroesophageal reflux disease)    ocassional  . Glaucoma   . Hypertension   . Impotence     Past Surgical History:  Procedure Laterality Date  . BACK SURGERY  April 2011    Family History  Problem Relation Age of Onset  . Anesthesia problems Neg Hx     Social History   Social History  . Marital status: Married    Spouse name: [name]  . Number of children: 2  . Years of education: N/A   Occupational History  . Retired    Social History Main Topics  . Smoking status: Former Smoker    Years: 25.00    Quit date: 12/01/1982  . Smokeless tobacco: Never Used  . Alcohol use 0.6 oz/week    1 Cans of beer per week     Comment: occasional  .  Drug use: No  . Sexual activity: Not on file   Other Topics Concern  . Not on file   Social History Narrative  . No narrative on file     Current Outpatient Prescriptions:  .  Camphor-Eucalyptus-Menthol (VICKS VAPORUB EX), Apply 1 application topically at bedtime. To clear nasal congestion, Disp: , Rfl:  .  latanoprost (XALATAN) 0.005 % ophthalmic solution, , Disp: , Rfl:  .  omeprazole (PRILOSEC) 40 MG capsule, Take 1 capsule (40 mg total) by mouth every morning., Disp: 90 capsule, Rfl: 1 .  quinapril (ACCUPRIL) 40 MG tablet, Take 1 tablet (40 mg total) by mouth every morning., Disp: 90 tablet, Rfl: 1 .  simvastatin (ZOCOR) 40 MG tablet, Take 1 tablet (40 mg total) by mouth at bedtime., Disp: 90 tablet, Rfl: 1 .  tamsulosin (FLOMAX) 0.4 MG CAPS capsule, TAKE 1 CAPSULE BY MOUTH  DAILY, Disp: 90 capsule, Rfl: 0  No Known Allergies   Review of Systems  HENT: Negative for sore throat.   Eyes: Negative for blurred vision.  Respiratory: Negative for shortness of breath.   Cardiovascular: Negative for chest pain and palpitations.  Gastrointestinal: Negative for abdominal pain, dysphagia, heartburn and nausea.  Musculoskeletal: Positive for myalgias.  Neurological: Negative for headaches.    Objective  Vitals:   10/04/16 1006  BP:  118/68  Pulse: 82  Resp: 16  Temp: 97.6 F (36.4 C)  TempSrc: Oral  SpO2: 97%  Weight: 164 lb 14.4 oz (74.8 kg)  Height: 5\' 5"  (1.651 m)    Physical Exam  Constitutional: He is oriented to person, place, and time and well-developed, well-nourished, and in no distress.  HENT:  Head: Normocephalic and atraumatic.  Cardiovascular: Normal rate, regular rhythm and normal heart sounds.   No murmur heard. Pulmonary/Chest: Effort normal and breath sounds normal. He has no wheezes.  Abdominal: Soft. Bowel sounds are normal. There is no tenderness.  Genitourinary: Rectum normal and prostate normal. Prostate is not enlarged and not tender.    Musculoskeletal: He exhibits no edema.  Neurological: He is alert and oriented to person, place, and time.  Skin:     Psychiatric: Mood, memory, affect and judgment normal.  Nursing note and vitals reviewed.      Assessment & Plan  1. Essential hypertension BP stable on present antihypertensive therapy - quinapril (ACCUPRIL) 40 MG tablet; Take 1 tablet (40 mg total) by mouth every morning.  Dispense: 90 tablet; Refill: 1  2. Gastroesophageal reflux disease, esophagitis presence not specified  - omeprazole (PRILOSEC) 40 MG capsule; Take 1 capsule (40 mg total) by mouth every morning.  Dispense: 90 capsule; Refill: 1  3. Hypercholesteremia Patient admits to not taking simvastatin regularly as prescribed, advised to restart on simvastatin every day and we'll obtain FLP at next visit - simvastatin (ZOCOR) 40 MG tablet; Take 1 tablet (40 mg total) by mouth at bedtime.  Dispense: 90 tablet; Refill: 1   Fredda Clarida [facility] 10/04/2016 10:14 AM

## 2017-01-04 ENCOUNTER — Ambulatory Visit (INDEPENDENT_AMBULATORY_CARE_PROVIDER_SITE_OTHER): Payer: Medicare Other | Admitting: Family Medicine

## 2017-01-04 ENCOUNTER — Encounter: Payer: Self-pay | Admitting: Family Medicine

## 2017-01-04 VITALS — BP 120/65 | HR 78 | Temp 98.2°F | Resp 16 | Ht 65.0 in | Wt 168.9 lb

## 2017-01-04 DIAGNOSIS — E7800 Pure hypercholesterolemia, unspecified: Secondary | ICD-10-CM | POA: Diagnosis not present

## 2017-01-04 DIAGNOSIS — I10 Essential (primary) hypertension: Secondary | ICD-10-CM | POA: Diagnosis not present

## 2017-01-04 DIAGNOSIS — N529 Male erectile dysfunction, unspecified: Secondary | ICD-10-CM | POA: Diagnosis not present

## 2017-01-04 DIAGNOSIS — K219 Gastro-esophageal reflux disease without esophagitis: Secondary | ICD-10-CM

## 2017-01-04 LAB — LIPID PANEL
CHOL/HDL RATIO: 3.8 (calc) (ref <5.0)
CHOLESTEROL: 177 mg/dL (ref <200)
HDL: 46 mg/dL (ref >40)
LDL Cholesterol (Calc): 111 mg/dL (calc) — ABNORMAL HIGH
Non-HDL Cholesterol (Calc): 131 mg/dL (calc) — ABNORMAL HIGH (ref <130)
Triglycerides: 102 mg/dL (ref <150)

## 2017-01-04 MED ORDER — SILDENAFIL CITRATE 25 MG PO TABS: 25.0000 mg | ORAL_TABLET | ORAL | 0 refills | Status: DC | PRN

## 2017-01-04 MED ORDER — QUINAPRIL HCL 40 MG PO TABS: 20.0000 mg | ORAL_TABLET | ORAL | 1 refills | Status: DC

## 2017-01-04 NOTE — Progress Notes (Signed)
Name: Michael Stevens   MRN: [id number]    DOB: 22-Apr-1940   Date:01/04/2017       Progress Note  Subjective  Chief Complaint  Chief Complaint  Patient presents with  . Follow-up    3 mo  . Hypertension  . Hyperlipidemia    Hypertension  This is a chronic problem. The problem is controlled. Pertinent negatives include no blurred vision, chest pain, headaches, palpitations or shortness of breath. Past treatments include ACE inhibitors. There is no history of kidney disease, CAD/MI or CVA.  Hyperlipidemia  This is a chronic problem. The problem is uncontrolled. Recent lipid tests were reviewed and are high. Associated symptoms include myalgias. Pertinent negatives include no chest pain or shortness of breath. Current antihyperlipidemic treatment includes statins. Compliance problems: he has not been taking Simvasttain regularly as prescribed, skips days.   Gastroesophageal Reflux  He reports no abdominal pain, no chest pain, no dysphagia, no heartburn, no nausea or no sore throat. This is a chronic problem. The problem has been unchanged. He has tried a PPI for the symptoms. Past procedures include an EGD.  Erectile Dysfunction  This is a recurrent problem. The nature of his difficulty is achieving erection and maintaining erection. Obstructive symptoms do not include dribbling, incomplete emptying, straining or a weak stream. Past treatments include sildenafil.     Past Medical History:  Diagnosis Date  . Allergic rhinitis   . Anemia   . Arthritis    Back  . Bladder neck obstruction   . GERD (gastroesophageal reflux disease)    ocassional  . Glaucoma   . Hypertension   . Impotence     Past Surgical History:  Procedure Laterality Date  . BACK SURGERY  April 2011    Family History  Problem Relation Age of Onset  . Anesthesia problems Neg Hx     Social History   Social History  . Marital status: Married    Spouse name: [name]  . Number of children: 2  . Years of  education: N/A   Occupational History  . Retired    Social History Main Topics  . Smoking status: Former Smoker    Years: 25.00    Quit date: 12/01/1982  . Smokeless tobacco: Never Used  . Alcohol use 0.6 oz/week    1 Cans of beer per week     Comment: occasional  . Drug use: No  . Sexual activity: Not on file   Other Topics Concern  . Not on file   Social History Narrative  . No narrative on file     Current Outpatient Prescriptions:  .  Camphor-Eucalyptus-Menthol (VICKS VAPORUB EX), Apply 1 application topically at bedtime. To clear nasal congestion, Disp: , Rfl:  .  latanoprost (XALATAN) 0.005 % ophthalmic solution, , Disp: , Rfl:  .  omeprazole (PRILOSEC) 40 MG capsule, Take 1 capsule (40 mg total) by mouth every morning., Disp: 90 capsule, Rfl: 1 .  quinapril (ACCUPRIL) 40 MG tablet, Take 1 tablet (40 mg total) by mouth every morning., Disp: 90 tablet, Rfl: 1 .  simvastatin (ZOCOR) 40 MG tablet, Take 1 tablet (40 mg total) by mouth at bedtime., Disp: 90 tablet, Rfl: 1 .  tamsulosin (FLOMAX) 0.4 MG CAPS capsule, TAKE 1 CAPSULE BY MOUTH  DAILY, Disp: 90 capsule, Rfl: 0  No Known Allergies   Review of Systems  HENT: Negative for sore throat.   Eyes: Negative for blurred vision.  Respiratory: Negative for shortness of breath.   Cardiovascular:  Negative for chest pain and palpitations.  Gastrointestinal: Negative for abdominal pain, dysphagia, heartburn and nausea.  Genitourinary: Negative for incomplete emptying.  Musculoskeletal: Positive for myalgias.  Neurological: Negative for headaches.      Objective  Vitals:   01/04/17 1032  BP: 120/65  Pulse: 78  Resp: 16  Temp: 98.2 F (36.8 C)  TempSrc: Oral  SpO2: 96%  Weight: 168 lb 14.4 oz (76.6 kg)  Height: 5\' 5"  (1.651 m)    Physical Exam  Constitutional: He is well-developed, well-nourished, and in no distress.  HENT:  Head: Normocephalic and atraumatic.  Cardiovascular: Normal rate, regular rhythm  and normal heart sounds.   No murmur heard. Pulmonary/Chest: Effort normal and breath sounds normal. He has no wheezes.  Abdominal: Soft. Bowel sounds are normal. There is no tenderness.  Musculoskeletal: He exhibits no edema.  Psychiatric: Mood, memory, affect and judgment normal.  Nursing note and vitals reviewed.    Assessment & Plan  1. Essential hypertension BP stable on present antihypertensive treatment. Advised to decrease quinapril to 20 mg daily, and check blood pressure regularly and follow-up in 3 months - quinapril (ACCUPRIL) 40 MG tablet; Take 0.5 tablets (20 mg total) by mouth every morning.  Dispense: 90 tablet; Refill: 1  2. Gastroesophageal reflux disease, esophagitis presence not specified Stable on PPI  3. Hypercholesteremia  - Lipid panel  4. Erectile dysfunction, unspecified erectile dysfunction type  - sildenafil (VIAGRA) 25 MG tablet; Take 1 tablet (25 mg total) by mouth as needed for erectile dysfunction.  Dispense: 10 tablet; Refill: 0   Brodric Schauer [facility] 01/04/2017 10:48 AM

## 2017-01-11 ENCOUNTER — Other Ambulatory Visit: Payer: Self-pay | Admitting: Family Medicine

## 2017-01-11 DIAGNOSIS — E7800 Pure hypercholesterolemia, unspecified: Secondary | ICD-10-CM

## 2017-01-11 MED ORDER — ATORVASTATIN CALCIUM 20 MG PO TABS: 20.0000 mg | ORAL_TABLET | Freq: Every day | ORAL | 0 refills | Status: DC

## 2017-01-20 ENCOUNTER — Telehealth: Payer: Self-pay | Admitting: Family Medicine

## 2017-01-20 ENCOUNTER — Ambulatory Visit (INDEPENDENT_AMBULATORY_CARE_PROVIDER_SITE_OTHER): Payer: Medicare Other | Admitting: Family Medicine

## 2017-01-20 ENCOUNTER — Encounter: Payer: Self-pay | Admitting: Family Medicine

## 2017-01-20 VITALS — BP 124/70 | HR 86 | Ht 65.0 in | Wt 168.0 lb

## 2017-01-20 DIAGNOSIS — M79672 Pain in left foot: Secondary | ICD-10-CM

## 2017-01-20 DIAGNOSIS — I4510 Unspecified right bundle-branch block: Secondary | ICD-10-CM

## 2017-01-20 DIAGNOSIS — M79671 Pain in right foot: Secondary | ICD-10-CM

## 2017-01-20 DIAGNOSIS — R1013 Epigastric pain: Secondary | ICD-10-CM | POA: Diagnosis not present

## 2017-01-20 LAB — CBC WITH DIFFERENTIAL/PLATELET
BASOS ABS: 103 {cells}/uL (ref 0–200)
Basophils Relative: 1.3 %
Eosinophils Absolute: 340 cells/uL (ref 15–500)
Eosinophils Relative: 4.3 %
HEMATOCRIT: 39.1 % (ref 38.5–50.0)
HEMOGLOBIN: 13.2 g/dL (ref 13.2–17.1)
LYMPHS ABS: 2433 {cells}/uL (ref 850–3900)
MCH: 31.1 pg (ref 27.0–33.0)
MCHC: 33.8 g/dL (ref 32.0–36.0)
MCV: 92.2 fL (ref 80.0–100.0)
MPV: 10 fL (ref 7.5–12.5)
Monocytes Relative: 9.9 %
NEUTROS ABS: 4242 {cells}/uL (ref 1500–7800)
Neutrophils Relative %: 53.7 %
Platelets: 158 10*3/uL (ref 140–400)
RBC: 4.24 10*6/uL (ref 4.20–5.80)
RDW: 16.4 % — ABNORMAL HIGH (ref 11.0–15.0)
Total Lymphocyte: 30.8 %
WBC mixed population: 782 cells/uL (ref 200–950)
WBC: 7.9 10*3/uL (ref 3.8–10.8)

## 2017-01-20 LAB — HEPATIC FUNCTION PANEL
AG RATIO: 1.8 (calc) (ref 1.0–2.5)
ALT: 9 U/L (ref 9–46)
AST: 15 U/L (ref 10–35)
Albumin: 4 g/dL (ref 3.6–5.1)
Alkaline phosphatase (APISO): 48 U/L (ref 40–115)
Bilirubin, Direct: 0.2 mg/dL (ref 0.0–0.2)
GLOBULIN: 2.2 g/dL (ref 1.9–3.7)
Indirect Bilirubin: 0.6 mg/dL (calc) (ref 0.2–1.2)
TOTAL PROTEIN: 6.2 g/dL (ref 6.1–8.1)
Total Bilirubin: 0.8 mg/dL (ref 0.2–1.2)

## 2017-01-20 LAB — LIPASE: Lipase: 62 U/L — ABNORMAL HIGH (ref 7–60)

## 2017-01-20 NOTE — Progress Notes (Signed)
Name: Michael Stevens   MRN: [id number]    DOB: 1940/11/25   Date:01/20/2017       Progress Note  Subjective  Chief Complaint  Chief Complaint  Patient presents with  . Chest Pain    follow up     Chest Pain   This is a recurrent (two episodes in last 30 days, most recent episode last week) problem. The onset quality is gradual. The problem has been resolved. The pain is present in the epigastric region. The pain is at a severity of 7/10. The patient is experiencing no pain. The pain does not radiate. Pertinent negatives include no cough, diaphoresis, exertional chest pressure, malaise/fatigue, nausea, palpitations, syncope or vomiting. He has tried antacids (took PeptoBismol, which helped relieve his pain) for the symptoms. The treatment provided significant relief.  Pertinent negatives for past medical history include no MI.   Schendt is also concerned about pain described as numbness and tingling in his left and right feet, he has history of disc disease of the lumbar spine and believes that the foot pain is running from his low back. Pain is worse with activity, usually worse in the morning. He will like to try gabapentin suggested to him by his friend. He has never seen a foot specialist  Past Medical History:  Diagnosis Date  . Allergic rhinitis   . Anemia   . Arthritis    Back  . Bladder neck obstruction   . GERD (gastroesophageal reflux disease)    ocassional  . Glaucoma   . Hypertension   . Impotence     Past Surgical History:  Procedure Laterality Date  . BACK SURGERY  April 2011    Family History  Problem Relation Age of Onset  . Anesthesia problems Neg Hx     Social History   Social History  . Marital status: Married    Spouse name: [name]  . Number of children: 2  . Years of education: N/A   Occupational History  . Retired    Social History Main Topics  . Smoking status: Former Smoker    Years: 25.00    Quit date: 12/01/1982  . Smokeless tobacco:  Never Used  . Alcohol use 0.6 oz/week    1 Cans of beer per week     Comment: occasional  . Drug use: No  . Sexual activity: Yes   Other Topics Concern  . Not on file   Social History Narrative  . No narrative on file     Current Outpatient Prescriptions:  .  atorvastatin (LIPITOR) 20 MG tablet, Take 1 tablet (20 mg total) by mouth daily., Disp: 90 tablet, Rfl: 0 .  Camphor-Eucalyptus-Menthol (VICKS VAPORUB EX), Apply 1 application topically at bedtime. To clear nasal congestion, Disp: , Rfl:  .  latanoprost (XALATAN) 0.005 % ophthalmic solution, , Disp: , Rfl:  .  omeprazole (PRILOSEC) 40 MG capsule, Take 1 capsule (40 mg total) by mouth every morning., Disp: 90 capsule, Rfl: 1 .  quinapril (ACCUPRIL) 40 MG tablet, Take 0.5 tablets (20 mg total) by mouth every morning., Disp: 90 tablet, Rfl: 1 .  sildenafil (VIAGRA) 25 MG tablet, Take 1 tablet (25 mg total) by mouth as needed for erectile dysfunction., Disp: 10 tablet, Rfl: 0 .  tamsulosin (FLOMAX) 0.4 MG CAPS capsule, TAKE 1 CAPSULE BY MOUTH  DAILY, Disp: 90 capsule, Rfl: 0  No Known Allergies   Review of Systems  Constitutional: Negative for diaphoresis and malaise/fatigue.  Respiratory: Negative for cough.  Cardiovascular: Positive for chest pain. Negative for palpitations and syncope.  Gastrointestinal: Negative for nausea and vomiting.     Objective  Vitals:   01/20/17 0952  BP: 124/70  Pulse: 86  SpO2: 98%  Weight: 168 lb (76.2 kg)  Height: 5\' 5"  (1.651 m)    Physical Exam  Constitutional: He is oriented to person, place, and time and well-developed, well-nourished, and in no distress.  HENT:  Head: Normocephalic and atraumatic.  Cardiovascular: Normal rate, regular rhythm and normal heart sounds.   No murmur heard. Pulmonary/Chest: Effort normal and breath sounds normal. He has no wheezes.  Abdominal: Soft. Bowel sounds are normal. There is no tenderness.  Neurological: He is alert and oriented to  person, place, and time.  Psychiatric: Mood, memory, affect and judgment normal.  Nursing note and vitals reviewed.     Assessment & Plan  1. Epigastric pain We will follow up on lab work, EKG was reviewed today shows right bundle branch block, no prior cardiac evaluation. - EKG 12-Lead - CBC with Differential/Platelet - Lipase - Hepatic function panel  2. RBBB As above, referral to cardiology for workup - Ambulatory referral to Cardiology  3. Bilateral foot pain Advised to follow up with his podiatrist for evaluation first, once that is complete, we'll consider trial of gabapentin, patient verbalized agreement.   Ron Beske [facility] 01/20/2017 10:06 AM

## 2017-01-20 NOTE — Telephone Encounter (Signed)
Patient was seen today by Dr. Manuella Ghazi and was told that Gabapentin would be sent into the [facility] pharmacy.  Patient wants to make sure this is done.

## 2017-01-20 NOTE — Telephone Encounter (Signed)
Spoke with patient advised to follow-up with the podiatrist for evaluation of bilateral foot pain. once he has seen the podiatrist and is still experiencing bilateral foot pain, will consider trial of gabapentin. Patient verbalized agreement with plan

## 2017-01-24 ENCOUNTER — Telehealth: Payer: Self-pay

## 2017-01-24 DIAGNOSIS — R748 Abnormal levels of other serum enzymes: Secondary | ICD-10-CM

## 2017-01-24 NOTE — Telephone Encounter (Signed)
-----   Message from Roselee Nova, MD sent at 01/23/2017  9:52 AM EDT ----- CBC: Normal white count, hemoglobin, hematocrit and platelets Lipase was marginally elevated at 62, if patient is not complaining of any abdominal, epigastric or right upper quadrant pain, has normal appetite, has no nausea or vomiting, he may return in one week to repeat lipase levels Hepatic function panel shows normal bilirubin, liver enzymes, albumin, globulin and total protein

## 2017-01-24 NOTE — Telephone Encounter (Signed)
Called pt informed him of information below. Pt gave verbal understanding and states he will come by one day next week. Lab order placed as future. Pt denies any signs and symptoms listed below.

## 2017-01-26 ENCOUNTER — Encounter: Payer: Self-pay | Admitting: Family Medicine

## 2017-01-30 NOTE — Progress Notes (Signed)
Cardiology Office Note  Date:  01/31/2017   ID:  CONSTANCE HACKENBERG, DOB 09-12-1940, MRN [id number]  PCP:  Roselee Nova, MD   Chief Complaint  Patient presents with  . other    New patient. Referred by PCP for RBBB. Patient states he has had some chest pain but it went away. Meds reviewed verbally with patient.     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 Manuella Ghazi for right bundle branch block, chest pain  On his visit today he reports that he is active, exercises  frequently during the week with a trainer, aerobic and weights/balance Weight is exercise denies any significant shortness of breath or chest pain  On discussion of any chest pain, he denies any symptoms Reports he has been on cholesterol medication for many years Blood pressure typically well controlled  ong discussion of his family history; MOM and DAD died in the 60s, Died from "old age"  EKG personally reviewed by myself on todays visit Shows normal sinus rhythm with rate 76 bpm right bundle branch block no significant ST or T-wave changes   PMH:   has a past medical history of Allergic rhinitis; Anemia; Arthritis; Bladder neck obstruction; GERD (gastroesophageal reflux disease); Glaucoma; Hypertension; and Impotence.  PSH:    Past Surgical History:  Procedure Laterality Date  . BACK SURGERY  April 2011    Current Outpatient Prescriptions  Medication Sig Dispense Refill  . atorvastatin (LIPITOR) 20 MG tablet Take 1 tablet (20 mg total) by mouth daily. 90 tablet 0  . Camphor-Eucalyptus-Menthol (VICKS VAPORUB EX) Apply 1 application topically at bedtime. To clear nasal congestion    . latanoprost (XALATAN) 0.005 % ophthalmic solution     . omeprazole (PRILOSEC) 40 MG capsule Take 1  capsule (40 mg total) by mouth every morning. 90 capsule 1  . quinapril (ACCUPRIL) 40 MG tablet Take 0.5 tablets (20 mg total) by mouth every morning. 90 tablet 1  . sildenafil (VIAGRA) 25 MG tablet Take 1 tablet (25 mg total) by mouth as needed for erectile dysfunction. 10 tablet 0  . tamsulosin (FLOMAX) 0.4 MG CAPS capsule TAKE 1 CAPSULE BY MOUTH  DAILY 90 capsule 0   No current facility-administered medications for this visit.      Allergies:   Patient has no known allergies.   Social History:  The patient  reports that he quit smoking about 34 years ago. He quit after 25.00 years of use. He has never used smokeless tobacco. He reports that he drinks about 0.6 oz of alcohol per week . He reports that he does not use drugs.   Family History:   family history is not on file.    Review of Systems: Review of Systems  Constitutional: Negative.   Respiratory: Negative.   Cardiovascular: Negative.   Gastrointestinal: Negative.   Musculoskeletal: Positive for back pain.  Neurological: Negative.   Psychiatric/Behavioral: Negative.   All other systems reviewed and are negative.    PHYSICAL EXAM: VS:  BP 130/64 (BP Location: Left Arm, Patient Position: Sitting, Cuff Size: Normal)   Pulse 76   Ht 5\' 5"  (1.651 m)   Wt 165 lb 12 oz (75.2 kg)   BMI 27.58 kg/m  , BMI Body mass index is 27.58  kg/m. GEN: Well nourished, well developed, in no acute distress , slow gait HEENT: normal  Neck: no JVD, carotid bruits, or masses Cardiac: RRR; no murmurs, rubs, or gallops,no edema  Respiratory:  clear to auscultation bilaterally, normal work of breathing GI: soft, nontender, nondistended, + BS MS: no deformity or atrophy  Skin: warm and dry, no rash Neuro:  Strength and sensation are intact Psych: euthymic mood, full affect    Recent Labs: 06/21/2016: BUN 14; Creat 1.07; Potassium 4.2; Sodium 136 01/20/2017: ALT 9; Hemoglobin 13.2; Platelets 158    Lipid Panel Lab Results  Component  Value Date   CHOL 177 01/04/2017   HDL 46 01/04/2017   LDLCALC 133 (H) 06/21/2016   TRIG 102 01/04/2017      Wt Readings from Last 3 Encounters:  01/31/17 165 lb 12 oz (75.2 kg)  01/20/17 168 lb (76.2 kg)  01/04/17 168 lb 14.4 oz (76.6 kg)       ASSESSMENT AND PLAN:  Chest pain, unspecified type - Plan: EKG 12-Lead Denies any significant chest pain on exertion, works with a trainer on a regular basis No anginal symptoms No further workup at this time  Abnormal EKG Right bundle branch block Benign in nature. EKG 2013 did not detail right bundle branch block, performed at [facility] As he is asymptomatic no further workup at this time  Chronic thoracic back pain, unspecified back pain laterality Chronic issue dating back several years, had surgery, cortisone Limiting his mobility, he is working with Physiological scientist  Essential hypertension Blood pressure is well controlled on today's visit. No changes made to the medications.  Mixed hyperlipidemia Cholesterol is at goal on the current lipid regimen. No changes to the medications were made. We did review MRI scan of back showing aorta and common iliac arteries that had no aortic atherosclerosis  Disposition:   F/U  As needed  Total encounter time more than 45 minutes  Greater than 50% was spent in counseling and coordination of care with the patient    Orders Placed This Encounter  Procedures  . EKG 12-Lead     Signed, Esmond Plants, M.D., Ph.D. 01/31/2017  [place], [place]

## 2017-01-31 ENCOUNTER — Ambulatory Visit (INDEPENDENT_AMBULATORY_CARE_PROVIDER_SITE_OTHER): Payer: Medicare Other | Admitting: Cardiovascular Disease

## 2017-01-31 ENCOUNTER — Other Ambulatory Visit: Payer: Self-pay

## 2017-01-31 ENCOUNTER — Encounter: Payer: Self-pay | Admitting: Cardiovascular Disease

## 2017-01-31 VITALS — BP 130/64 | HR 76 | Ht 65.0 in | Wt 165.8 lb

## 2017-01-31 DIAGNOSIS — R9431 Abnormal electrocardiogram [ECG] [EKG]: Secondary | ICD-10-CM

## 2017-01-31 DIAGNOSIS — I4510 Unspecified right bundle-branch block: Secondary | ICD-10-CM

## 2017-01-31 DIAGNOSIS — M546 Pain in thoracic spine: Secondary | ICD-10-CM | POA: Diagnosis not present

## 2017-01-31 DIAGNOSIS — R079 Chest pain, unspecified: Secondary | ICD-10-CM | POA: Diagnosis not present

## 2017-01-31 DIAGNOSIS — G8929 Other chronic pain: Secondary | ICD-10-CM | POA: Diagnosis not present

## 2017-01-31 DIAGNOSIS — R748 Abnormal levels of other serum enzymes: Secondary | ICD-10-CM

## 2017-01-31 DIAGNOSIS — I10 Essential (primary) hypertension: Secondary | ICD-10-CM

## 2017-01-31 DIAGNOSIS — E782 Mixed hyperlipidemia: Secondary | ICD-10-CM

## 2017-01-31 LAB — LIPASE: Lipase: 38 U/L (ref 7–60)

## 2017-01-31 NOTE — Patient Instructions (Addendum)
Medication Instructions:   No medication changes made  Labwork:  No new labs needed  Testing/Procedures:  No further testing at this time   Follow-Up: It was a pleasure seeing you in the office today. Please call us if you have new issues that need to be addressed before your next appt.  [phone]  Your physician wants you to follow-up in:  As needed  If you need a refill on your cardiac medications before your next appointment, please call your pharmacy.     

## 2017-02-28 ENCOUNTER — Ambulatory Visit: Payer: Self-pay | Admitting: Cardiovascular Disease

## 2017-03-07 ENCOUNTER — Ambulatory Visit: Payer: Medicare Other | Admitting: Family Medicine

## 2017-03-14 ENCOUNTER — Encounter: Payer: Self-pay | Admitting: Family Medicine

## 2017-03-14 ENCOUNTER — Ambulatory Visit (INDEPENDENT_AMBULATORY_CARE_PROVIDER_SITE_OTHER): Payer: Medicare Other | Admitting: Family Medicine

## 2017-03-14 ENCOUNTER — Other Ambulatory Visit: Payer: Self-pay | Admitting: Family Medicine

## 2017-03-14 VITALS — BP 126/70 | HR 73 | Temp 97.9°F | Resp 16 | Wt 169.2 lb

## 2017-03-14 DIAGNOSIS — I10 Essential (primary) hypertension: Secondary | ICD-10-CM

## 2017-03-14 DIAGNOSIS — E7800 Pure hypercholesterolemia, unspecified: Secondary | ICD-10-CM | POA: Diagnosis not present

## 2017-03-14 DIAGNOSIS — K219 Gastro-esophageal reflux disease without esophagitis: Secondary | ICD-10-CM | POA: Diagnosis not present

## 2017-03-14 NOTE — Progress Notes (Signed)
Name: Michael Stevens   MRN: [id number]    DOB: Apr 14, 1941   Date:03/14/2017       Progress Note  Subjective  Chief Complaint  Chief Complaint  Patient presents with  . Hyperlipidemia    Havent took any cholesterol meds; pt was confused on which meds to take.   Marland Kitchen Hypertension    Hyperlipidemia  This is a chronic problem. The problem is uncontrolled. Recent lipid tests were reviewed and are high. Pertinent negatives include no chest pain, leg pain, myalgias or shortness of breath. Current antihyperlipidemic treatment includes statins (has not taken Atorvastatin prescribed in September 2018.).  Hypertension  This is a chronic problem. The problem is unchanged. The problem is controlled. Pertinent negatives include no blurred vision, chest pain, headaches, palpitations or shortness of breath. Past treatments include ACE inhibitors. There is no history of kidney disease, CAD/MI or CVA.  Gastroesophageal Reflux  He reports no abdominal pain, no belching, no chest pain, no heartburn or no nausea. This is a chronic problem. He has tried a PPI for the symptoms. Past procedures do not include an EGD.     Past Medical History:  Diagnosis Date  . Allergic rhinitis   . Anemia   . Arthritis    Back  . Bladder neck obstruction   . GERD (gastroesophageal reflux disease)    ocassional  . Glaucoma   . Hypertension   . Impotence     Past Surgical History:  Procedure Laterality Date  . BACK SURGERY  April 2011    Family History  Problem Relation Age of Onset  . Anesthesia problems Neg Hx     Social History   Socioeconomic History  . Marital status: Married    Spouse name: Michael Stevens  . Number of children: 2  . Years of education: Not on file  . Highest education level: Not on file  Social Needs  . Financial resource strain: Not on file  . Food insecurity - worry: Not on file  . Food insecurity - inability: Not on file  . Transportation needs - medical: Not on file  . Transportation  needs - non-medical: Not on file  Occupational History  . Occupation: Retired  Tobacco Use  . Smoking status: Former Smoker    Years: 25.00    Last attempt to quit: 12/01/1982    Years since quitting: 34.3  . Smokeless tobacco: Never Used  Substance and Sexual Activity  . Alcohol use: Yes    Alcohol/week: 0.6 oz    Types: 1 Cans of beer per week    Comment: occasional  . Drug use: No  . Sexual activity: Yes  Other Topics Concern  . Not on file  Social History Narrative  . Not on file     Current Outpatient Medications:  .  Camphor-Eucalyptus-Menthol (VICKS VAPORUB EX), Apply 1 application topically at bedtime. To clear nasal congestion, Disp: , Rfl:  .  latanoprost (XALATAN) 0.005 % ophthalmic solution, , Disp: , Rfl:  .  omeprazole (PRILOSEC) 40 MG capsule, Take 1 capsule (40 mg total) by mouth every morning. (Patient taking differently: Take 20 mg by mouth every morning. ), Disp: 90 capsule, Rfl: 1 .  quinapril (ACCUPRIL) 40 MG tablet, Take 0.5 tablets (20 mg total) by mouth every morning., Disp: 90 tablet, Rfl: 1 .  sildenafil (VIAGRA) 25 MG tablet, Take 1 tablet (25 mg total) by mouth as needed for erectile dysfunction., Disp: 10 tablet, Rfl: 0 .  atorvastatin (LIPITOR) 20 MG tablet, Take  1 tablet (20 mg total) by mouth daily. (Patient not taking: Reported on 03/14/2017), Disp: 90 tablet, Rfl: 0 .  tamsulosin (FLOMAX) 0.4 MG CAPS capsule, TAKE 1 CAPSULE BY MOUTH  DAILY (Patient not taking: Reported on 03/14/2017), Disp: 90 capsule, Rfl: 0  No Known Allergies   Review of Systems  Eyes: Negative for blurred vision.  Respiratory: Negative for shortness of breath.   Cardiovascular: Negative for chest pain and palpitations.  Gastrointestinal: Negative for abdominal pain, heartburn and nausea.  Musculoskeletal: Negative for myalgias.  Neurological: Negative for headaches.     Objective  Vitals:   03/14/17 1035  BP: 126/70  Pulse: 73  Resp: 16  Temp: 97.9 F (36.6 C)   TempSrc: Oral  SpO2: 97%  Weight: 169 lb 3.2 oz (76.7 kg)    Physical Exam  Constitutional: He is oriented to person, place, and time and well-developed, well-nourished, and in no distress.  HENT:  Head: Normocephalic and atraumatic.  Cardiovascular: Normal rate, regular rhythm and normal heart sounds.  No murmur heard. Pulmonary/Chest: Effort normal and breath sounds normal. He has no wheezes.  Abdominal: Soft. Bowel sounds are normal. There is no tenderness.  Neurological: He is alert and oriented to person, place, and time.  Psychiatric: Mood, memory, affect and judgment normal.  Nursing note and vitals reviewed.      Recent Results (from the past 2160 hour(s))  Lipid panel     Status: Abnormal   Collection Time: 01/04/17 10:51 AM  Result Value Ref Range   Cholesterol 177 <200 mg/dL   HDL 46 >40 mg/dL   Triglycerides 102 <150 mg/dL   LDL Cholesterol (Calc) 111 (H) mg/dL (calc)    Comment: Reference range: <100 . Desirable range <100 mg/dL for primary prevention;   <70 mg/dL for patients with CHD or diabetic patients  with > or = 2 CHD risk factors. Marland Kitchen LDL-C is now calculated using the Martin-Hopkins  calculation, which is a validated novel method providing  better accuracy than the Friedewald equation in the  estimation of LDL-C.  Cresenciano Genre et al. Annamaria Helling. 8119;147(82): 2061-2068  (http://education.[facility].com/faq/FAQ164)    Total CHOL/HDL Ratio 3.8 <5.0 (calc)   Non-HDL Cholesterol (Calc) 131 (H) <130 mg/dL (calc)    Comment: For patients with diabetes plus 1 major ASCVD risk  factor, treating to a non-HDL-C goal of <100 mg/dL  (LDL-C of <70 mg/dL) is considered a therapeutic  option.   CBC with Differential/Platelet     Status: Abnormal   Collection Time: 01/20/17 10:34 AM  Result Value Ref Range   WBC 7.9 3.8 - 10.8 Thousand/uL   RBC 4.24 4.20 - 5.80 Million/uL   Hemoglobin 13.2 13.2 - 17.1 g/dL   HCT 39.1 38.5 - 50.0 %   MCV 92.2 80.0 - 100.0 fL    MCH 31.1 27.0 - 33.0 pg   MCHC 33.8 32.0 - 36.0 g/dL   RDW 16.4 (H) 11.0 - 15.0 %   Platelets 158 140 - 400 Thousand/uL   MPV 10.0 7.5 - 12.5 fL   Neutro Abs 4,242 1,500 - 7,800 cells/uL   Lymphs Abs 2,433 850 - 3,900 cells/uL   WBC mixed population 782 200 - 950 cells/uL   Eosinophils Absolute 340 15 - 500 cells/uL   Basophils Absolute 103 0 - 200 cells/uL   Neutrophils Relative % 53.7 %   Total Lymphocyte 30.8 %   Monocytes Relative 9.9 %   Eosinophils Relative 4.3 %   Basophils Relative 1.3 %  Lipase  Status: Abnormal   Collection Time: 01/20/17 10:34 AM  Result Value Ref Range   Lipase 62 (H) 7 - 60 U/L  Hepatic function panel     Status: None   Collection Time: 01/20/17 10:34 AM  Result Value Ref Range   Total Protein 6.2 6.1 - 8.1 g/dL   Albumin 4.0 3.6 - 5.1 g/dL   Globulin 2.2 1.9 - 3.7 g/dL (calc)   AG Ratio 1.8 1.0 - 2.5 (calc)   Total Bilirubin 0.8 0.2 - 1.2 mg/dL   Bilirubin, Direct 0.2 0.0 - 0.2 mg/dL   Indirect Bilirubin 0.6 0.2 - 1.2 mg/dL (calc)   Alkaline phosphatase (APISO) 48 40 - 115 U/L   AST 15 10 - 35 U/L   ALT 9 9 - 46 U/L  Lipase     Status: None   Collection Time: 01/31/17 10:53 AM  Result Value Ref Range   Lipase 38 7 - 60 U/L     Assessment & Plan  1. Essential hypertension BP stable on present antihypertensive treatment  2. Gastroesophageal reflux disease, esophagitis presence not specified Symptoms of acid reflux are stable on PPI  3. Hypercholesteremia Patient will restart taking atorvastatin and obtain FLP in 3 months   Mackenzie [facility] 03/14/2017 10:46 AM

## 2017-03-24 ENCOUNTER — Ambulatory Visit (INDEPENDENT_AMBULATORY_CARE_PROVIDER_SITE_OTHER): Payer: Medicare Other

## 2017-03-24 VITALS — BP 110/60 | HR 78 | Temp 97.3°F | Resp 16 | Ht 65.0 in | Wt 170.4 lb

## 2017-03-24 DIAGNOSIS — Z23 Encounter for immunization: Secondary | ICD-10-CM | POA: Diagnosis not present

## 2017-03-24 DIAGNOSIS — Z0000 Encounter for general adult medical examination without abnormal findings: Secondary | ICD-10-CM | POA: Diagnosis not present

## 2017-03-24 NOTE — Progress Notes (Signed)
Subjective:   Michael Stevens is a 76 y.o. male who presents for Medicare Annual/Subsequent preventive examination.  Review of Systems:  N/A       Objective:    Vitals: BP 110/60 (BP Location: Left Arm, Patient Position: Sitting, Cuff Size: Normal)   Pulse 78   Temp (!) 97.3 F (36.3 C) (Oral)   Resp 16   Ht 5\' 5"  (1.651 m)   Wt 170 lb 6.4 oz (77.3 kg)   BMI 28.36 kg/m   Body mass index is 28.36 kg/m.  Advanced Directives 03/24/2017 10/04/2016 07/25/2016 06/21/2016 03/22/2016 09/17/2015 08/17/2015  Does Patient Have a Medical Advance Directive? No No Yes No Yes No No  Type of Advance Directive - - Living will;Healthcare Power of Attorney - Living will - -  Does patient want to make changes to medical advance directive? - - - - - - -  Copy of [place] in Chart? - - No - copy requested - - - -  Would patient like information on creating a medical advance directive? Yes (MAU/Ambulatory/Procedural Areas - Information given) - - - - No - patient declined information No - patient declined information  Pre-existing out of facility DNR order (yellow form or pink MOST form) - - - - - - -    Tobacco Social History   Tobacco Use  Smoking Status Former Smoker  . Packs/day: 2.00  . Years: 30.00  . Pack years: 60.00  . Types: Cigarettes  . Last attempt to quit: 12/01/1982  . Years since quitting: 34.3  Smokeless Tobacco Never Used  Tobacco Comment   Quit 34 years ago     Counseling given: No Comment: Quit 34 years ago   Clinical Intake:  Pre-visit preparation completed: Yes  Pain : No/denies pain     BMI - recorded: 28.36 Nutritional Status: BMI 25 -29 Overweight Nutritional Risks: None Diabetes: No  Activities of Daily Living: Independent Ambulation: Independent with device- listed below Home Assistive Devices/Equipment: Eyeglasses, Shower/tub chair Medication Administration: Independent Home Management: Independent  Barriers to Care Management &  Learning: None  Do you feel unsafe in your current relationship?: No(married) Do you feel physically threatened by others?: No Anyone hurting you at home, work, or school?: No  How often do you need to have someone help you when you read instructions, pamphlets, or other written materials from your doctor or pharmacy?: 1 - Never What is the last grade level you completed in school?: 12th grade  Interpreter Needed?: No  Information entered by :: AEversole, LPN  Past Medical History:  Diagnosis Date  . Allergic rhinitis   . Anemia   . Arthritis    Back  . Bladder neck obstruction   . GERD (gastroesophageal reflux disease)    ocassional  . Glaucoma   . Hypertension   . Impotence    Past Surgical History:  Procedure Laterality Date  . BACK SURGERY  April 2011   Family History  Problem Relation Age of Onset  . Healthy Mother   . Healthy Brother    Social History   Socioeconomic History  . Marital status: Married    Spouse name: [name]  . Number of children: 2  . Years of education: None  . Highest education level: None  Social Needs  . Financial resource strain: Not hard at all  . Food insecurity - worry: Never true  . Food insecurity - inability: Never true  . Transportation needs - medical: No  .  Transportation needs - non-medical: No  Occupational History  . Occupation: Retired  Tobacco Use  . Smoking status: Former Smoker    Packs/day: 2.00    Years: 30.00    Pack years: 60.00    Types: Cigarettes    Last attempt to quit: 12/01/1982    Years since quitting: 34.3  . Smokeless tobacco: Never Used  . Tobacco comment: Quit 34 years ago  Substance and Sexual Activity  . Alcohol use: Yes    Alcohol/week: 1.2 - 1.8 oz    Types: 2 - 3 Cans of beer per week    Comment: occasional  . Drug use: No  . Sexual activity: No  Other Topics Concern  . None  Social History Narrative  . None    Outpatient Encounter Medications as of 03/24/2017  Medication Sig  .  atorvastatin (LIPITOR) 20 MG tablet TAKE 1 TABLET BY MOUTH EVERY DAY  . Camphor-Eucalyptus-Menthol (VICKS VAPORUB EX) Apply 1 application topically at bedtime. To clear nasal congestion  . latanoprost (XALATAN) 0.005 % ophthalmic solution Place into both eyes at bedtime.   Marland Kitchen omeprazole (PRILOSEC) 40 MG capsule Take 1 capsule (40 mg total) by mouth every morning. (Patient taking differently: Take 20 mg by mouth every morning. )  . quinapril (ACCUPRIL) 40 MG tablet Take 0.5 tablets (20 mg total) by mouth every morning.  . sildenafil (VIAGRA) 25 MG tablet Take 1 tablet (25 mg total) by mouth as needed for erectile dysfunction.  . tamsulosin (FLOMAX) 0.4 MG CAPS capsule TAKE 1 CAPSULE BY MOUTH  DAILY   No facility-administered encounter medications on file as of 03/24/2017.     Activities of Daily Living In your present state of health, do you have any difficulty performing the following activities: 03/24/2017 03/14/2017  Hearing? N N  Vision? N Y  Comment - -  Difficulty concentrating or making decisions? Y N  Comment short term memory loss -  Walking or climbing stairs? Y N  Comment back pain, foot pain -  Dressing or bathing? N N  Doing errands, shopping? N N  Preparing Food and eating ? N -  Using the Toilet? N -  In the past six months, have you accidently leaked urine? N -  Do you have problems with loss of bowel control? N -  Managing your Medications? N -  Managing your Finances? N -  Housekeeping or managing your Housekeeping? N -  Some recent data might be hidden    Timed Get Up and Go Performed: Yes. 10 sec. No intervention required  Patient Care Team: Roselee Nova, MD as PCP - General (Family Medicine) Samara Deist, DPM as Referring Physician (Podiatry) Loletta Parish, MD as Referring Physician (Hematology and Oncology)   Assessment:   Exercise Activities and Dietary recommendations Current Exercise Habits: Structured exercise class, Type of exercise:  walking;strength training/weights, Time (Minutes): 60, Frequency (Times/Week): 4, Weekly Exercise (Minutes/Week): 240, Intensity: Mild, Exercise limited by: orthopedic condition(s)(foot pain and back pain)  Goals    . Prevent falls     Recommend to remove items from home that may cause slips or trips.      Fall Risk Fall Risk  03/24/2017 03/14/2017 01/04/2017 10/04/2016 07/25/2016  Falls in the past year? No No No No No  Risk for fall due to : Other (Comment) - - - -  Risk for fall due to: Comment wears eye glasses - - - -   Is the patient's home free of loose throw rugs  in walkways, pet beds, electrical cords, etc?   yes      Grab bars in the bathroom? yes      Handrails on the stairs?   yes      Adequate lighting?   yes Depression Screen PHQ 2/9 Scores 03/24/2017 03/14/2017 01/04/2017 10/04/2016  PHQ - 2 Score 0 0 0 0    Cognitive Function     6CIT Screen 03/24/2017 07/25/2016  What Year? 4 points 0 points  What month? 0 points 0 points  What time? 0 points 0 points  Count back from 20 0 points 0 points  Months in reverse 0 points 2 points  Repeat phrase 6 points 2 points  Total Score 10 4    Immunization History  Administered Date(s) Administered  . Influenza-Unspecified 01/17/2015, 03/01/2016  . Pneumococcal Conjugate-13 03/24/2017  . Pneumococcal Polysaccharide-23 04/18/2010   Due for Tdap. Declined my offer to administer today. Pt advised to call his insurance company to determine his out of pocket expense. Advised he may also receive this vaccine at his local pharmacy or Health Dept.  Due for Shingles vaccine. Education about Shingrix provided according to [facility] recommendations. Pt advised to call his insurance company to determine his out of pocket expense. Advised he may also receive this vaccine at his local pharmacy or Health Dept.  Screening Tests Health Maintenance  Topic Date Due  . TETANUS/TDAP  04/18/2026 (Originally 01/11/1960)  . INFLUENZA VACCINE  Completed  .  PNA vac Low Risk Adult  Completed   Cancer Screenings: Lung: Low Dose CT Chest recommended if Age 26-80 years, 30 pack-year currently smoking OR have quit w/in 15years. Patient does qualify. Declined my offer to screen for lung cancer. Education provided regarding the importance of this screening. Verbalized acceptance and understanding but still declined. Colorectal: Completed 03/11/14. Repeat colonoscopy in 10 years  Additional Screenings: HIV Screening: does not qualify Hepatitis C Screening: does not qualify    Plan:   I have personally reviewed and addressed the Medicare Annual Wellness questionnaire and have noted the following in the patient's chart:  A. Medical and social history B. Use of alcohol, tobacco or illicit drugs  C. Current medications and supplements D. Functional ability and status E.  Nutritional status F.  Physical activity G. Advance directives and Code Status: Pt provided with MOST and DNR documents for his/her review. Pt has been advised that he/she will only need to complete the document that is most appropriate to suit his/her health care wishes. Once completed, pt has been advised to return the appropriate document to our office for the physician to review and sign. H. List of other physicians I.  Hospitalizations, surgeries, and ER visits in previous 12 months J.  [place] such as hearing and vision if needed, cognitive and depression L. Referrals and appointments - none  In addition, I have reviewed and discussed with patient certain preventive protocols, quality metrics, and best practice recommendations. A written personalized care plan for preventive services as well as general preventive health recommendations were provided to patient.  Signed,  Aleatha Borer, LPN Nurse Health Advisor

## 2017-03-24 NOTE — Patient Instructions (Signed)
[name] , Thank you for taking time to come for your Medicare Wellness Visit. I appreciate your ongoing commitment to your health goals. Please review the following plan we discussed and let me know if I can assist you in the future.   Screening recommendations/referrals: Colonoscopy: Completed 03/11/14. Repeat colonoscopy in 10 years Recommended yearly ophthalmology/optometry visit for glaucoma screening and checkup Recommended yearly dental visit for hygiene and checkup  Vaccinations: Influenza vaccine: Completed 02/06/17 Pneumococcal vaccine: Completed PPSV23 04/18/10. PCV13 given today Tdap vaccine: Declined. Please call your insurance company to determine your out of pocket expense. Shingles vaccine: Declined. Please call your insurance company to determine your out of pocket expense.    Advanced directives: Advance directive discussed with you today. I have provided a copy for you to complete at home and have notarized. Once this is complete please bring a copy in to our office so we can scan it into your chart.  Conditions/risks identified: Fall risk prevention discused  Next appointment: You are scheduled to see Dr. Sanda Klein on 06/13/17 @ 9:40am.   Please schedule your annual wellness exam with your Nurse Health Advisor in one year.  Preventive Care 70 Years and Older, Male Preventive care refers to lifestyle choices and visits with your health care provider that can promote health and wellness. What does preventive care include?  A yearly physical exam. This is also called an annual well check.  Dental exams once or twice a year.  Routine eye exams. Ask your health care provider how often you should have your eyes checked.  Personal lifestyle choices, including:  Daily care of your teeth and gums.  Regular physical activity.  Eating a healthy diet.  Avoiding tobacco and drug use.  Limiting alcohol use.  Practicing safe sex.  Taking low doses of aspirin every  day.  Taking vitamin and mineral supplements as recommended by your health care provider. What happens during an annual well check? The services and screenings done by your health care provider during your annual well check will depend on your age, overall health, lifestyle risk factors, and family history of disease. Counseling  Your health care provider may ask you questions about your:  Alcohol use.  Tobacco use.  Drug use.  Emotional well-being.  Home and relationship well-being.  Sexual activity.  Eating habits.  History of falls.  Memory and ability to understand (cognition).  Work and work Statistician. Screening  You may have the following tests or measurements:  Height, weight, and BMI.  Blood pressure.  Lipid and cholesterol levels. These may be checked every 5 years, or more frequently if you are over 71 years old.  Skin check.  Lung cancer screening. You may have this screening every year starting at age 108 if you have a 30-pack-year history of smoking and currently smoke or have quit within the past 15 years.  Fecal occult blood test (FOBT) of the stool. You may have this test every year starting at age 69.  Flexible sigmoidoscopy or colonoscopy. You may have a sigmoidoscopy every 5 years or a colonoscopy every 10 years starting at age 33.  Prostate cancer screening. Recommendations will vary depending on your family history and other risks.  Hepatitis C blood test.  Hepatitis B blood test.  Sexually transmitted disease (STD) testing.  Diabetes screening. This is done by checking your blood sugar (glucose) after you have not eaten for a while (fasting). You may have this done every 1-3 years.  Abdominal aortic aneurysm (AAA) screening.  You may need this if you are a current or former smoker.  Osteoporosis. You may be screened starting at age 22 if you are at high risk. Talk with your health care provider about your test results, treatment options,  and if necessary, the need for more tests. Vaccines  Your health care provider may recommend certain vaccines, such as:  Influenza vaccine. This is recommended every year.  Tetanus, diphtheria, and acellular pertussis (Tdap, Td) vaccine. You may need a Td booster every 10 years.  Zoster vaccine. You may need this after age 4.  Pneumococcal 13-valent conjugate (PCV13) vaccine. One dose is recommended after age 4.  Pneumococcal polysaccharide (PPSV23) vaccine. One dose is recommended after age 92. Talk to your health care provider about which screenings and vaccines you need and how often you need them. This information is not intended to replace advice given to you by your health care provider. Make sure you discuss any questions you have with your health care provider. Document Released: 05/01/2015 Document Revised: 12/23/2015 Document Reviewed: 02/03/2015 Elsevier Interactive Patient Education  2017 [place] Prevention in the Home Falls can cause injuries. They can happen to people of all ages. There are many things you can do to make your home safe and to help prevent falls. What can I do on the outside of my home?  Regularly fix the edges of walkways and driveways and fix any cracks.  Remove anything that might make you trip as you walk through a door, such as a raised step or threshold.  Trim any bushes or trees on the path to your home.  Use bright outdoor lighting.  Clear any walking paths of anything that might make someone trip, such as rocks or tools.  Regularly check to see if handrails are loose or broken. Make sure that both sides of any steps have handrails.  Any raised decks and porches should have guardrails on the edges.  Have any leaves, snow, or ice cleared regularly.  Use sand or salt on walking paths during winter.  Clean up any spills in your garage right away. This includes oil or grease spills. What can I do in the bathroom?  Use night  lights.  Install grab bars by the toilet and in the tub and shower. Do not use towel bars as grab bars.  Use non-skid mats or decals in the tub or shower.  If you need to sit down in the shower, use a plastic, non-slip stool.  Keep the floor dry. Clean up any water that spills on the floor as soon as it happens.  Remove soap buildup in the tub or shower regularly.  Attach bath mats securely with double-sided non-slip rug tape.  Do not have throw rugs and other things on the floor that can make you trip. What can I do in the bedroom?  Use night lights.  Make sure that you have a light by your bed that is easy to reach.  Do not use any sheets or blankets that are too big for your bed. They should not hang down onto the floor.  Have a firm chair that has side arms. You can use this for support while you get dressed.  Do not have throw rugs and other things on the floor that can make you trip. What can I do in the kitchen?  Clean up any spills right away.  Avoid walking on wet floors.  Keep items that you use a lot in easy-to-reach places.  If you need to reach something above you, use a strong step stool that has a grab bar.  Keep electrical cords out of the way.  Do not use floor polish or wax that makes floors slippery. If you must use wax, use non-skid floor wax.  Do not have throw rugs and other things on the floor that can make you trip. What can I do with my stairs?  Do not leave any items on the stairs.  Make sure that there are handrails on both sides of the stairs and use them. Fix handrails that are broken or loose. Make sure that handrails are as long as the stairways.  Check any carpeting to make sure that it is firmly attached to the stairs. Fix any carpet that is loose or worn.  Avoid having throw rugs at the top or bottom of the stairs. If you do have throw rugs, attach them to the floor with carpet tape.  Make sure that you have a light switch at the  top of the stairs and the bottom of the stairs. If you do not have them, ask someone to add them for you. What else can I do to help prevent falls?  Wear shoes that:  Do not have high heels.  Have rubber bottoms.  Are comfortable and fit you well.  Are closed at the toe. Do not wear sandals.  If you use a stepladder:  Make sure that it is fully opened. Do not climb a closed stepladder.  Make sure that both sides of the stepladder are locked into place.  Ask someone to hold it for you, if possible.  Clearly mark and make sure that you can see:  Any grab bars or handrails.  First and last steps.  Where the edge of each step is.  Use tools that help you move around (mobility aids) if they are needed. These include:  Canes.  Walkers.  Scooters.  Crutches.  Turn on the lights when you go into a dark area. Replace any light bulbs as soon as they burn out.  Set up your furniture so you have a clear path. Avoid moving your furniture around.  If any of your floors are uneven, fix them.  If there are any pets around you, be aware of where they are.  Review your medicines with your doctor. Some medicines can make you feel dizzy. This can increase your chance of falling. Ask your doctor what other things that you can do to help prevent falls. This information is not intended to replace advice given to you by your health care provider. Make sure you discuss any questions you have with your health care provider. Document Released: 01/29/2009 Document Revised: 09/10/2015 Document Reviewed: 05/09/2014 Elsevier Interactive Patient Education  2017 [employer].

## 2017-03-27 ENCOUNTER — Ambulatory Visit: Payer: Self-pay | Admitting: Cardiovascular Disease

## 2017-04-06 ENCOUNTER — Ambulatory Visit: Payer: Medicare Other | Admitting: Family Medicine

## 2017-05-18 DIAGNOSIS — M25551 Pain in right hip: Secondary | ICD-10-CM | POA: Diagnosis not present

## 2017-05-18 DIAGNOSIS — M47816 Spondylosis without myelopathy or radiculopathy, lumbar region: Secondary | ICD-10-CM | POA: Diagnosis not present

## 2017-06-13 ENCOUNTER — Ambulatory Visit (INDEPENDENT_AMBULATORY_CARE_PROVIDER_SITE_OTHER): Payer: Medicare Other | Admitting: Family Medicine

## 2017-06-13 ENCOUNTER — Encounter: Payer: Self-pay | Admitting: Family Medicine

## 2017-06-13 VITALS — BP 130/72 | HR 88 | Temp 98.0°F | Resp 16 | Wt 162.7 lb

## 2017-06-13 DIAGNOSIS — M5136 Other intervertebral disc degeneration, lumbar region: Secondary | ICD-10-CM

## 2017-06-13 DIAGNOSIS — J309 Allergic rhinitis, unspecified: Secondary | ICD-10-CM

## 2017-06-13 DIAGNOSIS — E7800 Pure hypercholesterolemia, unspecified: Secondary | ICD-10-CM

## 2017-06-13 DIAGNOSIS — I10 Essential (primary) hypertension: Secondary | ICD-10-CM | POA: Diagnosis not present

## 2017-06-13 DIAGNOSIS — D649 Anemia, unspecified: Secondary | ICD-10-CM | POA: Diagnosis not present

## 2017-06-13 DIAGNOSIS — K219 Gastro-esophageal reflux disease without esophagitis: Secondary | ICD-10-CM

## 2017-06-13 DIAGNOSIS — H409 Unspecified glaucoma: Secondary | ICD-10-CM | POA: Diagnosis not present

## 2017-06-13 DIAGNOSIS — R739 Hyperglycemia, unspecified: Secondary | ICD-10-CM

## 2017-06-13 LAB — COMPLETE METABOLIC PANEL WITH GFR
AG Ratio: 1.9 (calc) (ref 1.0–2.5)
ALBUMIN MSPROF: 4.3 g/dL (ref 3.6–5.1)
ALT: 11 U/L (ref 9–46)
AST: 17 U/L (ref 10–35)
Alkaline phosphatase (APISO): 54 U/L (ref 40–115)
BILIRUBIN TOTAL: 0.5 mg/dL (ref 0.2–1.2)
BUN: 15 mg/dL (ref 7–25)
CHLORIDE: 102 mmol/L (ref 98–110)
CO2: 23 mmol/L (ref 20–32)
Calcium: 9.3 mg/dL (ref 8.6–10.3)
Creat: 0.99 mg/dL (ref 0.70–1.18)
GFR, EST AFRICAN AMERICAN: 85 mL/min/{1.73_m2} (ref >=60)
GFR, Est Non African American: 74 mL/min/{1.73_m2} (ref >=60)
GLUCOSE: 99 mg/dL (ref 65–139)
Globulin: 2.3 g/dL (calc) (ref 1.9–3.7)
Potassium: 4.4 mmol/L (ref 3.5–5.3)
Sodium: 135 mmol/L (ref 135–146)
TOTAL PROTEIN: 6.6 g/dL (ref 6.1–8.1)

## 2017-06-13 LAB — LIPID PANEL
CHOLESTEROL: 210 mg/dL — AB (ref <200)
HDL: 39 mg/dL — AB (ref >40)
LDL Cholesterol (Calc): 151 mg/dL (calc) — ABNORMAL HIGH
NON-HDL CHOLESTEROL (CALC): 171 mg/dL — AB (ref <130)
Total CHOL/HDL Ratio: 5.4 (calc) — ABNORMAL HIGH (ref <5.0)
Triglycerides: 95 mg/dL (ref <150)

## 2017-06-13 LAB — CBC WITH DIFFERENTIAL/PLATELET
BASOS ABS: 82 {cells}/uL (ref 0–200)
Basophils Relative: 0.9 %
EOS PCT: 4.8 %
Eosinophils Absolute: 437 cells/uL (ref 15–500)
HEMATOCRIT: 37.9 % — AB (ref 38.5–50.0)
Hemoglobin: 12.8 g/dL — ABNORMAL LOW (ref 13.2–17.1)
LYMPHS ABS: 2157 {cells}/uL (ref 850–3900)
MCH: 31.1 pg (ref 27.0–33.0)
MCHC: 33.8 g/dL (ref 32.0–36.0)
MCV: 92 fL (ref 80.0–100.0)
MPV: 10.3 fL (ref 7.5–12.5)
Monocytes Relative: 10.8 %
NEUTROS PCT: 59.8 %
Neutro Abs: 5442 cells/uL (ref 1500–7800)
Platelets: 195 10*3/uL (ref 140–400)
RBC: 4.12 10*6/uL — ABNORMAL LOW (ref 4.20–5.80)
RDW: 16.3 % — AB (ref 11.0–15.0)
Total Lymphocyte: 23.7 %
WBC: 9.1 10*3/uL (ref 3.8–10.8)
WBCMIX: 983 {cells}/uL — AB (ref 200–950)

## 2017-06-13 NOTE — Assessment & Plan Note (Signed)
Managed by specialist at [facility]

## 2017-06-13 NOTE — Assessment & Plan Note (Signed)
Under care of eye specialist

## 2017-06-13 NOTE — Assessment & Plan Note (Signed)
Well-controlled; no PPI

## 2017-06-13 NOTE — Assessment & Plan Note (Signed)
Avoid decongestants 

## 2017-06-13 NOTE — Patient Instructions (Addendum)
Try to follow the DASH guidelines (DASH stands for Dietary Approaches to Stop Hypertension). Try to limit the sodium in your diet to no more than 1,500mg  of sodium per day. Certainly try to not exceed 2,000 mg per day at the very most. Do not add salt when cooking or at the table.  Check the sodium amount on labels when shopping, and choose items lower in sodium when given a choice. Avoid or limit foods that already contain a lot of sodium. Eat a diet rich in fruits and vegetables and whole grains, and try to lose weight if overweight or obese  Try to limit saturated fats in your diet (bologna, hot dogs, barbeque, cheeseburgers, hamburgers, steak, bacon, sausage, cheese, etc.) and get more fresh fruits, vegetables, and whole grains  Try to use PLAIN allergy medicine without the decongestant  Avoid: phenylephrine, phenylpropanolamine, and pseudoephredine  Try 2% milk instead of whole milk  Let's get labs today If you have not heard anything from my staff in a week about any orders/referrals/studies from today, please contact us here to follow-up [phone]   DASH Eating Plan DASH stands for "Dietary Approaches to Stop Hypertension." The DASH eating plan is a healthy eating plan that has been shown to reduce high blood pressure (hypertension). It may also reduce your risk for type 2 diabetes, heart disease, and stroke. The DASH eating plan may also help with weight loss. What are tips for following this plan? General guidelines  Avoid eating more than 2,300 mg (milligrams) of salt (sodium) a day. If you have hypertension, you may need to reduce your sodium intake to 1,500 mg a day.  Limit alcohol intake to no more than 1 drink a day for nonpregnant women and 2 drinks a day for men. One drink equals 12 oz of beer, 5 oz of wine, or 1 oz of hard liquor.  Work with your health care provider to maintain a healthy body weight or to lose weight. Ask what an ideal weight is for you.  Get at  least 30 minutes of exercise that causes your heart to beat faster (aerobic exercise) most days of the week. Activities may include walking, swimming, or biking.  Work with your health care provider or diet and nutrition specialist (dietitian) to adjust your eating plan to your individual calorie needs. Reading food labels  Check food labels for the amount of sodium per serving. Choose foods with less than 5 percent of the Daily Value of sodium. Generally, foods with less than 300 mg of sodium per serving fit into this eating plan.  To find whole grains, look for the word "whole" as the first word in the ingredient list. Shopping  Buy products labeled as "low-sodium" or "no salt added."  Buy fresh foods. Avoid canned foods and premade or frozen meals. Cooking  Avoid adding salt when cooking. Use salt-free seasonings or herbs instead of table salt or sea salt. Check with your health care provider or pharmacist before using salt substitutes.  Do not fry foods. Cook foods using healthy methods such as baking, boiling, grilling, and broiling instead.  Cook with heart-healthy oils, such as olive, canola, soybean, or sunflower oil. Meal planning   Eat a balanced diet that includes: ? 5 or more servings of fruits and vegetables each day. At each meal, try to fill half of your plate with fruits and vegetables. ? Up to 6-8 servings of whole grains each day. ? Less than 6 oz of lean meat, poultry, or  fish each day. A 3-oz serving of meat is about the same size as a deck of cards. One egg equals 1 oz. ? 2 servings of low-fat dairy each day. ? A serving of nuts, seeds, or beans 5 times each week. ? Heart-healthy fats. Healthy fats called Omega-3 fatty acids are found in foods such as flaxseeds and coldwater fish, like sardines, salmon, and mackerel.  Limit how much you eat of the following: ? Canned or prepackaged foods. ? Food that is high in trans fat, such as fried foods. ? Food that is high  in saturated fat, such as fatty meat. ? Sweets, desserts, sugary drinks, and other foods with added sugar. ? Full-fat dairy products.  Do not salt foods before eating.  Try to eat at least 2 vegetarian meals each week.  Eat more home-cooked food and less restaurant, buffet, and fast food.  When eating at a restaurant, ask that your food be prepared with less salt or no salt, if possible. What foods are recommended? The items listed may not be a complete list. Talk with your dietitian about what dietary choices are best for you. Grains Whole-grain or whole-wheat bread. Whole-grain or whole-wheat pasta. Brown rice. [place]. Bulgur. Whole-grain and low-sodium cereals. Pita bread. Low-fat, low-sodium crackers. Whole-wheat flour tortillas. Vegetables Fresh or frozen vegetables (raw, steamed, roasted, or grilled). Low-sodium or reduced-sodium tomato and vegetable juice. Low-sodium or reduced-sodium tomato sauce and tomato paste. Low-sodium or reduced-sodium canned vegetables. Fruits All fresh, dried, or frozen fruit. Canned fruit in natural juice (without added sugar). Meat and other protein foods Skinless chicken or [place]. Ground chicken or [place]. Pork with fat trimmed off. Fish and seafood. Egg whites. Dried beans, peas, or lentils. Unsalted nuts, nut butters, and seeds. Unsalted canned beans. Lean cuts of beef with fat trimmed off. Low-sodium, lean deli meat. Dairy Low-fat (1%) or fat-free (skim) milk. Fat-free, low-fat, or reduced-fat cheeses. Nonfat, low-sodium ricotta or cottage cheese. Low-fat or nonfat yogurt. Low-fat, low-sodium cheese. Fats and oils Soft margarine without trans fats. Vegetable oil. Low-fat, reduced-fat, or light mayonnaise and salad dressings (reduced-sodium). Canola, safflower, olive, soybean, and sunflower oils. Avocado. Seasoning and other foods Herbs. Spices. Seasoning mixes without salt. Unsalted popcorn and pretzels. Fat-free sweets. What foods are not  recommended? The items listed may not be a complete list. Talk with your dietitian about what dietary choices are best for you. Grains Baked goods made with fat, such as croissants, muffins, or some breads. Dry pasta or rice meal packs. Vegetables Creamed or fried vegetables. Vegetables in a cheese sauce. Regular canned vegetables (not low-sodium or reduced-sodium). Regular canned tomato sauce and paste (not low-sodium or reduced-sodium). Regular tomato and vegetable juice (not low-sodium or reduced-sodium). Angie Fava. Olives. Fruits Canned fruit in a light or heavy syrup. Fried fruit. Fruit in cream or butter sauce. Meat and other protein foods Fatty cuts of meat. Ribs. Fried meat. Berniece Salines. Sausage. Bologna and other processed lunch meats. Salami. Fatback. Hotdogs. Bratwurst. Salted nuts and seeds. Canned beans with added salt. Canned or smoked fish. Whole eggs or egg yolks. Chicken or [place] with skin. Dairy Whole or 2% milk, cream, and half-and-half. Whole or full-fat cream cheese. Whole-fat or sweetened yogurt. Full-fat cheese. Nondairy creamers. Whipped toppings. Processed cheese and cheese spreads. Fats and oils Butter. Stick margarine. Lard. Shortening. Ghee. Bacon fat. Tropical oils, such as coconut, palm kernel, or palm oil. Seasoning and other foods Salted popcorn and pretzels. Onion salt, garlic salt, seasoned salt, table salt, and sea salt.  Worcestershire sauce. Tartar sauce. Barbecue sauce. Teriyaki sauce. Soy sauce, including reduced-sodium. Steak sauce. Canned and packaged gravies. Fish sauce. Oyster sauce. Cocktail sauce. Horseradish that you find on the shelf. Ketchup. Mustard. Meat flavorings and tenderizers. Bouillon cubes. Hot sauce and Tabasco sauce. Premade or packaged marinades. Premade or packaged taco seasonings. Relishes. Regular salad dressings. Where to find more information:  National Heart, Lung, and [place]: https://wilson-eaton.com/  American Heart Association:  www.heart.org Summary  The DASH eating plan is a healthy eating plan that has been shown to reduce high blood pressure (hypertension). It may also reduce your risk for type 2 diabetes, heart disease, and stroke.  With the DASH eating plan, you should limit salt (sodium) intake to 2,300 mg a day. If you have hypertension, you may need to reduce your sodium intake to 1,500 mg a day.  When on the DASH eating plan, aim to eat more fresh fruits and vegetables, whole grains, lean proteins, low-fat dairy, and heart-healthy fats.  Work with your health care provider or diet and nutrition specialist (dietitian) to adjust your eating plan to your individual calorie needs. This information is not intended to replace advice given to you by your health care provider. Make sure you discuss any questions you have with your health care provider. Document Released: 03/24/2011 Document Revised: 03/28/2016 Document Reviewed: 03/28/2016 Elsevier Interactive Patient Education  [employer].

## 2017-06-13 NOTE — Progress Notes (Signed)
BP 130/72   Pulse 88   Temp 98 F (36.7 C) (Oral)   Resp 16   Wt 162 lb 11.2 oz (73.8 kg)   SpO2 94%   BMI 27.07 kg/m    Subjective:    Patient ID: Michael Stevens, male    DOB: 02/28/1941, 77 y.o.   MRN: [id number]  HPI: Michael Stevens is a 77 y.o. male  Chief Complaint  Patient presents with  . Follow-up    pt states not taking choles. or BP meds  . Leg Pain    right leg , already sees ortho    HPI Patient is here as a new patient to me, previously seen by colleague here who left the practice He is having terrible knee pain, going to a chiropractor for this; that is his main problem; going on for 2-3 months; couldn't hardly walk one morning; maybe a nerve pinching on something; next day it might be okay; chiropractor has done xrays and has had MRI of the lumbar spine; has all kinds of bad back issues; operated on 6-7 years ago; no B/B dysfunction; taking aleve and once in a while he'll take a pain pill; gets unbearable at times; in the butt too; comes and goes; they mentioned sciatica; nothing flares it up; seeing NP at [facility]; going back to see her soon Reviewed the note from [facility]: ----------------------------------------------------------------- Allene Dillon, NP - 04/07/2017 11:30 AM EST Formatting of this note might be different from the original. HPI The patient is a pleasant 77 year old gentleman who presents today as a self-referral for acute on chronic low back pain without radiation to lower extremities. His pain is localized to his right lower lumbar spine and flank region. He states that his pain began about 6 years ago without any preceding injury. He states his pain is moderate to severe and constant. Increases with bending, lifting and squatting. Improves with sitting and rest. He is to be an avid gardener and enjoys mowing his yard; he is no longer able to do these activities. Medications include Aleve about every other day (moderate relief). He  reports he has not tried any other medications. His wife rubs horse ligament every night to his lower right back (mild to moderate relief). He is tried acupuncture (no relief), chiropractic manipulation (no relief), working with a physical trainer at the gym (mild relief), massage therapy (good relief during). That he is able to walk without any difficulty and he walks 30 minutes on a treadmill 2-3x/week.   He has received 4 injections by Dr. Pearline Cables last one noted to be bilateral L5-S1 transforaminal ESI in May 2018. He had a lumbar L2-3 decompression in 2012 x 5 physician in [place] unsure of name. ------------------------------------------------------------------ HTN; well-controlled; not on medicine any more; they tell him now that he has the BP of a teenager  High cholesterol; limited by his leg/back with exercise; not very much fatty meat, maybe bacon once a week or not even that; not many hot dogs; likes cheese; likes whole milk Lab Results  Component Value Date   CHOL 177 01/04/2017   HDL 46 01/04/2017   LDLCALC 133 (H) 06/21/2016   TRIG 102 01/04/2017   CHOLHDL 3.8 01/04/2017  last LDL was 111 on 01/04/17  Hx of anemia; last H/H 13.2/39.1; saw heme for low blood counts, but never found out why; back to normal  Hx of hyperglycemia on the chart; last two glucose readings normal; no fam hx  Allergies; not sure  what  GERD; used to take prilosec, but no problems recently; no problems in years  Depression screen [facility] 2/9 06/13/2017 03/24/2017 03/14/2017 01/04/2017 10/04/2016  Decreased Interest 0 0 0 0 0  Down, Depressed, Hopeless 0 0 0 0 0  PHQ - 2 Score 0 0 0 0 0    Relevant past medical, surgical, family and social history reviewed Past Medical History:  Diagnosis Date  . Allergic rhinitis   . Anemia   . Arthritis    Back  . Bladder neck obstruction   . GERD (gastroesophageal reflux disease)    ocassional  . Glaucoma   . Hypertension   . Impotence    Past Surgical History:   Procedure Laterality Date  . BACK SURGERY  April 2011   Family History  Problem Relation Age of Onset  . Healthy Mother   . Healthy Brother    Social History   Tobacco Use  . Smoking status: Former Smoker    Packs/day: 2.00    Years: 30.00    Pack years: 60.00    Types: Cigarettes    Last attempt to quit: 12/01/1982    Years since quitting: 34.5  . Smokeless tobacco: Never Used  . Tobacco comment: Quit 34 years ago  Substance Use Topics  . Alcohol use: Yes    Alcohol/week: 1.2 - 1.8 oz    Types: 2 - 3 Cans of beer per week    Comment: occasional  . Drug use: No    Interim medical history since last visit reviewed. Allergies and medications reviewed  Review of Systems  Constitutional: Negative for unexpected weight change (intentional weight loss over last 3 months, was working out).  HENT:       No bleeding from gums  Gastrointestinal: Negative for blood in stool.  Genitourinary: Negative for hematuria.   Per HPI unless specifically indicated above     Objective:    BP 130/72   Pulse 88   Temp 98 F (36.7 C) (Oral)   Resp 16   Wt 162 lb 11.2 oz (73.8 kg)   SpO2 94%   BMI 27.07 kg/m   Wt Readings from Last 3 Encounters:  06/13/17 162 lb 11.2 oz (73.8 kg)  03/24/17 170 lb 6.4 oz (77.3 kg)  03/14/17 169 lb 3.2 oz (76.7 kg)  MD note: 165 pounds stable range for weight over 10 years  Physical Exam  Constitutional: He appears well-developed and well-nourished. No distress.  Eyes: No scleral icterus.  Cardiovascular: Normal rate and regular rhythm.  Pulmonary/Chest: Effort normal and breath sounds normal.  Abdominal: Bowel sounds are normal. He exhibits no distension.  Neurological: He is alert.  Skin: No pallor.  Nailbeds were not pale  Psychiatric: He has a normal mood and affect.    Results for orders placed or performed in visit on 01/31/17  Lipase  Result Value Ref Range   Lipase 38 7 - 60 U/L      Assessment & Plan:   Problem List Items  Addressed This Visit      Cardiovascular and Mediastinum   Essential hypertension    Well-controlled off of medicine; try the DASH guidelines; avoid decongestantas and salt      Relevant Medications   aspirin EC 81 MG tablet     Respiratory   Allergic rhinitis    Avoid decongestants        Digestive   GERD (gastroesophageal reflux disease)    Well-controlled; no PPI  Musculoskeletal and Integument   DDD (degenerative disc disease), lumbar    Managed by specialist at [facility]      Relevant Medications   aspirin EC 81 MG tablet     Other   Anemia - Primary   Hyperglycemia   Relevant Orders   Basic Metabolic Panel (BMET)   Hypercholesteremia    Patient does not want to take statin; wants to try healthier diet, limit fat in milk, etc.; check lipids today      Relevant Medications   aspirin EC 81 MG tablet   Other Relevant Orders   Lipid panel   Glaucoma    Under care of eye specialist      Relevant Medications   latanoprost (XALATAN) 0.005 % ophthalmic solution       Follow up plan: Return in about 6 months (around 12/11/2017) for follow-up visit with Dr. Sanda Klein; Medicare wellness visit when due.  An after-visit summary was printed and given to the patient at [place].  Please see the patient instructions which may contain other information and recommendations beyond what is mentioned above in the assessment and plan.  No orders of the defined types were placed in this encounter.   Orders Placed This Encounter  Procedures  . Lipid panel  . Basic Metabolic Panel (BMET)

## 2017-06-13 NOTE — Assessment & Plan Note (Signed)
Well-controlled off of medicine; try the DASH guidelines; avoid decongestantas and salt

## 2017-06-13 NOTE — Assessment & Plan Note (Signed)
Patient does not want to take statin; wants to try healthier diet, limit fat in milk, etc.; check lipids today

## 2017-06-14 ENCOUNTER — Other Ambulatory Visit: Payer: Self-pay | Admitting: Family Medicine

## 2017-06-14 ENCOUNTER — Other Ambulatory Visit: Payer: Self-pay

## 2017-06-14 DIAGNOSIS — E7800 Pure hypercholesterolemia, unspecified: Secondary | ICD-10-CM

## 2017-06-14 DIAGNOSIS — D649 Anemia, unspecified: Secondary | ICD-10-CM

## 2017-06-14 MED ORDER — ATORVASTATIN CALCIUM 20 MG PO TABS: 20.0000 mg | ORAL_TABLET | Freq: Every day | ORAL | 0 refills | Status: DC

## 2017-06-14 NOTE — Progress Notes (Signed)
Start back on statin 

## 2017-06-21 ENCOUNTER — Inpatient Hospital Stay
Admission: EM | Admit: 2017-06-21 | Discharge: 2017-06-25 | DRG: 378 | Disposition: A | Discharge status: Home / Self Care | Payer: Medicare Other | Attending: Internal Medicine | Admitting: Internal Medicine

## 2017-06-21 ENCOUNTER — Encounter: Payer: Self-pay | Admitting: Emergency Medicine

## 2017-06-21 ENCOUNTER — Emergency Department: Payer: Medicare Other

## 2017-06-21 ENCOUNTER — Other Ambulatory Visit: Payer: Self-pay

## 2017-06-21 DIAGNOSIS — E669 Obesity, unspecified: Secondary | ICD-10-CM | POA: Diagnosis present

## 2017-06-21 DIAGNOSIS — Z791 Long term (current) use of non-steroidal anti-inflammatories (NSAID): Secondary | ICD-10-CM | POA: Diagnosis not present

## 2017-06-21 DIAGNOSIS — M545 Low back pain: Secondary | ICD-10-CM | POA: Diagnosis present

## 2017-06-21 DIAGNOSIS — Z87891 Personal history of nicotine dependence: Secondary | ICD-10-CM

## 2017-06-21 DIAGNOSIS — D649 Anemia, unspecified: Secondary | ICD-10-CM

## 2017-06-21 DIAGNOSIS — J309 Allergic rhinitis, unspecified: Secondary | ICD-10-CM | POA: Diagnosis present

## 2017-06-21 DIAGNOSIS — E785 Hyperlipidemia, unspecified: Secondary | ICD-10-CM | POA: Diagnosis present

## 2017-06-21 DIAGNOSIS — H409 Unspecified glaucoma: Secondary | ICD-10-CM | POA: Diagnosis present

## 2017-06-21 DIAGNOSIS — Z7982 Long term (current) use of aspirin: Secondary | ICD-10-CM

## 2017-06-21 DIAGNOSIS — K922 Gastrointestinal hemorrhage, unspecified: Secondary | ICD-10-CM

## 2017-06-21 DIAGNOSIS — K219 Gastro-esophageal reflux disease without esophagitis: Secondary | ICD-10-CM | POA: Diagnosis present

## 2017-06-21 DIAGNOSIS — K264 Chronic or unspecified duodenal ulcer with hemorrhage: Principal | ICD-10-CM | POA: Diagnosis present

## 2017-06-21 DIAGNOSIS — M479 Spondylosis, unspecified: Secondary | ICD-10-CM | POA: Diagnosis present

## 2017-06-21 DIAGNOSIS — G8929 Other chronic pain: Secondary | ICD-10-CM | POA: Diagnosis present

## 2017-06-21 DIAGNOSIS — I10 Essential (primary) hypertension: Secondary | ICD-10-CM | POA: Diagnosis present

## 2017-06-21 DIAGNOSIS — E7800 Pure hypercholesterolemia, unspecified: Secondary | ICD-10-CM | POA: Diagnosis present

## 2017-06-21 DIAGNOSIS — Z6826 Body mass index (BMI) 26.0-26.9, adult: Secondary | ICD-10-CM

## 2017-06-21 DIAGNOSIS — K222 Esophageal obstruction: Secondary | ICD-10-CM | POA: Diagnosis present

## 2017-06-21 DIAGNOSIS — D62 Acute posthemorrhagic anemia: Secondary | ICD-10-CM | POA: Diagnosis present

## 2017-06-21 DIAGNOSIS — R42 Dizziness and giddiness: Secondary | ICD-10-CM | POA: Diagnosis present

## 2017-06-21 LAB — ABO/RH: ABO/RH(D): O NEG

## 2017-06-21 LAB — CBC WITH DIFFERENTIAL/PLATELET
BASOS ABS: 0 10*3/uL (ref 0–0.1)
BASOS PCT: 0 %
Band Neutrophils: 3 %
Blasts: 0 %
EOS ABS: 0.2 10*3/uL (ref 0–0.7)
Eosinophils Relative: 1 %
HCT: 15.9 % — ABNORMAL LOW (ref 40.0–52.0)
Hemoglobin: 5.2 g/dL — ABNORMAL LOW (ref 13.0–18.0)
LYMPHS ABS: 2.1 10*3/uL (ref 1.0–3.6)
Lymphocytes Relative: 10 %
MCH: 31.2 pg (ref 26.0–34.0)
MCHC: 33 g/dL (ref 32.0–36.0)
MCV: 94.6 fL (ref 80.0–100.0)
METAMYELOCYTES PCT: 5 %
MYELOCYTES: 1 %
Monocytes Absolute: 0.2 10*3/uL (ref 0.2–1.0)
Monocytes Relative: 1 %
NRBC: 0 /100{WBCs}
Neutro Abs: 18.3 10*3/uL — ABNORMAL HIGH (ref 1.4–6.5)
Neutrophils Relative %: 79 %
Other: 0 %
PLATELETS: 209 10*3/uL (ref 150–440)
PROMYELOCYTES ABS: 0 %
RBC: 1.68 MIL/uL — ABNORMAL LOW (ref 4.40–5.90)
RDW: 18.2 % — ABNORMAL HIGH (ref 11.5–14.5)
WBC: 20.8 10*3/uL — ABNORMAL HIGH (ref 3.8–10.6)

## 2017-06-21 LAB — URINALYSIS, COMPLETE (UACMP) WITH MICROSCOPIC
Bacteria, UA: NONE SEEN
Bilirubin Urine: NEGATIVE
Glucose, UA: NEGATIVE mg/dL
Hgb urine dipstick: NEGATIVE
KETONES UR: NEGATIVE mg/dL
Leukocytes, UA: NEGATIVE
Nitrite: NEGATIVE
PH: 5 (ref 5.0–8.0)
PROTEIN: NEGATIVE mg/dL
SQUAMOUS EPITHELIAL / LPF: NONE SEEN
Specific Gravity, Urine: 1.013 (ref 1.005–1.030)

## 2017-06-21 LAB — COMPREHENSIVE METABOLIC PANEL
ALBUMIN: 3.5 g/dL (ref 3.5–5.0)
ALK PHOS: 32 U/L — AB (ref 38–126)
ALT: 19 U/L (ref 17–63)
ANION GAP: 12 (ref 5–15)
AST: 31 U/L (ref 15–41)
BILIRUBIN TOTAL: 0.7 mg/dL (ref 0.3–1.2)
BUN: 34 mg/dL — ABNORMAL HIGH (ref 6–20)
CALCIUM: 8.7 mg/dL — AB (ref 8.9–10.3)
CO2: 19 mmol/L — ABNORMAL LOW (ref 22–32)
CREATININE: 1.16 mg/dL (ref 0.61–1.24)
Chloride: 104 mmol/L (ref 101–111)
GFR calc non Af Amer: 59 mL/min — ABNORMAL LOW (ref >60)
GLUCOSE: 175 mg/dL — AB (ref 65–99)
Potassium: 4 mmol/L (ref 3.5–5.1)
Sodium: 135 mmol/L (ref 135–145)
TOTAL PROTEIN: 6 g/dL — AB (ref 6.5–8.1)

## 2017-06-21 LAB — TROPONIN I: Troponin I: <0.03 ng/mL (ref <0.03)

## 2017-06-21 LAB — PREPARE RBC (CROSSMATCH)

## 2017-06-21 LAB — GLUCOSE, CAPILLARY: GLUCOSE-CAPILLARY: 133 mg/dL — AB (ref 65–99)

## 2017-06-21 MED ORDER — PANTOPRAZOLE SODIUM 40 MG IV SOLR
40.0000 mg | Freq: Two times a day (BID) | INTRAVENOUS | Status: DC
  Administered 2017-06-25 (×2): 40 mg via INTRAVENOUS
  Filled 2017-06-21 (×3): qty 40

## 2017-06-21 MED ORDER — ONDANSETRON HCL 4 MG/2ML IJ SOLN: 4.0000 mg | Freq: Four times a day (QID) | INTRAMUSCULAR | Status: DC | PRN

## 2017-06-21 MED ORDER — ACETAMINOPHEN 650 MG RE SUPP: 650.0000 mg | Freq: Four times a day (QID) | RECTAL | Status: DC | PRN

## 2017-06-21 MED ORDER — ACETAMINOPHEN 325 MG PO TABS: 650.0000 mg | ORAL_TABLET | Freq: Four times a day (QID) | ORAL | Status: DC | PRN

## 2017-06-21 MED ORDER — PANTOPRAZOLE SODIUM 40 MG IV SOLR: 40.0000 mg | Freq: Once | INTRAVENOUS | Status: DC

## 2017-06-21 MED ORDER — SODIUM CHLORIDE 0.9 % IV SOLN
8.0000 mg/h | INTRAVENOUS | Status: AC
  Administered 2017-06-21 – 2017-06-24 (×4): 8 mg/h via INTRAVENOUS
  Filled 2017-06-21 (×8): qty 80

## 2017-06-21 MED ORDER — ONDANSETRON HCL 4 MG PO TABS: 4.0000 mg | ORAL_TABLET | Freq: Four times a day (QID) | ORAL | Status: DC | PRN

## 2017-06-21 MED ORDER — LATANOPROST 0.005 % OP SOLN
1.0000 [drp] | Freq: Every day | OPHTHALMIC | Status: DC
Start: 2017-06-21 — End: 2017-06-25
  Administered 2017-06-21 – 2017-06-24 (×4): 1 [drp] via OPHTHALMIC
  Filled 2017-06-21: qty 2.5

## 2017-06-21 MED ORDER — INSULIN ASPART 100 UNIT/ML ~~LOC~~ SOLN: 0.0000 [IU] | Freq: Three times a day (TID) | SUBCUTANEOUS | Status: DC

## 2017-06-21 MED ORDER — SODIUM CHLORIDE 0.9 % IV BOLUS (SEPSIS)
1000.0000 mL | Freq: Once | INTRAVENOUS | Status: AC
  Administered 2017-06-21: 1000 mL via INTRAVENOUS

## 2017-06-21 MED ORDER — SODIUM CHLORIDE 0.9 % IV SOLN: 10.0000 mL/h | Freq: Once | INTRAVENOUS | Status: DC

## 2017-06-21 MED ORDER — TIZANIDINE HCL 2 MG PO TABS
2.0000 mg | ORAL_TABLET | Freq: Three times a day (TID) | ORAL | Status: DC
  Administered 2017-06-21: 21:00:00 2 mg via ORAL
  Filled 2017-06-21 (×2): qty 1

## 2017-06-21 MED ORDER — ATORVASTATIN CALCIUM 20 MG PO TABS
20.0000 mg | ORAL_TABLET | Freq: Every day | ORAL | Status: DC
  Administered 2017-06-21 – 2017-06-24 (×4): 20 mg via ORAL
  Filled 2017-06-21 (×4): qty 1

## 2017-06-21 NOTE — ED Notes (Signed)
Per pt call Darius Bump his brother-in-law and inform him of what is going on. Ray Laurance Flatten informed and will immediately contact wife Pam and someone will be on there way. Pt notified

## 2017-06-21 NOTE — ED Notes (Signed)
Pt eating at this time.

## 2017-06-21 NOTE — ED Notes (Signed)
Upon further questioning, pt does report black, tarry stools x approximately one week.

## 2017-06-21 NOTE — ED Triage Notes (Signed)
Pt in via POV from chiropractors office, pt with complaints of dizziness over last three days, pt pale upon arrival, denies any blood thinners.  Pt hypotensive, tachycardic, other vitals WDL.

## 2017-06-21 NOTE — Consult Note (Signed)
[facility] GI Inpatient Consult Note   Kathline Magic, M.D.  Reason for Consult: GI bleed, anemia   Attending Requesting Consult: Abel Presto, M.D.  History of Present Illness: Michael Stevens is a 77 y.o. male with a history of chronic back pain presented to the emergency room with symptoms of fatigue and presyncope.  Patient takes Aleve twice daily for chronic lower back pain recommended by his orthopedic surgeon.  Patient was at his chiropractor's office earlier today when he started feeling very dizzy and feeling like he was going to pass out.  His chiropractors office assistant drove the patient to the hospital.  The patient complains of melena 1-2 bouts per day for the last 2 weeks.  When he last saw his physician, Dr. Enid Derry, on 06/13/2017 hemoglobin was stable at 12.8.  No significant blood loss was noted at that time.  The patient does complain that he does notice persistent lower back pain but denies any abdominal pain, nausea or vomiting. The patient is a followed by Dr. Vira Agar of our group and underwent a colonoscopy several years ago without any  findings of colon polyps or colorectal cancer.  Past Medical History:  Past Medical History:  Diagnosis Date  . Allergic rhinitis   . Anemia   . Arthritis    Back  . Bladder neck obstruction   . GERD (gastroesophageal reflux disease)    ocassional  . Glaucoma   . Hypertension   . Impotence     Problem List: Patient Active Problem List   Diagnosis Date Noted  . GI bleed 06/21/2017  . Abnormal EKG 01/31/2017  . Right bundle branch block 01/31/2017  . 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 Hyperglycemia 12/22/2015  . DDD (degenerative disc disease), lumbar 07/14/2015  . Anemia 01/14/2015  . Allergic rhinitis 10/13/2014  . 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 DD (diverticular disease) 10/13/2014  . Gastric catarrh 10/13/2014  . GERD (gastroesophageal reflux  disease) 10/13/2014  . Glaucoma 10/13/2014  . Hypercholesteremia 10/13/2014  . Essential hypertension 10/13/2014    Past Surgical History: Past Surgical History:  Procedure Laterality Date  . BACK SURGERY  April 2011    Allergies: No Known Allergies  Home Medications:  (Not in a hospital admission) Home medication reconciliation was completed with the patient.   Scheduled Inpatient Medications:   . [START ON 06/25/2017] pantoprazole  40 mg Intravenous Q12H    Continuous Inpatient Infusions:   . sodium chloride    . pantoprozole (PROTONIX) infusion 8 mg/hr (06/21/17 1532)    PRN Inpatient Medications:    Family History: family history includes Healthy in his brother and mother.   GI Family History: Negative  Social History:   reports that he quit smoking about 34 years ago. His smoking use included cigarettes. He has a 60.00 pack-year smoking history. he has never used smokeless tobacco. He reports that he drinks about 1.2 - 1.8 oz of alcohol per week. He reports that he does not use drugs. The patient denies ETOH, tobacco, or drug use.    Review of Systems: Review of Systems - General ROS: positive for  - fatigue Psychological ROS: negative Allergy and Immunology ROS: negative for - hives, latex or nasal congestion Hematological and Lymphatic ROS: positive for - bleeding problems Endocrine ROS: negative Respiratory ROS: no cough, shortness of breath, or wheezing Cardiovascular ROS: no chest pain or dyspnea on exertion Genito-Urinary ROS: no dysuria, trouble voiding, or hematuria  Musculoskeletal ROS: negative Neurological ROS: no TIA or stroke symptoms Dermatological ROS: negative GI review of systems are as in the history of present illness above.  Physical Examination: BP (!) 134/43   Pulse (!) 117   Temp 97.8 F (36.6 C) (Oral)   Resp 14   Ht 5\' 5"  (1.651 m)   Wt 72.6 kg (160 lb)   SpO2 100%   BMI 26.63 kg/m  Physical Exam  Data: Lab Results   Component Value Date   WBC 20.8 (H) 06/21/2017   HGB 5.2 (L) 06/21/2017   HCT 15.9 (L) 06/21/2017   MCV 94.6 06/21/2017   PLT 209 06/21/2017   Recent Labs  Lab 06/21/17 1326  HGB 5.2*   Lab Results  Component Value Date   NA 135 06/21/2017   K 4.0 06/21/2017   CL 104 06/21/2017   CO2 19 (L) 06/21/2017   BUN 34 (H) 06/21/2017   CREATININE 1.16 06/21/2017   GLU 109 06/17/2014   Lab Results  Component Value Date   ALT 19 06/21/2017   AST 31 06/21/2017   ALKPHOS 32 (L) 06/21/2017   BILITOT 0.7 06/21/2017   No results for input(s): APTT, INR, PTT in the last 168 hours. CBC Latest Ref Rng & Units 06/21/2017 06/13/2017 01/20/2017  WBC 3.8 - 10.6 K/uL 20.8(H) 9.1 7.9  Hemoglobin 13.0 - 18.0 g/dL 5.2(L) 12.8(L) 13.2  Hematocrit 40.0 - 52.0 % 15.9(L) 37.9(L) 39.1  Platelets 150 - 440 K/uL 209 195 158    STUDIES: Dg Chest Portable 1 View  Result Date: 06/21/2017 CLINICAL DATA:  Dizziness and weakness. EXAM: PORTABLE CHEST 1 VIEW COMPARISON:  Chest x-ray dated June 18, 2014. FINDINGS: The heart size and mediastinal contours are within normal limits. Both lungs are clear. The visualized skeletal structures are unremarkable. IMPRESSION: No active disease. Electronically Signed   By: Titus Dubin M.D.   On: 06/21/2017 14:08   @IMAGES @  Assessment: 1.  Symptomatic anemia-likely secondary to gastrointestinal blood loss.  2.  Melena-likely related to NSAID enteropathy versus peptic ulcer disease.  3.  Chronic NSAID use.  4.  Chronic back pain  Recommendations:  1.  Agree with blood transfusion given low hemoglobin of 5.2.  Would consider transfusing to hemoglobin of 8 or above.  2.  IV acid suppression with Protonix drip.  3.  Proceed with a EGD and when clinically feasible.  This has been planned tentatively for tomorrow.The patient understands the nature of the planned procedure, indications, risks, alternatives and potential complications including but not limited to  bleeding, infection, perforation, damage to internal organs and possible oversedation/side effects from anesthesia. The patient agrees and gives consent to proceed.  Please refer to procedure notes for findings, recommendations and patient disposition/instructions.  Thank you for the consult. Please call with questions or concerns.  Olean Ree, "Lanny Hurst MD [facility] [place], [place] [address] [phone]  06/21/2017 4:33 PM

## 2017-06-21 NOTE — H&P (Signed)
[facility] NAME: Michael Stevens    MR#:  [id number]  DATE OF BIRTH:  07-01-1940  DATE OF ADMISSION:  06/21/2017  PRIMARY CARE PHYSICIAN: Arnetha Courser, MD   REQUESTING/REFERRING PHYSICIAN: Dr. Brenton Grills  CHIEF COMPLAINT:   Chief Complaint  Patient presents with  . Dizziness    HISTORY OF PRESENT ILLNESS:  Michael Stevens  is a 77 y.o. male with a known history of essential hypertension, osteoarthritis, GERD, allergic rhinitis, history of chronic back pain who presents to the hospital due to generalized weakness, dizziness and exertional shortness of breath. Patient was sent over from his chiropractor's office as he was having significant pain in his lower back and also sciatica type symptoms and was having difficulty ambulating. He presented to the ER complaining of melanotic stools for about a week to 10 days and also dizziness upon standing and shortness of breath on exertion. Patient was noted to have a hemoglobin of 5.2 with a baseline hemoglobin of 12 about a week to 10 days ago. Patient denies any chest pains, abdominal pains, fever chills cough or any other associated symptoms. Hospitalist services were contacted further treatment and evaluation.  PAST MEDICAL HISTORY:   Past Medical History:  Diagnosis Date  . Allergic rhinitis   . Anemia   . Arthritis    Back  . Bladder neck obstruction   . GERD (gastroesophageal reflux disease)    ocassional  . Glaucoma   . Hypertension   . Impotence     PAST SURGICAL HISTORY:   Past Surgical History:  Procedure Laterality Date  . BACK SURGERY  April 2011    SOCIAL HISTORY:   Social History   Tobacco Use  . Smoking status: Former Smoker    Packs/day: 2.00    Years: 30.00    Pack years: 60.00    Types: Cigarettes    Last attempt to quit: 12/01/1982    Years since quitting: 34.5  . Smokeless tobacco: Never Used  . Tobacco comment: Quit 34 years ago  Substance Use  Topics  . Alcohol use: Yes    Alcohol/week: 1.2 - 1.8 oz    Types: 2 - 3 Cans of beer per week    Comment: occasional    FAMILY HISTORY:   Family History  Problem Relation Age of Onset  . Healthy Mother   . Healthy Brother     DRUG ALLERGIES:  No Known Allergies  REVIEW OF SYSTEMS:   Review of Systems  Constitutional: Negative for fever and weight loss.  HENT: Negative for congestion, nosebleeds and tinnitus.   Eyes: Negative for blurred vision, double vision and redness.  Respiratory: Negative for cough, hemoptysis and shortness of breath.   Cardiovascular: Negative for chest pain, orthopnea, leg swelling and PND.  Gastrointestinal: Positive for melena. Negative for abdominal pain, diarrhea, nausea and vomiting.  Genitourinary: Negative for dysuria, hematuria and urgency.  Musculoskeletal: Negative for falls and joint pain.  Neurological: Positive for dizziness and weakness. Negative for tingling, sensory change, focal weakness, seizures and headaches.  Endo/Heme/Allergies: Negative for polydipsia. Does not bruise/bleed easily.  Psychiatric/Behavioral: Negative for depression and memory loss. The patient is not nervous/anxious.     MEDICATIONS AT HOME:   Prior to Admission medications   Medication Sig Start Date End Date Taking? Authorizing Provider  aspirin EC 81 MG tablet Take 1 tablet (81 mg total) by mouth daily. 06/13/17  Yes Lada, Satira Anis, MD  atorvastatin (LIPITOR) 20  MG tablet Take 1 tablet (20 mg total) by mouth at bedtime. 06/14/17  Yes Lada, Satira Anis, MD  latanoprost (XALATAN) 0.005 % ophthalmic solution Place 1 drop into both eyes at bedtime. 06/13/17  Yes Lada, Satira Anis, MD  Camphor-Eucalyptus-Menthol (VICKS VAPORUB EX) Apply 1 application topically at bedtime. To clear nasal congestion    [provider]  tiZANidine (ZANAFLEX) 2 MG tablet Take 1 tablet by mouth 3 (three) times daily. 04/07/17   [provider]      VITAL SIGNS:  Blood  pressure 128/61, pulse (!) 56, temperature 97.8 F (36.6 C), temperature source Oral, resp. rate 16, height 5\' 5"  (1.651 m), weight 72.6 kg (160 lb), SpO2 100 %.  PHYSICAL EXAMINATION:  Physical Exam  GENERAL:  77 y.o.-year-old pale appearing patient lying in the bed in no acute distress.  EYES: Pupils equal, round, reactive to light and accommodation. No scleral icterus. Extraocular muscles intact. Pale conjunctiva HEENT: Head atraumatic, normocephalic. Oropharynx and nasopharynx clear. No oropharyngeal erythema, moist oral mucosa  NECK:  Supple, no jugular venous distention. No thyroid enlargement, no tenderness.  LUNGS: Normal breath sounds bilaterally, no wheezing, rales, rhonchi. No use of accessory muscles of respiration.  CARDIOVASCULAR: S1, S2 RRR, Tachycardic. No murmurs, rubs, gallops, clicks.  ABDOMEN: Soft, nontender, nondistended. Bowel sounds present. No organomegaly or mass.  EXTREMITIES: No pedal edema, cyanosis, or clubbing. + 2 pedal & radial pulses b/l.   NEUROLOGIC: Cranial nerves II through XII are intact. No focal Motor or sensory deficits appreciated b/l PSYCHIATRIC: The patient is alert and oriented x 3.  SKIN: No obvious rash, lesion, or ulcer.   LABORATORY PANEL:   CBC Recent Labs  Lab 06/21/17 1326  WBC 20.8*  HGB 5.2*  HCT 15.9*  PLT 209   ------------------------------------------------------------------------------------------------------------------  Chemistries  Recent Labs  Lab 06/21/17 1326  NA 135  K 4.0  CL 104  CO2 19*  GLUCOSE 175*  BUN 34*  CREATININE 1.16  CALCIUM 8.7*  AST 31  ALT 19  ALKPHOS 32*  BILITOT 0.7   ------------------------------------------------------------------------------------------------------------------  Cardiac Enzymes Recent Labs  Lab 06/21/17 1326  TROPONINI <0.03   ------------------------------------------------------------------------------------------------------------------  RADIOLOGY:  Dg  Chest Portable 1 View  Result Date: 06/21/2017 CLINICAL DATA:  Dizziness and weakness. EXAM: PORTABLE CHEST 1 VIEW COMPARISON:  Chest x-ray dated June 18, 2014. FINDINGS: The heart size and mediastinal contours are within normal limits. Both lungs are clear. The visualized skeletal structures are unremarkable. IMPRESSION: No active disease. Electronically Signed   By: Titus Dubin M.D.   On: 06/21/2017 14:08     IMPRESSION AND PLAN:   77 year old male with past medical history of hypertension, GERD, osteoarthritis, chronic back pain, glaucoma who presents to the hospital due to weakness, dizziness and exertional shortness of breath and noted to be anemic with melanotic stools.  1. Upper GI bleed-this causes patient's melanotic stools and acute blood loss anemia. -Patient does take increasing amount of NSAIDs and Aleve for his chronic back pain. He is also on baby aspirin. -We'll transfuse him 2 units of packed red blood cells, follow serial hemoglobin. Place him on a Protonix drip, clear liquid diet. Get a gastroenterology consult and patient will likely need an upper GI endoscopy. Hold aspirin for now.  2. Acute blood loss anemia-secondary to GI bleed. -We will transfuse patient 2 units of packed red blood cells and follow serial hemoglobin. Hold aspirin for now.  3. Hyperlipidemia-continue atorvastatin.  4. Glaucoma-continue latanoprost eyedrops.  5. Chronic back  pain-continue Zanaflex.    All the records are reviewed and case discussed with ED provider. Management plans discussed with the patient, family and they are in agreement.  CODE STATUS: Full code  TOTAL TIME TAKING CARE OF THIS PATIENT: 40 minutes.    Henreitta Leber M.D on 06/21/2017 at 3:06 PM  Between 7am to 6pm - Pager - [phone]  After 6pm go to [email or web address] - password [email or web address] [facility]  Office  [phone]  CC: Primary care physician; Arnetha Courser, MD

## 2017-06-21 NOTE — H&P (View-Only) (Signed)
[facility] GI Inpatient Consult Note   Kathline Magic, M.D.  Reason for Consult: GI bleed, anemia   Attending Requesting Consult: Abel Presto, M.D.  History of Present Illness: Michael Stevens is a 77 y.o. male with a history of chronic back pain presented to the emergency room with symptoms of fatigue and presyncope.  Patient takes Aleve twice daily for chronic lower back pain recommended by his orthopedic surgeon.  Patient was at his chiropractor's office earlier today when he started feeling very dizzy and feeling like he was going to pass out.  His chiropractors office assistant drove the patient to the hospital.  The patient complains of melena 1-2 bouts per day for the last 2 weeks.  When he last saw his physician, Dr. Enid Derry, on 06/13/2017 hemoglobin was stable at 12.8.  No significant blood loss was noted at that time.  The patient does complain that he does notice persistent lower back pain but denies any abdominal pain, nausea or vomiting. The patient is a followed by Dr. Vira Agar of our group and underwent a colonoscopy several years ago without any  findings of colon polyps or colorectal cancer.  Past Medical History:  Past Medical History:  Diagnosis Date  . Allergic rhinitis   . Anemia   . Arthritis    Back  . Bladder neck obstruction   . GERD (gastroesophageal reflux disease)    ocassional  . Glaucoma   . Hypertension   . Impotence     Problem List: Patient Active Problem List   Diagnosis Date Noted  . GI bleed 06/21/2017  . Abnormal EKG 01/31/2017  . Right bundle branch block 01/31/2017  . 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 Hyperglycemia 12/22/2015  . DDD (degenerative disc disease), lumbar 07/14/2015  . Anemia 01/14/2015  . Allergic rhinitis 10/13/2014  . 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 DD (diverticular disease) 10/13/2014  . Gastric catarrh 10/13/2014  . GERD (gastroesophageal reflux  disease) 10/13/2014  . Glaucoma 10/13/2014  . Hypercholesteremia 10/13/2014  . Essential hypertension 10/13/2014    Past Surgical History: Past Surgical History:  Procedure Laterality Date  . BACK SURGERY  April 2011    Allergies: No Known Allergies  Home Medications:  (Not in a hospital admission) Home medication reconciliation was completed with the patient.   Scheduled Inpatient Medications:   . [START ON 06/25/2017] pantoprazole  40 mg Intravenous Q12H    Continuous Inpatient Infusions:   . sodium chloride    . pantoprozole (PROTONIX) infusion 8 mg/hr (06/21/17 1532)    PRN Inpatient Medications:    Family History: family history includes Healthy in his brother and mother.   GI Family History: Negative  Social History:   reports that he quit smoking about 34 years ago. His smoking use included cigarettes. He has a 60.00 pack-year smoking history. he has never used smokeless tobacco. He reports that he drinks about 1.2 - 1.8 oz of alcohol per week. He reports that he does not use drugs. The patient denies ETOH, tobacco, or drug use.    Review of Systems: Review of Systems - General ROS: positive for  - fatigue Psychological ROS: negative Allergy and Immunology ROS: negative for - hives, latex or nasal congestion Hematological and Lymphatic ROS: positive for - bleeding problems Endocrine ROS: negative Respiratory ROS: no cough, shortness of breath, or wheezing Cardiovascular ROS: no chest pain or dyspnea on exertion Genito-Urinary ROS: no dysuria, trouble voiding, or hematuria  Musculoskeletal ROS: negative Neurological ROS: no TIA or stroke symptoms Dermatological ROS: negative GI review of systems are as in the history of present illness above.  Physical Examination: BP (!) 134/43   Pulse (!) 117   Temp 97.8 F (36.6 C) (Oral)   Resp 14   Ht 5\' 5"  (1.651 m)   Wt 72.6 kg (160 lb)   SpO2 100%   BMI 26.63 kg/m  Physical Exam  Data: Lab Results   Component Value Date   WBC 20.8 (H) 06/21/2017   HGB 5.2 (L) 06/21/2017   HCT 15.9 (L) 06/21/2017   MCV 94.6 06/21/2017   PLT 209 06/21/2017   Recent Labs  Lab 06/21/17 1326  HGB 5.2*   Lab Results  Component Value Date   NA 135 06/21/2017   K 4.0 06/21/2017   CL 104 06/21/2017   CO2 19 (L) 06/21/2017   BUN 34 (H) 06/21/2017   CREATININE 1.16 06/21/2017   GLU 109 06/17/2014   Lab Results  Component Value Date   ALT 19 06/21/2017   AST 31 06/21/2017   ALKPHOS 32 (L) 06/21/2017   BILITOT 0.7 06/21/2017   No results for input(s): APTT, INR, PTT in the last 168 hours. CBC Latest Ref Rng & Units 06/21/2017 06/13/2017 01/20/2017  WBC 3.8 - 10.6 K/uL 20.8(H) 9.1 7.9  Hemoglobin 13.0 - 18.0 g/dL 5.2(L) 12.8(L) 13.2  Hematocrit 40.0 - 52.0 % 15.9(L) 37.9(L) 39.1  Platelets 150 - 440 K/uL 209 195 158    STUDIES: Dg Chest Portable 1 View  Result Date: 06/21/2017 CLINICAL DATA:  Dizziness and weakness. EXAM: PORTABLE CHEST 1 VIEW COMPARISON:  Chest x-ray dated June 18, 2014. FINDINGS: The heart size and mediastinal contours are within normal limits. Both lungs are clear. The visualized skeletal structures are unremarkable. IMPRESSION: No active disease. Electronically Signed   By: Titus Dubin M.D.   On: 06/21/2017 14:08   @IMAGES @  Assessment: 1.  Symptomatic anemia-likely secondary to gastrointestinal blood loss.  2.  Melena-likely related to NSAID enteropathy versus peptic ulcer disease.  3.  Chronic NSAID use.  4.  Chronic back pain  Recommendations:  1.  Agree with blood transfusion given low hemoglobin of 5.2.  Would consider transfusing to hemoglobin of 8 or above.  2.  IV acid suppression with Protonix drip.  3.  Proceed with a EGD and when clinically feasible.  This has been planned tentatively for tomorrow.The patient understands the nature of the planned procedure, indications, risks, alternatives and potential complications including but not limited to  bleeding, infection, perforation, damage to internal organs and possible oversedation/side effects from anesthesia. The patient agrees and gives consent to proceed.  Please refer to procedure notes for findings, recommendations and patient disposition/instructions.  Thank you for the consult. Please call with questions or concerns.  Olean Ree, "Lanny Hurst MD [facility] [place], [place] [address] [phone]  06/21/2017 4:33 PM

## 2017-06-21 NOTE — ED Notes (Signed)
Family at bedside. 

## 2017-06-21 NOTE — ED Notes (Signed)
Attempting to call pt's wife and still unable to get in contact with her. Tried home and cell number multiple times. Pt aware

## 2017-06-21 NOTE — ED Provider Notes (Signed)
[facility] Emergency Department Provider Note  ____________________________________________  Time seen: Approximately 2:05 PM  I have reviewed the triage vital signs and the nursing notes.   HISTORY  Chief Complaint Dizziness    HPI Michael Stevens is a 77 y.o. male comes to the ED complaining of dizziness worsening over the last 3 days. He gets lightheaded when he stands up. He has decreased exercise tolerance and has to stop after walking a short distance to rest. Denies chest pain palpitations or shortness of breath. No syncope. However, he does have black stool for the past week. He reports taking NSAIDs frequently over the past several weeks due to low back pain for which she is seeing a Restaurant manager, fast food. Lightheadedness is intermittent, worse standing, better lying down. Reports his symptoms as moderate to severe, interfering with all of his ADLs.  Back pain is chronic, unchanging, not related to the new symptoms he is experiencing this week.     Past Medical History:  Diagnosis Date  . Allergic rhinitis   . Anemia   . Arthritis    Back  . Bladder neck obstruction   . GERD (gastroesophageal reflux disease)    ocassional  . Glaucoma   . Hypertension   . Impotence      Patient Active Problem List   Diagnosis Date Noted  . Abnormal EKG 01/31/2017  . Right bundle branch block 01/31/2017  . 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 Hyperglycemia 12/22/2015  . DDD (degenerative disc disease), lumbar 07/14/2015  . Anemia 01/14/2015  . Allergic rhinitis 10/13/2014  . 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 DD (diverticular disease) 10/13/2014  . Gastric catarrh 10/13/2014  . GERD (gastroesophageal reflux disease) 10/13/2014  . Glaucoma 10/13/2014  . Hypercholesteremia 10/13/2014  . Essential hypertension 10/13/2014     Past Surgical History:  Procedure Laterality Date  . BACK SURGERY  April 2011      Prior to Admission medications   Medication Sig Start Date End Date Taking? Authorizing Provider  aspirin EC 81 MG tablet Take 1 tablet (81 mg total) by mouth daily. 06/13/17   Arnetha Courser, MD  atorvastatin (LIPITOR) 20 MG tablet Take 1 tablet (20 mg total) by mouth at bedtime. 06/14/17   Lada, Satira Anis, MD  Camphor-Eucalyptus-Menthol (VICKS VAPORUB EX) Apply 1 application topically at bedtime. To clear nasal congestion    [provider]  latanoprost (XALATAN) 0.005 % ophthalmic solution Place 1 drop into both eyes at bedtime. 06/13/17   Arnetha Courser, MD     Allergies Patient has no known allergies.   Family History  Problem Relation Age of Onset  . Healthy Mother   . Healthy Brother     Social History Social History   Tobacco Use  . Smoking status: Former Smoker    Packs/day: 2.00    Years: 30.00    Pack years: 60.00    Types: Cigarettes    Last attempt to quit: 12/01/1982    Years since quitting: 34.5  . Smokeless tobacco: Never Used  . Tobacco comment: Quit 34 years ago  Substance Use Topics  . Alcohol use: Yes    Alcohol/week: 1.2 - 1.8 oz    Types: 2 - 3 Cans of beer per week    Comment: occasional  . Drug use: No    Review of Systems  Constitutional:   No fever or chills.  ENT:   No sore throat. No rhinorrhea. Cardiovascular:  No chest pain or syncope. Respiratory:   No dyspnea or cough. Gastrointestinal:   Negative for abdominal pain, vomiting and diarrhea. Positive black stool Musculoskeletal:  Positive chronic low back pain and right sided sciatica All other systems reviewed and are negative except as documented above in ROS and HPI.  ____________________________________________   PHYSICAL EXAM:  VITAL SIGNS: ED Triage Vitals  Enc Vitals Group     BP 06/21/17 1302 (!) 98/49     Pulse Rate 06/21/17 1302 (!) 120     Resp 06/21/17 1302 18     Temp 06/21/17 1302 97.8 F (36.6 C)     Temp Source 06/21/17 1302 Oral     SpO2  06/21/17 1302 100 %     Weight 06/21/17 1302 160 lb (72.6 kg)     Height 06/21/17 1302 5\' 5"  (1.651 m)     Head Circumference --      Peak Flow --      Pain Score 06/21/17 1346 7     Pain Loc --      Pain Edu? --      Excl. in [place]? --     Vital signs reviewed, nursing assessments reviewed.   Constitutional:   Alert and oriented. Not in distress. Eyes:   No scleral icterus.  EOMI. No nystagmus. Positive conjunctival pallor. PERRL. ENT   Head:   Normocephalic and atraumatic.   Nose:   No congestion/rhinnorhea.    Mouth/Throat:   Dry mucous membranes, no pharyngeal erythema. No peritonsillar mass.    Neck:   No meningismus. Full ROM. Hematological/Lymphatic/Immunilogical:   No cervical lymphadenopathy. Cardiovascular:   Tachycardia heart rate 115. Symmetric bilateral radial and DP pulses.  No murmurs.  Respiratory:   Normal respiratory effort without tachypnea/retractions. Breath sounds are clear and equal bilaterally. No wheezes/rales/rhonchi. Gastrointestinal:   Soft and nontender. Non distended. There is no CVA tenderness.  No rebound, rigidity, or guarding. Rectal exam performed with nurse Sam at bedside. Melanotic stool, strongly Hemoccult-positive. Genitourinary:   deferred Musculoskeletal:   Normal range of motion in all extremities. No joint effusions.  No lower extremity tenderness.  No edema. Neurologic:   Normal speech and language.  Motor grossly intact. No acute focal neurologic deficits are appreciated.  Skin:    Skin is warm, dry and intact. No rash noted.  No petechiae, purpura, or bullae.  ____________________________________________    LABS (pertinent positives/negatives) (all labs ordered are listed, but only abnormal results are displayed) Labs Reviewed  COMPREHENSIVE METABOLIC PANEL - Abnormal; Notable for the following components:      Result Value   CO2 19 (*)    Glucose, Bld 175 (*)    BUN 34 (*)    Calcium 8.7 (*)    Total Protein 6.0 (*)     Alkaline Phosphatase 32 (*)    GFR calc non Af Amer 59 (*)    All other components within normal limits  TROPONIN I  CBC WITH DIFFERENTIAL/PLATELET  URINALYSIS, COMPLETE (UACMP) WITH MICROSCOPIC  TYPE AND SCREEN   ____________________________________________   EKG  Interpreted by me Sinus tachycardia rate 119. Normal axis. Slight prolongation of QTC. Right bundle-branch block. No acute ischemic changes.  ____________________________________________    RADIOLOGY  No results found.  ____________________________________________   PROCEDURES .Critical Care Performed by: Carrie Mew, MD Authorized by: Carrie Mew, MD   Critical care provider statement:    Critical care time (minutes):  35   Critical care time was exclusive of:  Separately billable procedures  and treating other patients   Critical care was necessary to treat or prevent imminent or life-threatening deterioration of the following conditions:  Shock and circulatory failure   Critical care was time spent personally by me on the following activities:  Development of treatment plan with patient or surrogate, discussions with consultants, evaluation of patient's response to treatment, examination of patient, obtaining history from patient or surrogate, ordering and performing treatments and interventions, ordering and review of laboratory studies, ordering and review of radiographic studies, pulse oximetry, re-evaluation of patient's condition and review of old charts  .Critical Care Performed by: Carrie Mew, MD Authorized by: Carrie Mew, MD   Critical care provider statement:    Critical care time (minutes):  35   Critical care time was exclusive of:  Separately billable procedures and treating other patients   Critical care was necessary to treat or prevent imminent or life-threatening deterioration of the following conditions:  Shock and circulatory failure   Critical care was time  spent personally by me on the following activities:  Development of treatment plan with patient or surrogate, discussions with consultants, evaluation of patient's response to treatment, examination of patient, obtaining history from patient or surrogate, ordering and performing treatments and interventions, ordering and review of laboratory studies, ordering and review of radiographic studies, pulse oximetry, re-evaluation of patient's condition and review of old charts     ____________________________________________    CLINICAL IMPRESSION / [place] / ED COURSE  Pertinent labs & imaging results that were available during my care of the patient were reviewed by me and considered in my medical decision making (see chart for details).     Clinical Course as of Jun 22 1403  Wed Jun 21, 2017  1404 P/w pallor, melenotic stool. Suspect  nsaid gastropathy. Plan to admit for GI eval. Consents for transfusion as needed.   [PS]    Clinical Course User Index [PS] Carrie Mew, MD     ----------------------------------------- 2:40 PM on 06/21/2017 -----------------------------------------  Hemoglobin 5.2, acute drop compared to weeks ago. Consistent with presentation and clinical exam. I will start transfusion, 2 units of packed red blood cells. Case discussed with hospitalist for admission.  ____________________________________________   FINAL CLINICAL IMPRESSION(S) / ED DIAGNOSES    Final diagnoses:  Upper GI bleed  Symptomatic anemia     ED Discharge Orders    None      Portions of this note were generated with dragon dictation software. Dictation errors may occur despite best attempts at proofreading.    Carrie Mew, MD 06/21/17 [id number]

## 2017-06-22 ENCOUNTER — Inpatient Hospital Stay: Payer: Medicare Other | Admitting: Anesthesiology

## 2017-06-22 ENCOUNTER — Encounter
Admission: EM | Disposition: A | Discharge status: Home / Self Care | Payer: Self-pay | Source: Home / Self Care | Attending: Internal Medicine

## 2017-06-22 HISTORY — PX: ESOPHAGOGASTRODUODENOSCOPY: SHX5428

## 2017-06-22 LAB — CBC
HEMATOCRIT: 17.1 % — AB (ref 40.0–52.0)
HEMOGLOBIN: 5.7 g/dL — AB (ref 13.0–18.0)
MCH: 30.9 pg (ref 26.0–34.0)
MCHC: 33.4 g/dL (ref 32.0–36.0)
MCV: 92.5 fL (ref 80.0–100.0)
Platelets: 133 10*3/uL — ABNORMAL LOW (ref 150–440)
RBC: 1.85 MIL/uL — AB (ref 4.40–5.90)
RDW: 15.2 % — AB (ref 11.5–14.5)
WBC: 18.5 10*3/uL — AB (ref 3.8–10.6)

## 2017-06-22 LAB — BASIC METABOLIC PANEL
ANION GAP: 8 (ref 5–15)
BUN: 42 mg/dL — AB (ref 6–20)
CO2: 20 mmol/L — ABNORMAL LOW (ref 22–32)
Calcium: 7.9 mg/dL — ABNORMAL LOW (ref 8.9–10.3)
Chloride: 110 mmol/L (ref 101–111)
Creatinine, Ser: 0.98 mg/dL (ref 0.61–1.24)
Glucose, Bld: 119 mg/dL — ABNORMAL HIGH (ref 65–99)
POTASSIUM: 4.1 mmol/L (ref 3.5–5.1)
SODIUM: 138 mmol/L (ref 135–145)

## 2017-06-22 LAB — HEMOGLOBIN: Hemoglobin: 5.7 g/dL — ABNORMAL LOW (ref 13.0–18.0)

## 2017-06-22 LAB — GLUCOSE, CAPILLARY
GLUCOSE-CAPILLARY: 121 mg/dL — AB (ref 65–99)
GLUCOSE-CAPILLARY: 153 mg/dL — AB (ref 65–99)
Glucose-Capillary: 128 mg/dL — ABNORMAL HIGH (ref 65–99)
Glucose-Capillary: 126 mg/dL — ABNORMAL HIGH (ref 65–99)

## 2017-06-22 LAB — PREPARE RBC (CROSSMATCH)

## 2017-06-22 SURGERY — EGD (ESOPHAGOGASTRODUODENOSCOPY)
Anesthesia: General

## 2017-06-22 MED ORDER — PHENYLEPHRINE HCL 10 MG/ML IJ SOLN
INTRAMUSCULAR | Status: DC | PRN
  Administered 2017-06-22: 200 ug via INTRAVENOUS
  Administered 2017-06-22: 100 ug via INTRAVENOUS
  Administered 2017-06-22 (×2): 200 ug via INTRAVENOUS
  Administered 2017-06-22: 300 ug via INTRAVENOUS
  Administered 2017-06-22: 200 ug via INTRAVENOUS

## 2017-06-22 MED ORDER — PROPOFOL 500 MG/50ML IV EMUL
INTRAVENOUS | Status: AC
  Filled 2017-06-22: qty 50

## 2017-06-22 MED ORDER — PROPOFOL 10 MG/ML IV BOLUS
INTRAVENOUS | Status: DC | PRN
  Administered 2017-06-22: 60 mg via INTRAVENOUS
  Administered 2017-06-22: 20 mg via INTRAVENOUS

## 2017-06-22 MED ORDER — PROPOFOL 500 MG/50ML IV EMUL
INTRAVENOUS | Status: DC | PRN
  Administered 2017-06-22: 140 ug/kg/min via INTRAVENOUS

## 2017-06-22 MED ORDER — EPHEDRINE SULFATE 50 MG/ML IJ SOLN
INTRAMUSCULAR | Status: AC
  Filled 2017-06-22: qty 1

## 2017-06-22 MED ORDER — SODIUM CHLORIDE 0.9 % IV SOLN
INTRAVENOUS | Status: DC
  Administered 2017-06-22: 15:00:00 via INTRAVENOUS

## 2017-06-22 MED ORDER — SODIUM CHLORIDE 0.9 % IV SOLN
Freq: Once | INTRAVENOUS | Status: AC
  Administered 2017-06-22: 23:00:00 via INTRAVENOUS

## 2017-06-22 MED ORDER — TIZANIDINE HCL 2 MG PO TABS
2.0000 mg | ORAL_TABLET | Freq: Three times a day (TID) | ORAL | Status: DC | PRN
  Filled 2017-06-22: qty 1

## 2017-06-22 MED ORDER — EPHEDRINE SULFATE 50 MG/ML IJ SOLN
INTRAMUSCULAR | Status: DC | PRN
  Administered 2017-06-22: 10 mg via INTRAVENOUS

## 2017-06-22 MED ORDER — PROPOFOL 10 MG/ML IV BOLUS: INTRAVENOUS | Status: DC | PRN

## 2017-06-22 MED ORDER — EPINEPHRINE PF 1 MG/10ML IJ SOSY
PREFILLED_SYRINGE | INTRAMUSCULAR | Status: DC | PRN
  Administered 2017-06-22: 1 mg via SUBCUTANEOUS

## 2017-06-22 NOTE — Transfer of Care (Signed)
Immediate Anesthesia Transfer of Care Note  Patient: Michael Stevens  Procedure(s) Performed: ESOPHAGOGASTRODUODENOSCOPY (EGD) (N/A )  Patient Location: PACU  Anesthesia Type:General  Level of Consciousness: awake and alert   Airway & Oxygen Therapy: Patient Spontanous Breathing and Patient connected to nasal cannula oxygen  Post-op Assessment: Report given to RN and Post -op Vital signs reviewed and stable  Post vital signs: Reviewed and stable  Last Vitals:  Vitals:   06/22/17 1405 06/22/17 1522  BP: (!) 101/43 108/63  Pulse: (!) 120 97  Resp: 16 18  Temp: 36.8 C (!) 36.1 C  SpO2:  100%    Last Pain:  Vitals:   06/22/17 1405  TempSrc: Oral  PainSc:          Complications: No apparent anesthesia complications

## 2017-06-22 NOTE — Anesthesia Postprocedure Evaluation (Signed)
Anesthesia Post Note  Patient: Michael Stevens  Procedure(s) Performed: ESOPHAGOGASTRODUODENOSCOPY (EGD) (N/A )  Patient location during evaluation: Endoscopy Anesthesia Type: General Level of consciousness: awake and alert Pain management: pain level controlled Vital Signs Assessment: post-procedure vital signs reviewed and stable Respiratory status: spontaneous breathing and respiratory function stable Cardiovascular status: stable Anesthetic complications: no     Last Vitals:  Vitals:   06/22/17 1405 06/22/17 1522  BP: (!) 101/43 108/63  Pulse: (!) 120 97  Resp: 16 18  Temp: 36.8 C (!) 36.1 C  SpO2:  100%    Last Pain:  Vitals:   06/22/17 1405  TempSrc: Oral  PainSc:                  KEPHART,WILLIAM K

## 2017-06-22 NOTE — Anesthesia Post-op Follow-up Note (Signed)
Anesthesia QCDR form completed.        

## 2017-06-22 NOTE — Progress Notes (Signed)
[place] [facility] [place] NAME: Michael Stevens    MR#:  [id number]  DATE OF BIRTH:  16-Feb-1941  SUBJECTIVE:   Patient still having melena. Has epigastric abdominal pain.  REVIEW OF SYSTEMS:    Review of Systems  Constitutional: Negative for fever, chills weight loss HENT: Negative for ear pain, nosebleeds, congestion, facial swelling, rhinorrhea, neck pain, neck stiffness and ear discharge.   Respiratory: Negative for cough, shortness of breath, wheezing  Cardiovascular: Negative for chest pain, palpitations and leg swelling.  Gastrointestinal: Positive for melena and epigastric abdominal pain.   Genitourinary: Negative for dysuria, urgency, frequency, hematuria Musculoskeletal: Negative for back pain or joint pain Neurological: Negative for dizziness, seizures, syncope, focal weakness,  numbness and headaches.  Hematological: Does not bruise/bleed easily.  Psychiatric/Behavioral: Negative for hallucinations, confusion, dysphoric mood    Tolerating Diet: npo      DRUG ALLERGIES:  No Known Allergies  VITALS:  Blood pressure (!) 108/55, pulse 98, temperature 98.3 F (36.8 C), temperature source Oral, resp. rate 18, height 5\' 5"  (1.651 m), weight 73.1 kg (161 lb 3.2 oz), SpO2 100 %.  PHYSICAL EXAMINATION:  Constitutional: Appears well-developed and well-nourished. No distress. HENT: Normocephalic. Marland Kitchen Oropharynx is clear and moist.  Eyes: Conjunctivae and EOM are normal. PERRLA, no scleral icterus.  Neck: Normal ROM. Neck supple. No JVD. No tracheal deviation. CVS: RRR, S1/S2 +, no murmurs, no gallops, no carotid bruit.  Pulmonary: Effort and breath sounds normal, no stridor, rhonchi, wheezes, rales.  Abdominal: Soft. BS +,  no distension, tenderness, rebound or guarding.  Musculoskeletal: Normal range of motion. No edema and no tenderness.  Neuro: Alert. CN 2-12 grossly intact. No focal deficits. Skin: Skin is warm and dry. No rash  noted. Psychiatric: Normal mood and affect.      LABORATORY PANEL:   CBC Recent Labs  Lab 06/22/17 0535  WBC 18.5*  HGB 5.7*  HCT 17.1*  PLT 133*   ------------------------------------------------------------------------------------------------------------------  Chemistries  Recent Labs  Lab 06/21/17 1326 06/22/17 0535  NA 135 138  K 4.0 4.1  CL 104 110  CO2 19* 20*  GLUCOSE 175* 119*  BUN 34* 42*  CREATININE 1.16 0.98  CALCIUM 8.7* 7.9*  AST 31  --   ALT 19  --   ALKPHOS 32*  --   BILITOT 0.7  --    ------------------------------------------------------------------------------------------------------------------  Cardiac Enzymes Recent Labs  Lab 06/21/17 1326  TROPONINI <0.03   ------------------------------------------------------------------------------------------------------------------  RADIOLOGY:  Dg Chest Portable 1 View  Result Date: 06/21/2017 CLINICAL DATA:  Dizziness and weakness. EXAM: PORTABLE CHEST 1 VIEW COMPARISON:  Chest x-ray dated June 18, 2014. FINDINGS: The heart size and mediastinal contours are within normal limits. Both lungs are clear. The visualized skeletal structures are unremarkable. IMPRESSION: No active disease. Electronically Signed   By: Titus Dubin M.D.   On: 06/21/2017 14:08     ASSESSMENT AND PLAN:   78 year old male with history of essential hypertension and chronic back pain who presents with dizziness and melena.  1. Acute blood loss anemia due to Upper GI bleed: Patient has been taking NSAIDs which is likely the etiology of upper GI bleed. Patient has received 2 units of PRBCs. Hemoglobin is still hovering around the same between 5-6. 2 more units ordered this morning. Plan for EGD Continue PPI Avoid NSAIDs and aspirin for now  2. Acute blood loss anemia due to GI bleed: Patient will receive a total 4 units. See  3. Hyperlipidemia:  Continue statin  4. Glaucoma: Continue eyedrops  5. Chronic back pain:  Continue Zanaflex and avoid NSAIDs     Management plans discussed with the patient and he is in agreement.  CODE STATUS: Full  TOTAL TIME TAKING CARE OF THIS PATIENT: 30 minutes.     POSSIBLE D/C 2-4 days, DEPENDING ON CLINICAL CONDITION.   Kahealani Yankovich M.D on 06/22/2017 at 10:40 AM  Between 7am to 6pm - Pager - [phone] After 6pm go to [email or web address] - password [email or web address] [facility]  Office  [phone]  CC: Primary care physician; Arnetha Courser, MD  Note: This dictation was prepared with Dragon dictation along with smaller phrase technology. Any transcriptional errors that result from this process are unintentional.

## 2017-06-22 NOTE — Op Note (Signed)
[facility] Gastroenterology Patient Name: Michael Stevens Procedure Date: 06/22/2017 2:43 PM MRN: [id number] Account #: [id number] Date of Birth: 11-30-1940 Admit Type: Inpatient Age: 77 Room: [facility] ENDO ROOM 2 Gender: Male Note Status: Finalized Procedure:            Upper GI endoscopy Indications:          Active gastrointestinal bleeding, Suspected upper                        gastrointestinal bleeding Providers:            Benay Pike. Alice Reichert MD, MD Referring MD:         Arnetha Courser (Referring MD) Medicines:            Propofol per Anesthesia Complications:        No immediate complications. Procedure:            Pre-Anesthesia Assessment:                       - The risks and benefits of the procedure and the                        sedation options and risks were discussed with the                        patient. All questions were answered and informed                        consent was obtained.                       - Patient identification and proposed procedure were                        verified prior to the procedure by the nurse. The                        procedure was verified in the procedure room.                       - ASA Grade Assessment: III - A patient with severe                        systemic disease.                       - After reviewing the risks and benefits, the patient                        was deemed in satisfactory condition to undergo the                        procedure.                       After obtaining informed consent, the endoscope was                        passed under direct vision. Throughout the procedure,  the patient's blood pressure, pulse, and oxygen                        saturations were monitored continuously. The Endoscope                        was introduced through the mouth, and advanced to the                        third part of duodenum. The upper GI endoscopy was             accomplished without difficulty. The patient tolerated                        the procedure well. Findings:      A mild Schatzki ring (acquired) was found in the lower third of the       esophagus.      The entire examined stomach was normal.      The cardia and gastric fundus were normal on retroflexion.      A single spot with bleeding was found in the second portion of the       duodenum. Area was successfully injected with 1 mL of a 1:10,000       solution of epinephrine for hemostasis.      One oozing superficial duodenal ulcer with adherent clot was found in       the second portion of the duodenum. The lesion was 3 mm in largest       dimension. Coagulation for hemostasis using bipolar probe was successful.      The exam was otherwise without abnormality. Impression:           - Mild Schatzki ring.                       - Normal stomach.                       - A single spot with bleeding in the duodenum. Injected.                       - One oozing duodenal ulcer with adherent clot. Treated                        with bipolar cautery.                       - The examination was otherwise normal.                       - No specimens collected. Recommendation:       - Return patient to hospital ward for ongoing care.                       - Clear liquid diet today. 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 All rights reserved. The codes documented in this report are preliminary and upon coder review may  be revised to  meet current compliance requirements. Efrain Sella MD, MD 06/22/2017 3:14:47 PM This report has been signed electronically. Number of Addenda: 0 Note Initiated On: 06/22/2017 2:43 PM      [facility]

## 2017-06-22 NOTE — Interval H&P Note (Signed)
History and Physical Interval Note:  06/22/2017 2:11 PM  Michael Stevens  has presented today for surgery, with the diagnosis of GI Bleed  The various methods of treatment have been discussed with the patient and family. After consideration of risks, benefits and other options for treatment, the patient has consented to  Procedure(s): ESOPHAGOGASTRODUODENOSCOPY (EGD) (N/A) as a surgical intervention .  The patient's history has been reviewed, patient examined, no change in status, stable for surgery.  I have reviewed the patient's chart and labs.  Questions were answered to the patient's satisfaction.     [place], Armorel

## 2017-06-22 NOTE — Anesthesia Preprocedure Evaluation (Signed)
Anesthesia Evaluation  Patient identified by MRN, date of birth, ID band Patient awake    Reviewed: Allergy & Precautions, NPO status , Patient's Chart, lab work & pertinent test results  History of Anesthesia Complications Negative for: history of anesthetic complications  Airway Mallampati: II  TM Distance: >3 FB Neck ROM: Full    Dental no notable dental hx.    Pulmonary neg sleep apnea, neg COPD, former smoker,    breath sounds clear to auscultation- rhonchi (-) wheezing      Cardiovascular hypertension, Pt. 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 No date: GERD (gastroesophageal reflux disease)     Comment:  ocassional No date: Glaucoma No date: Hypertension No date: Impotence   Reproductive/Obstetrics                             Anesthesia Physical Anesthesia Plan  ASA: II  Anesthesia Plan: General   Post-op Pain Management:    Induction: Intravenous  PONV Risk Score and Plan: 1 and Propofol infusion  Airway Management Planned: Natural Airway  Additional Equipment:   Intra-op Plan:   Post-operative Plan:   Informed Consent: I have reviewed the patients History and Physical, chart, labs and discussed the procedure including the risks, benefits and alternatives for the proposed anesthesia with the patient or authorized representative  who has indicated his/her understanding and acceptance.   Dental advisory given  Plan Discussed with: CRNA and Anesthesiologist  Anesthesia Plan Comments: (Transfused 2 units last night with little change in Hgb. Currently has another unit transfusing.)        Anesthesia Quick Evaluation

## 2017-06-22 NOTE — Progress Notes (Signed)
Blood report stopped at 1522 while pt was in procedure. Flow sheet not filled out. Changed flow rate according to green blood product sheet marking.

## 2017-06-22 NOTE — OR Nursing (Signed)
REPORT TO BRANDI ON 1C. DX DUODENAL ULCER. PT INCONTINENT OF DARK BLOOD STOOL. ATTENDS APPLIED AFTER PERI-CARE. PT TO START ON CLEAR LIQUIDS. TRANSPORTED [place]

## 2017-06-23 ENCOUNTER — Encounter: Payer: Self-pay | Admitting: Internal Medicine

## 2017-06-23 LAB — GLUCOSE, CAPILLARY
GLUCOSE-CAPILLARY: 119 mg/dL — AB (ref 65–99)
GLUCOSE-CAPILLARY: 90 mg/dL (ref 65–99)
GLUCOSE-CAPILLARY: 109 mg/dL — AB (ref 65–99)
Glucose-Capillary: 119 mg/dL — ABNORMAL HIGH (ref 65–99)

## 2017-06-23 LAB — CBC
HCT: 20.8 % — ABNORMAL LOW (ref 40.0–52.0)
HEMOGLOBIN: 7.1 g/dL — AB (ref 13.0–18.0)
MCH: 30.8 pg (ref 26.0–34.0)
MCHC: 34 g/dL (ref 32.0–36.0)
MCV: 90.7 fL (ref 80.0–100.0)
PLATELETS: 135 10*3/uL — AB (ref 150–440)
RBC: 2.29 MIL/uL — ABNORMAL LOW (ref 4.40–5.90)
RDW: 14.6 % — AB (ref 11.5–14.5)
WBC: 27.3 10*3/uL — ABNORMAL HIGH (ref 3.8–10.6)

## 2017-06-23 NOTE — Care Management Note (Signed)
Case Management Note  Patient Details  Name: RYANN LEAVITT MRN: [id number] Date of Birth: Nov 29, 1940  Subjective/Objective:  Admitted to [facility] with the diagnosis of GI bleed. Lives with wife, Jeannene Patella [phone]). Last seen Dr. Sanda Klein 06/13/17. Prescriptions are filled at [facility] in [place] nurse visits yearly for physical. No skilled nursing. No home oxygen. No medical equipment in the home.   Takes care of all basic activities of daily living himself, drives. [place] off ladder 6-7 years ago. Good appetite. Wife will transport                 Action/Plan: Will continue to follow for discharge plans   Expected Discharge Date:                  Expected Discharge Plan:     In-House Referral:   yes  Discharge planning Services   yes  Post Acute Care Choice:    Choice offered to:     DME Arranged:    DME Agency:     HH Arranged:    [place] Agency:     Status of Service:     If discussed at [employer] of Stay Meetings, dates discussed:    Additional Comments:  Shelbie Ammons, RN MSN CCM Care Management [phone] 06/23/2017, 10:48 AM

## 2017-06-23 NOTE — Progress Notes (Signed)
[place] at [place] NAME: Michael Stevens    MR#:  [id number]  DATE OF BIRTH:  Jul 27, 1940  SUBJECTIVE:   Nurse reports dark-colored stool this morning. Patient denies abdominal pain.  REVIEW OF SYSTEMS:    Review of Systems  Constitutional: Negative for fever, chills weight loss HENT: Negative for ear pain, nosebleeds, congestion, facial swelling, rhinorrhea, neck pain, neck stiffness and ear discharge.   Respiratory: Negative for cough, shortness of breath, wheezing  Cardiovascular: Negative for chest pain, palpitations and leg swelling.  Gastrointestinal: Positive for melena and denies epigastric abdominal pain.   Genitourinary: Negative for dysuria, urgency, frequency, hematuria Musculoskeletal: Negative for back pain or joint pain Neurological: Negative for dizziness, seizures, syncope, focal weakness,  numbness and headaches.  Hematological: Does not bruise/bleed easily.  Psychiatric/Behavioral: Negative for hallucinations, confusion, dysphoric mood    Tolerating Diet: npo      DRUG ALLERGIES:  No Known Allergies  VITALS:  Blood pressure (!) 116/58, pulse 95, temperature 97.9 F (36.6 C), temperature source Oral, resp. rate 16, height 5\' 5"  (1.651 m), weight 73.1 kg (161 lb 3.2 oz), SpO2 100 %.  PHYSICAL EXAMINATION:  Constitutional: Appears well-developed and well-nourished. No distress. HENT: Normocephalic. Marland Kitchen Oropharynx is clear and moist.  Eyes: Conjunctivae and EOM are normal. PERRLA, no scleral icterus.  Neck: Normal ROM. Neck supple. No JVD. No tracheal deviation. CVS: RRR, S1/S2 +, no murmurs, no gallops, no carotid bruit.  Pulmonary: Effort and breath sounds normal, no stridor, rhonchi, wheezes, rales.  Abdominal: Soft. BS +,  no distension, tenderness, rebound or guarding.  Musculoskeletal: Normal range of motion. No edema and no tenderness.  Neuro: Alert. CN 2-12 grossly intact. No focal deficits. Skin: Skin is  warm and dry. No rash noted. Psychiatric: Normal mood and affect.      LABORATORY PANEL:   CBC Recent Labs  Lab 06/23/17 0700  WBC 27.3*  HGB 7.1*  HCT 20.8*  PLT 135*   ------------------------------------------------------------------------------------------------------------------  Chemistries  Recent Labs  Lab 06/21/17 1326 06/22/17 0535  NA 135 138  K 4.0 4.1  CL 104 110  CO2 19* 20*  GLUCOSE 175* 119*  BUN 34* 42*  CREATININE 1.16 0.98  CALCIUM 8.7* 7.9*  AST 31  --   ALT 19  --   ALKPHOS 32*  --   BILITOT 0.7  --    ------------------------------------------------------------------------------------------------------------------  Cardiac Enzymes Recent Labs  Lab 06/21/17 1326  TROPONINI <0.03   ------------------------------------------------------------------------------------------------------------------  RADIOLOGY:  Dg Chest Portable 1 View  Result Date: 06/21/2017 CLINICAL DATA:  Dizziness and weakness. EXAM: PORTABLE CHEST 1 VIEW COMPARISON:  Chest x-ray dated June 18, 2014. FINDINGS: The heart size and mediastinal contours are within normal limits. Both lungs are clear. The visualized skeletal structures are unremarkable. IMPRESSION: No active disease. Electronically Signed   By: Titus Dubin M.D.   On: 06/21/2017 14:08     ASSESSMENT AND PLAN:   77 year old male with history of essential hypertension and chronic back pain who presents with dizziness and melena.  1. Upper GI bleed: Patient has been taking NSAIDs which is etiology of upper GI bleed. EGD shows oozing duodenal ulcer with adherent clot Continued IV PPI for another 24 hours. Transition to oral PPI twice a day Patient will need repeat EGD in 8 weeks to assure healing  2. Acute blood loss anemia due to GI bleed: Patient will receive a total 4 units.  Repeat hemoglobin a.m. Hemoglobin up to 7.1 this  morning  3. Hyperlipidemia: Continue statin  4. Glaucoma: Continue  eyedrops  5. Chronic back pain: Continue Zanaflex and avoid NSAIDs  D/w dr Alice Reichert   Management plans discussed with the patient and he is in agreement.  CODE STATUS: Full  TOTAL TIME TAKING CARE OF THIS PATIENT: 25 minutes.     POSSIBLE D/C tomorrow DEPENDING ON CLINICAL CONDITION.   Michael Stevens M.D on 06/23/2017 at 10:22 AM  Between 7am to 6pm - Pager - [phone] After 6pm go to [email or web address] - password [email or web address] [facility]  Office  [phone]  CC: Primary care physician; Arnetha Courser, MD  Note: This dictation was prepared with Dragon dictation along with smaller phrase technology. Any transcriptional errors that result from this process are unintentional.

## 2017-06-23 NOTE — Progress Notes (Signed)
Subjective: Since I last evaluated the patient he has improved. NAD. Bleeding appears resolved.  Objective: Vital signs in last 24 hours: Temp:  [97.8 F (36.6 C)-98.5 F (36.9 C)] 97.8 F (36.6 C) (03/08 1421) Pulse Rate:  [85-114] 90 (03/08 1421) Resp:  [16-20] 20 (03/08 1421) BP: (111-127)/(47-76) 127/64 (03/08 1421) SpO2:  [99 %-100 %] 100 % (03/08 1421) Last BM Date: 06/22/17  Intake/Output from previous day: 03/07 0701 - 03/08 0700 In: 1320.8 [I.V.:1000.8; Blood:320] Out: 325 [Urine:325] Intake/Output this shift: Total I/O In: 1320 [P.O.:1320] Out: -   General appearance: alert, cooperative and appears stated age Resp: clear to auscultation bilaterally Cardio: regular rate and rhythm, S1, S2 normal, no murmur, click, rub or gallop GI: soft, non-tender; bowel sounds normal; no masses,  no organomegaly Extremities: extremities normal, atraumatic, no cyanosis or edema  Lab Results: Recent Labs    06/21/17 1326 06/22/17 0535 06/22/17 1923 06/23/17 0700  WBC 20.8* 18.5*  --  27.3*  HGB 5.2* 5.7* 5.7* 7.1*  HCT 15.9* 17.1*  --  20.8*  PLT 209 133*  --  135*   BMET Recent Labs    06/21/17 1326 06/22/17 0535  NA 135 138  K 4.0 4.1  CL 104 110  CO2 19* 20*  GLUCOSE 175* 119*  BUN 34* 42*  CREATININE 1.16 0.98  CALCIUM 8.7* 7.9*   LFT Recent Labs    06/21/17 1326  PROT 6.0*  ALBUMIN 3.5  AST 31  ALT 19  ALKPHOS 32*  BILITOT 0.7   PT/INR No results for input(s): LABPROT, INR in the last 72 hours. Hepatitis Panel No results for input(s): HEPBSAG, HCVAB, HEPAIGM, HEPBIGM in the last 72 hours. C-Diff No results for input(s): CDIFFTOX in the last 72 hours. No results for input(s): CDIFFPCR in the last 72 hours. Fecal Lactopherrin No results for input(s): FECLLACTOFRN in the last 72 hours.  Studies/Results: No results found.  Medications: I have reviewed the patient's current medications.  Assessment/Plan: 1. UGI bleed secondary to duodenal  ulcer - s/p EGD with injection and bicap cautery hemostasis. Stable.  Recommend advancing diet, switch to po PPI bid x 1 month then ojnce daily. Follow up in my office in one month [phone].  Case discussed with Dr. Benjie Karvonen.    LOS: 2 days   Olean Ree 06/23/2017, 6:20 PM

## 2017-06-23 NOTE — Progress Notes (Signed)
Wallet obtained from secuirty to be taken home with spouse prior to end of day shift 06/23/2017

## 2017-06-23 NOTE — Care Management Important Message (Signed)
Important Message  Patient Details  Name: Michael Stevens MRN: [id number] Date of Birth: [birth year]   Medicare Important Message Given:  Yes    Shelbie Ammons, RN 06/23/2017, 6:41 AM

## 2017-06-24 LAB — CBC
HCT: 18.8 % — ABNORMAL LOW (ref 40.0–52.0)
Hemoglobin: 6.2 g/dL — ABNORMAL LOW (ref 13.0–18.0)
MCH: 30.9 pg (ref 26.0–34.0)
MCHC: 33.1 g/dL (ref 32.0–36.0)
MCV: 93.2 fL (ref 80.0–100.0)
PLATELETS: 116 10*3/uL — AB (ref 150–440)
RBC: 2.01 MIL/uL — AB (ref 4.40–5.90)
RDW: 15.5 % — AB (ref 11.5–14.5)
WBC: 18.5 10*3/uL — AB (ref 3.8–10.6)

## 2017-06-24 LAB — HEMOGLOBIN AND HEMATOCRIT, BLOOD
HEMATOCRIT: 23.2 % — AB (ref 40.0–52.0)
Hemoglobin: 7.7 g/dL — ABNORMAL LOW (ref 13.0–18.0)

## 2017-06-24 LAB — PREPARE RBC (CROSSMATCH)

## 2017-06-24 MED ORDER — SODIUM CHLORIDE 0.9 % IV SOLN
Freq: Once | INTRAVENOUS | Status: AC
  Administered 2017-06-24: 10:00:00 via INTRAVENOUS

## 2017-06-24 MED ORDER — PANTOPRAZOLE SODIUM 40 MG IV SOLR
8.0000 mg/h | INTRAVENOUS | Status: DC
  Administered 2017-06-24: 18:00:00 8 mg/h via INTRAVENOUS
  Filled 2017-06-24: qty 80

## 2017-06-24 NOTE — Plan of Care (Signed)
  Education: Knowledge of General Education information will improve 06/24/2017 1851 - Progressing by Herbie Baltimore, RN   Health Behavior/Discharge Planning: Ability to manage health-related needs will improve 06/24/2017 1851 - Progressing by Herbie Baltimore, RN   Clinical Measurements: Ability to maintain clinical measurements within normal limits will improve 06/24/2017 1851 - Progressing by Herbie Baltimore, RN Will remain free from infection 06/24/2017 1851 - Progressing by Herbie Baltimore, RN Diagnostic test results will improve 06/24/2017 1851 - Progressing by Herbie Baltimore, RN Respiratory complications will improve 06/24/2017 1851 - Progressing by Herbie Baltimore, RN Cardiovascular complication will be avoided 06/24/2017 1851 - Progressing by Herbie Baltimore, RN   Activity: Risk for activity intolerance will decrease 06/24/2017 1851 - Progressing by Herbie Baltimore, RN   Nutrition: Adequate nutrition will be maintained 06/24/2017 1851 - Progressing by Herbie Baltimore, RN   Coping: Level of anxiety will decrease 06/24/2017 1851 - Progressing by Herbie Baltimore, RN   Elimination: Will not experience complications related to bowel motility 06/24/2017 1851 - Progressing by Herbie Baltimore, RN Will not experience complications related to urinary retention 06/24/2017 1851 - Progressing by Herbie Baltimore, RN   Pain Managment: General experience of comfort will improve 06/24/2017 1851 - Progressing by Herbie Baltimore, RN   Safety: Ability to remain free from injury will improve 06/24/2017 1851 - Progressing by Herbie Baltimore, RN   Skin Integrity: Risk for impaired skin integrity will decrease 06/24/2017 1851 - Progressing by Herbie Baltimore, RN

## 2017-06-24 NOTE — Progress Notes (Signed)
[place] at [place] NAME: Michael Stevens    MR#:  [id number]  DATE OF BIRTH:  1940-08-07  SUBJECTIVE:  CHIEF COMPLAINT:   Chief Complaint  Patient presents with  . Dizziness   - denies any more bloody stools. No abdominal pain. -Hemoglobin dropped again to 6.2  REVIEW OF SYSTEMS:  Review of Systems  Constitutional: Positive for malaise/fatigue. Negative for chills and fever.  HENT: Negative for hearing loss.   Eyes: Negative for blurred vision and double vision.  Respiratory: Negative for cough, shortness of breath and wheezing.   Cardiovascular: Negative for chest pain and palpitations.  Gastrointestinal: Negative for abdominal pain, constipation, diarrhea, nausea and vomiting.  Genitourinary: Negative for dysuria.  Musculoskeletal: Negative for myalgias.  Neurological: Negative for dizziness, speech change, focal weakness, seizures and headaches.  Psychiatric/Behavioral: Negative for depression.    DRUG ALLERGIES:  No Known Allergies  VITALS:  Blood pressure 125/60, pulse 98, temperature 98.5 F (36.9 C), temperature source Oral, resp. rate 16, height 5\' 5"  (1.651 m), weight 73.1 kg (161 lb 3.2 oz), SpO2 100 %.  PHYSICAL EXAMINATION:  Physical Exam   GENERAL:  77 y.o.-year-old patient lying in the bed with no acute distress.  EYES: Pupils equal, round, reactive to light and accommodation. No scleral icterus. Extraocular muscles intact.  HEENT: Head atraumatic, normocephalic. Oropharynx and nasopharynx clear.  NECK:  Supple, no jugular venous distention. No thyroid enlargement, no tenderness.  LUNGS: Normal breath sounds bilaterally, no wheezing, rales,rhonchi or crepitation. No use of accessory muscles of respiration.  CARDIOVASCULAR: S1, S2 normal. No murmurs, rubs, or gallops.  ABDOMEN: Soft, nontender, nondistended. Bowel sounds present. No organomegaly or mass.  EXTREMITIES: left arm edema noted. No pedal edema,  cyanosis, or clubbing.  NEUROLOGIC: Cranial nerves II through XII are intact. Muscle strength 5/5 in all extremities. Sensation intact. Gait not checked.  PSYCHIATRIC: The patient is alert and oriented x 3.  SKIN: No obvious rash, lesion, or ulcer.    LABORATORY PANEL:   CBC Recent Labs  Lab 06/24/17 0422  WBC 18.5*  HGB 6.2*  HCT 18.8*  PLT 116*   ------------------------------------------------------------------------------------------------------------------  Chemistries  Recent Labs  Lab 06/21/17 1326 06/22/17 0535  NA 135 138  K 4.0 4.1  CL 104 110  CO2 19* 20*  GLUCOSE 175* 119*  BUN 34* 42*  CREATININE 1.16 0.98  CALCIUM 8.7* 7.9*  AST 31  --   ALT 19  --   ALKPHOS 32*  --   BILITOT 0.7  --    ------------------------------------------------------------------------------------------------------------------  Cardiac Enzymes Recent Labs  Lab 06/21/17 1326  TROPONINI <0.03   ------------------------------------------------------------------------------------------------------------------  RADIOLOGY:  No results found.  EKG:   Orders placed or performed during the hospital encounter of 06/21/17  . EKG 12-Lead  . EKG 12-Lead    ASSESSMENT AND PLAN:   77 year old male with past medical history significant for hypertension, arthritis, GERD presents to hospital secondary to weakness and shortness of breath and noted to have a hemoglobin of 5.2.  1. Acute blood loss anemia secondary to GI bleed-a sign hemoglobin was 12.8 about 2 weeks ago. -Complaining of melena. Appreciate GI consult -Colonoscopy in the past was normal. -EGD done this admission showing a large duodenal ulcer that was bleeding and hemostasis achieved by injecting epinephrine. -Currently on a regular diet. -Received 4 units transfusion this admission and hemoglobin still dropping -No active bleeding at this time. One more unit packed RBC ordered for today. -remains  on Protonix drip  today-changed to IV twice a day tomorrow. -Keep hemoglobin greater than 7. Start iron at discharge  2. Leukocytosis-could be stress reaction. Get a differential in a.m. -  -urinalysis negative for any infection  3. Hyperlipidemia-continue statin  4. DVT prophylaxis-Ted's and SCDs. Encourage ambulation   All the records are reviewed and case discussed with Care Management/Social Workerr. Management plans discussed with the patient, family and they are in agreement.  CODE STATUS: Full code  TOTAL TIME TAKING CARE OF THIS PATIENT: 38 minutes.   POSSIBLE D/C TOMORROW, DEPENDING ON CLINICAL CONDITION.   Gladstone Lighter M.D on 06/24/2017 at 12:39 PM  Between 7am to 6pm - Pager - [phone]  After 6pm go to [email or web address] - password [email or web address] [facility]  Office  [phone]  CC: Primary care physician; Arnetha Courser, MD

## 2017-06-25 LAB — CBC WITH DIFFERENTIAL/PLATELET
BASOS PCT: 0 %
Basophils Absolute: 0.1 10*3/uL (ref 0–0.1)
EOS PCT: 2 %
Eosinophils Absolute: 0.3 10*3/uL (ref 0–0.7)
HCT: 21.6 % — ABNORMAL LOW (ref 40.0–52.0)
Hemoglobin: 7.1 g/dL — ABNORMAL LOW (ref 13.0–18.0)
LYMPHS ABS: 2.3 10*3/uL (ref 1.0–3.6)
Lymphocytes Relative: 17 %
MCH: 30.7 pg (ref 26.0–34.0)
MCHC: 33 g/dL (ref 32.0–36.0)
MCV: 93 fL (ref 80.0–100.0)
Monocytes Absolute: 1.3 10*3/uL — ABNORMAL HIGH (ref 0.2–1.0)
Monocytes Relative: 10 %
Neutro Abs: 10 10*3/uL — ABNORMAL HIGH (ref 1.4–6.5)
Neutrophils Relative %: 71 %
Platelets: 119 10*3/uL — ABNORMAL LOW (ref 150–440)
RBC: 2.32 MIL/uL — AB (ref 4.40–5.90)
RDW: 15.6 % — AB (ref 11.5–14.5)
WBC: 14 10*3/uL — AB (ref 3.8–10.6)

## 2017-06-25 LAB — TYPE AND SCREEN
ABO/RH(D): O NEG
Antibody Screen: NEGATIVE
UNIT DIVISION: 0
UNIT DIVISION: 0
Unit division: 0
Unit division: 0
Unit division: 0
Unit division: 0

## 2017-06-25 LAB — BPAM RBC
Blood Product Expiration Date: 201903132359
Blood Product Expiration Date: 201903132359
Blood Product Expiration Date: 201903142359
Blood Product Expiration Date: 201903312359
Blood Product Expiration Date: 201903312359
Blood Product Expiration Date: 201904042359
ISSUE DATE / TIME: 201903061726
ISSUE DATE / TIME: 201903062201
ISSUE DATE / TIME: 201903071337
ISSUE DATE / TIME: 201903072315
ISSUE DATE / TIME: 201903091040
Unit Type and Rh: 9500
Unit Type and Rh: 9500
Unit Type and Rh: 9500
Unit Type and Rh: 9500
Unit Type and Rh: 9500
Unit Type and Rh: 9500

## 2017-06-25 LAB — BASIC METABOLIC PANEL
ANION GAP: 5 (ref 5–15)
BUN: 16 mg/dL (ref 6–20)
CO2: 23 mmol/L (ref 22–32)
Calcium: 7.8 mg/dL — ABNORMAL LOW (ref 8.9–10.3)
Chloride: 110 mmol/L (ref 101–111)
Creatinine, Ser: 0.95 mg/dL (ref 0.61–1.24)
GFR calc Af Amer: >60 mL/min (ref >60)
GLUCOSE: 111 mg/dL — AB (ref 65–99)
Potassium: 3.8 mmol/L (ref 3.5–5.1)
SODIUM: 138 mmol/L (ref 135–145)

## 2017-06-25 MED ORDER — TIZANIDINE HCL 2 MG PO TABS: 2.0000 mg | ORAL_TABLET | Freq: Three times a day (TID) | ORAL | 0 refills | Status: DC | PRN

## 2017-06-25 MED ORDER — TRAMADOL HCL 50 MG PO TABS: 50.0000 mg | ORAL_TABLET | Freq: Two times a day (BID) | ORAL | 0 refills | Status: DC | PRN

## 2017-06-25 MED ORDER — PANTOPRAZOLE SODIUM 40 MG PO TBEC: 40.0000 mg | DELAYED_RELEASE_TABLET | Freq: Two times a day (BID) | ORAL | 2 refills | Status: DC

## 2017-06-25 MED ORDER — FERROUS SULFATE 325 (65 FE) MG PO TABS: 325.0000 mg | ORAL_TABLET | Freq: Two times a day (BID) | ORAL | 3 refills | Status: DC

## 2017-06-25 NOTE — Discharge Instructions (Signed)
1. No exertional activity for the next week 2. Make sure your doctor checks CBC in 1 week for your hemoglobin level 3. Watch for increased dark stools or bloody stools- call your doctor or come to ER if so 4. Sometimes iron can make your stools darken- monitor 5. Avoid aleeve, motrin, naproxen, advil etc.  Tylenol is ok to take

## 2017-06-25 NOTE — Progress Notes (Signed)
Patient discharged with wife, IV removed, catheter intact. Patient and wife verbalized understanding of education. Patient with no complaints.

## 2017-06-25 NOTE — Discharge Summary (Signed)
[facility] NAME: Michael Stevens    MR#:  [id number]  DATE OF BIRTH:  1941-03-06  DATE OF ADMISSION:  06/21/2017   ADMITTING PHYSICIAN: Henreitta Leber, MD  DATE OF DISCHARGE: 06/25/17  PRIMARY CARE PHYSICIAN: Arnetha Courser, MD   ADMISSION DIAGNOSIS:   Upper GI bleed [K92.2] Symptomatic anemia [D64.9]  DISCHARGE DIAGNOSIS:   Active Problems:   GI bleed   SECONDARY DIAGNOSIS:   Past Medical History:  Diagnosis Date  . Allergic rhinitis   . Anemia   . Arthritis    Back  . Bladder neck obstruction   . GERD (gastroesophageal reflux disease)    ocassional  . Glaucoma   . Hypertension   . Impotence     HOSPITAL COURSE:   77 year old male with past medical history significant for hypertension, arthritis, GERD presents to hospital secondary to weakness and shortness of breath and noted to have a hemoglobin of 5.2.  1. Acute blood loss anemia secondary to GI bleed- baseline hemoglobin was 12.8 about 2 weeks ago. -Complaining of melena. Appreciate GI consult -Colonoscopy in the past was normal. -EGD done this admission showing a two duodenal ulcers that were bleeding and hemostasis achieved by injecting epinephrine. -Currently on a regular diet. -Received 5 units transfusion this admission and hemoglobin at 7 at discharge -No active bleeding at this time.  -received IV Protonix-changed to oral bid at discharge. -advised to take iron supplements at discharge. -Repeat CBC in 1 week  2. Leukocytosis- stress reaction. Normal differential. -  -urinalysis negative for any infection  3. Hyperlipidemia-continue statin  4. Chronic back pain- avoid advil, other NSAIDS - tramadol prn prescription given  Updated wife at bedside. Patient will be discharged today.     DISCHARGE CONDITIONS:   Guarded  CONSULTS OBTAINED:    GI consult by Dr. Alice Reichert  DRUG ALLERGIES:   No Known Allergies DISCHARGE MEDICATIONS:    Allergies as of 06/25/2017   No Known Allergies     Medication List    STOP taking these medications   aspirin EC 81 MG tablet     TAKE these medications   atorvastatin 20 MG tablet Commonly known as:  LIPITOR Take 1 tablet (20 mg total) by mouth at bedtime.   ferrous sulfate 325 (65 FE) MG tablet Take 1 tablet (325 mg total) by mouth 2 (two) times daily with a meal.   latanoprost 0.005 % ophthalmic solution Commonly known as:  XALATAN Place 1 drop into both eyes at bedtime.   pantoprazole 40 MG tablet Commonly known as:  PROTONIX Take 1 tablet (40 mg total) by mouth 2 (two) times daily.   tiZANidine 2 MG tablet Commonly known as:  ZANAFLEX Take 1 tablet (2 mg total) by mouth every 8 (eight) hours as needed for muscle spasms. What changed:    when to take this  reasons to take this   traMADol 50 MG tablet Commonly known as:  ULTRAM Take 1 tablet (50 mg total) by mouth every 12 (twelve) hours as needed for moderate pain or severe pain.   VICKS VAPORUB EX Apply 1 application topically at bedtime. To clear nasal congestion        DISCHARGE INSTRUCTIONS:   1. PCP f/u in 1-2 weeks 2. GI f/u in 3-4 weeks  DIET:   Cardiac diet  ACTIVITY:   Activity as tolerated  OXYGEN:   Home Oxygen: No.  Oxygen Delivery: room air  DISCHARGE LOCATION:  home   If you experience worsening of your admission symptoms, develop shortness of breath, life threatening emergency, suicidal or homicidal thoughts you must seek medical attention immediately by calling 911 or calling your MD immediately  if symptoms less severe.  You Must read complete instructions/literature along with all the possible adverse reactions/side effects for all the Medicines you take and that have been prescribed to you. Take any new Medicines after you have completely understood and accpet all the possible adverse reactions/side effects.   Please note  You were cared for by a hospitalist during  your hospital stay. If you have any questions about your discharge medications or the care you received while you were in the hospital after you are discharged, you can call the unit and asked to speak with the hospitalist on call if the hospitalist that took care of you is not available. Once you are discharged, your primary care physician will handle any further medical issues. Please note that NO REFILLS for any discharge medications will be authorized once you are discharged, as it is imperative that you return to your primary care physician (or establish a relationship with a primary care physician if you do not have one) for your aftercare needs so that they can reassess your need for medications and monitor your lab values.    On the day of Discharge:  VITAL SIGNS:   Blood pressure 115/63, pulse 79, temperature 99.1 F (37.3 C), temperature source Oral, resp. rate 16, height 5\' 5"  (1.651 m), weight 73.1 kg (161 lb 3.2 oz), SpO2 98 %.  PHYSICAL EXAMINATION:    GENERAL:  77 y.o.-year-old patient lying in the bed with no acute distress.  EYES: Pupils equal, round, reactive to light and accommodation. No scleral icterus. Extraocular muscles intact.  HEENT: Head atraumatic, normocephalic. Oropharynx and nasopharynx clear.  NECK:  Supple, no jugular venous distention. No thyroid enlargement, no tenderness.  LUNGS: Normal breath sounds bilaterally, no wheezing, rales,rhonchi or crepitation. No use of accessory muscles of respiration.  CARDIOVASCULAR: S1, S2 normal. No murmurs, rubs, or gallops.  ABDOMEN: Soft, nontender, nondistended. Bowel sounds present. No organomegaly or mass.  EXTREMITIES: left arm edema noted. No pedal edema, cyanosis, or clubbing.  NEUROLOGIC: Cranial nerves II through XII are intact. Muscle strength 5/5 in all extremities. Sensation intact. Gait not checked.  PSYCHIATRIC: The patient is alert and oriented x 3.  SKIN: No obvious rash, lesion, or ulcer    DATA  REVIEW:   CBC Recent Labs  Lab 06/25/17 0420  WBC 14.0*  HGB 7.1*  HCT 21.6*  PLT 119*    Chemistries  Recent Labs  Lab 06/21/17 1326  06/25/17 0420  NA 135   < > 138  K 4.0   < > 3.8  CL 104   < > 110  CO2 19*   < > 23  GLUCOSE 175*   < > 111*  BUN 34*   < > 16  CREATININE 1.16   < > 0.95  CALCIUM 8.7*   < > 7.8*  AST 31  --   --   ALT 19  --   --   ALKPHOS 32*  --   --   BILITOT 0.7  --   --    < > = values in this interval not displayed.     Microbiology Results  Results for orders placed or performed during the hospital encounter of 04/28/11  Surgical pcr screen     Status: None  Collection Time: 04/28/11 11:10 AM  Result Value Ref Range Status   MRSA, PCR NEGATIVE NEGATIVE Final   Staphylococcus aureus NEGATIVE NEGATIVE Final    Comment:        The Xpert SA Assay (FDA approved for NASAL specimens only), is one component of a comprehensive surveillance program.  It is not intended to diagnose infection nor to guide or monitor treatment.    RADIOLOGY:  No results found.   Management plans discussed with the patient, family and they are in agreement.  CODE STATUS:     Code Status Orders  (From admission, onward)        Start     Ordered   06/21/17 1856  Full code  Continuous     06/21/17 1855    Code Status History    Date Active Date Inactive Code Status Order ID Comments User Context   This patient has a current code status but no historical code status.    Advance Directive Documentation     Most Recent Value  Type of Advance Directive  Living will  Pre-existing out of facility DNR order (yellow form or pink MOST form)  No data  "MOST" Form in Place?  No data      TOTAL TIME TAKING CARE OF THIS PATIENT: 38 minutes.    Gladstone Lighter M.D on 06/25/2017 at 11:24 AM  Between 7am to 6pm - Pager - [phone]  After 6pm go to [email or web address] - Proofreader  [facility]  Office   [phone]  CC: Primary care physician; Arnetha Courser, MD   Note: This dictation was prepared with Dragon dictation along with smaller phrase technology. Any transcriptional errors that result from this process are unintentional.

## 2017-06-26 ENCOUNTER — Telehealth: Payer: Self-pay | Admitting: Family Medicine

## 2017-06-26 NOTE — Telephone Encounter (Signed)
Transition Care Management Follow-up phone call Date discharged:  June 25, 2017 How have you been since you were released from the hospital?  Back issues; stool still discolored, going to take a while Any concerns?  Yes  Items reviewed Medicines:  Yes, pantoprazole Allergies:  Yes Dietary changes: Yes; high iron foods Referrals:  Yes; GI after discharge; wife will make the appointment  Functional questionnaire I = independent D = dependent  ADLs Personal hygiene:  I Dressing:  I Eating:  I Maintaining continence:  I Transfers: I  IADLs Basic communication skills:  I, confusion sometimes, expected per the hospital staff with blood loss; no concern for stroke Transportation:  D, wife drove him home from hospital; be cautious before sending him out Meal preparation:  I, always been that way wife jokes Shopping:  I Housework: I, except laundry Managing medicines:  I, wife has it sitting out Managing personal finances:  I  Confirmed importance and date/time of follow-up visit(s):  Making appt after I finish talking to wife Provider appointment booked with PCP:  Transferred to front, appt Thurs or Fri  Confirmed with patient that if condition begins to worsen, call PCP or go to the ER. Patient was given the office number [phone]) and encouraged to call back with questions or concerns:  yes

## 2017-06-30 ENCOUNTER — Ambulatory Visit: Payer: Medicare Other | Admitting: Family Medicine

## 2017-06-30 ENCOUNTER — Encounter: Payer: Self-pay | Admitting: Family Medicine

## 2017-06-30 VITALS — BP 140/70 | HR 97 | Temp 98.3°F | Resp 18 | Ht 65.0 in | Wt 158.1 lb

## 2017-06-30 DIAGNOSIS — D62 Acute posthemorrhagic anemia: Secondary | ICD-10-CM

## 2017-06-30 DIAGNOSIS — K269 Duodenal ulcer, unspecified as acute or chronic, without hemorrhage or perforation: Secondary | ICD-10-CM

## 2017-06-30 DIAGNOSIS — Z09 Encounter for follow-up examination after completed treatment for conditions other than malignant neoplasm: Secondary | ICD-10-CM | POA: Diagnosis not present

## 2017-06-30 DIAGNOSIS — R799 Abnormal finding of blood chemistry, unspecified: Secondary | ICD-10-CM

## 2017-06-30 DIAGNOSIS — M5441 Lumbago with sciatica, right side: Secondary | ICD-10-CM

## 2017-06-30 NOTE — Patient Instructions (Addendum)
Tizanidine (Muscle Relaxer) and Tramadol (Pain Medication) - May take these as needed for your right low back pain.

## 2017-06-30 NOTE — Progress Notes (Signed)
Name: Michael Stevens   MRN: [id number]    DOB: 1940/12/02   Date:06/30/2017       Progress Note  Subjective  Chief Complaint  Chief Complaint  Patient presents with  . Follow-up    test results    HPI  Pt presents for hospital follow up and concern for back pain:  Back Pain: He has chronic back pain, but has had a recent flare of RIGHT sided sciatic nerve pain. He is having trouble walking due to the pain however he denies extremity weakness, numbness/tingling.  He does have an appointment with Dr. Sharlet Salina' office who manages his back pain on Tuesday 07/04/2017.  Was prescribed Tizanidine and Tramadol upon hospital discharge, but has not been taking for his pain.  We will also provide stretches and discussed heat therapy.  Acute blood loss secondary to GI Bleed: HGB went from 12.8 06/13/17 to 5.2 on 06/21/17. Received 5 units of blood, Hgb 7.1 on Discharge 06/25/17.  Had elevated BUN, low Ca - will recheck today. - EGD found 2 duodenol ulcers - hemostasis was established after directly injecting epinephrine - Denies blood in stool, black and tarry stools, nausea/vomiting, abdominal pain, lightheadedness, dizziness, chest pain, shortness of breath since discharge.  His energy has been slowly returning, but he notes his recent back pain exacerbation has limited his activity. - Taking protonix 40mg  BID. - Has been avoiding NSAIDS and ETOH since discharge. - Next follow up with Dr. Alice Reichert (GI) with [facility] in 1 month.  Patient Active Problem List   Diagnosis Date Noted  . GI bleed 06/21/2017  . Abnormal EKG 01/31/2017  . Right bundle branch block 01/31/2017  . 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 Hyperglycemia 12/22/2015  . DDD (degenerative disc disease), lumbar 07/14/2015  . Anemia 01/14/2015  . Allergic rhinitis 10/13/2014  . 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 DD (diverticular disease) 10/13/2014  . Gastric catarrh 10/13/2014   . GERD (gastroesophageal reflux disease) 10/13/2014  . Glaucoma 10/13/2014  . Hypercholesteremia 10/13/2014  . Essential hypertension 10/13/2014    Past Surgical History:  Procedure Laterality Date  . BACK SURGERY  April 2011  . ESOPHAGOGASTRODUODENOSCOPY N/A 06/22/2017   Procedure: ESOPHAGOGASTRODUODENOSCOPY (EGD);  Surgeon: Toledo, Benay Pike, MD;  Location: [facility] ENDOSCOPY;  Service: Gastroenterology;  Laterality: N/A;    Family History  Problem Relation Age of Onset  . Healthy Mother   . Healthy Brother     Social History   Socioeconomic History  . Marital status: Married    Spouse name: [name]  . Number of children: 2  . Years of education: Not on file  . Highest education level: Not on file  Social Needs  . Financial resource strain: Not hard at all  . Food insecurity - worry: Never true  . Food insecurity - inability: Never true  . Transportation needs - medical: No  . Transportation needs - non-medical: No  Occupational History  . Occupation: Retired  Tobacco Use  . Smoking status: Former Smoker    Packs/day: 2.00    Years: 30.00    Pack years: 60.00    Types: Cigarettes    Last attempt to quit: 12/01/1982    Years since quitting: 34.6  . Smokeless tobacco: Never Used  . Tobacco comment: Quit 34 years ago  Substance and Sexual Activity  . Alcohol use: Yes    Alcohol/week: 1.2 - 1.8 oz    Types: 2 - 3 Cans of  beer per week    Comment: occasional  . Drug use: No  . Sexual activity: No  Other Topics Concern  . Not on file  Social History Narrative  . Not on file     Current Outpatient Medications:  .  atorvastatin (LIPITOR) 20 MG tablet, Take 1 tablet (20 mg total) by mouth at bedtime., Disp: 90 tablet, Rfl: 0 .  Camphor-Eucalyptus-Menthol (VICKS VAPORUB EX), Apply 1 application topically at bedtime. To clear nasal congestion, Disp: , Rfl:  .  ferrous sulfate 325 (65 FE) MG tablet, Take 1 tablet (325 mg total) by mouth 2 (two) times daily with a meal.,  Disp: 30 tablet, Rfl: 3 .  latanoprost (XALATAN) 0.005 % ophthalmic solution, Place 1 drop into both eyes at bedtime., Disp: 2.5 mL, Rfl:  .  pantoprazole (PROTONIX) 40 MG tablet, Take 1 tablet (40 mg total) by mouth 2 (two) times daily., Disp: 60 tablet, Rfl: 2 .  tiZANidine (ZANAFLEX) 2 MG tablet, Take 1 tablet (2 mg total) by mouth every 8 (eight) hours as needed for muscle spasms., Disp: 30 tablet, Rfl: 0 .  traMADol (ULTRAM) 50 MG tablet, Take 1 tablet (50 mg total) by mouth every 12 (twelve) hours as needed for moderate pain or severe pain., Disp: 20 tablet, Rfl: 0  No Known Allergies  ROS Constitutional: Negative for fever or weight change.  Respiratory: Negative for cough and shortness of breath.   Cardiovascular: Negative for chest pain or palpitations.  Gastrointestinal: Negative for abdominal pain, no bowel changes.  Musculoskeletal: Negative for gait problem or joint swelling. See HPI Skin: Negative for rash.  Neurological: Negative for dizziness or headache.  No other specific complaints in a complete review of systems (except as listed in HPI above).  Objective  Vitals:   06/30/17 1119  BP: 140/70  Pulse: 97  Resp: 18  Temp: 98.3 F (36.8 C)  TempSrc: Oral  SpO2: 96%  Weight: 158 lb 1.6 oz (71.7 kg)  Height: 5\' 5"  (1.651 m)    Body mass index is 26.31 kg/m.  Physical Exam Constitutional: Patient appears well-developed and well-nourished. No distress.  HENT: Head: Normocephalic and atraumatic. Ears: B TMs ok, no erythema or effusion; Nose: Nose normal. Mouth/Throat: Oropharynx is clear and moist. No oropharyngeal exudate.  Eyes: Conjunctivae and EOM are normal. Pupils are equal, round, and reactive to light. No scleral icterus.  Neck: Normal range of motion. Neck supple. No JVD present. No thyromegaly present.  Cardiovascular: Normal rate, regular rhythm and normal heart sounds.  No murmur heard. No BLE edema.  Cap refill <3 seconds Pulmonary/Chest: Effort  normal and breath sounds normal. No respiratory distress. Abdominal: Soft. Bowel sounds are normal, no distension. There is no tenderness. no masses Musculoskeletal: Normal range of motion, no joint effusions. No gross deformities. Mild to moderate tenderness along the low back - no crepitus, no bony tenderness. Neurological: he is alert and oriented to person, place, and time. No cranial nerve deficit. Coordination, balance, strength, speech and gait are normal.  Skin: Skin is warm and dry. No rash noted. No erythema.  Psychiatric: Patient has a normal mood and affect. behavior is normal. Judgment and thought content normal.  No results found for this or any previous visit (from the past 72 hour(s)).  PHQ2/9: Depression screen [facility] 2/9 06/13/2017 03/24/2017 03/14/2017 01/04/2017 10/04/2016  Decreased Interest 0 0 0 0 0  Down, Depressed, Hopeless 0 0 0 0 0  PHQ - 2 Score 0 0 0 0 0  Fall Risk: Fall Risk  06/13/2017 03/24/2017 03/14/2017 01/04/2017 10/04/2016  Falls in the past year? No No No No No  Risk for fall due to : - Other (Comment) - - -  Risk for fall due to: Comment - wears eye glasses - - -    Assessment & Plan  1. Anemia associated with acute blood loss - CBC - We will recheck to ensure stability. - Advised to continue to avoid NSAIDS and ETOH until able to discuss with Dr. Alice Reichert.  2. Duodenal ulcer Keep 44mo follow up with Dr. Alice Reichert. - Advised to continue to avoid NSAIDS and ETOH until able to discuss with Dr. Alice Reichert. - continue Protonix  3. Elevated BUN - BASIC METABOLIC PANEL WITH GFR  4. Right-sided low back pain with right-sided sciatica, unspecified chronicity - Advised to take Tizanidine and/or Tramadol as prescribed by hospitalist and keep follow up with pain management (Dr. Sharlet Salina' office) in 4 days.   - Sciatic nerve/low back pain exercises are provided in a separate handout, and pt is advised to only perform gentle stretches as tolerated. - Apply heat PRN.  5.  Hospital discharge follow-up Stable.

## 2017-07-03 LAB — CBC
HEMATOCRIT: 30.1 % — AB (ref 38.5–50.0)
HEMOGLOBIN: 9.9 g/dL — AB (ref 13.2–17.1)
MCH: 30.5 pg (ref 27.0–33.0)
MCHC: 32.9 g/dL (ref 32.0–36.0)
MCV: 92.6 fL (ref 80.0–100.0)
MPV: 10.2 fL (ref 7.5–12.5)
Platelets: 290 10*3/uL (ref 140–400)
RBC: 3.25 10*6/uL — ABNORMAL LOW (ref 4.20–5.80)
RDW: 15 % (ref 11.0–15.0)
WBC: 8 10*3/uL (ref 3.8–10.8)

## 2017-07-03 LAB — BASIC METABOLIC PANEL WITH GFR
BUN: 8 mg/dL (ref 7–25)
CO2: 25 mmol/L (ref 20–32)
CREATININE: 0.96 mg/dL (ref 0.70–1.18)
Calcium: 9 mg/dL (ref 8.6–10.3)
Chloride: 103 mmol/L (ref 98–110)
GFR, EST NON AFRICAN AMERICAN: 76 mL/min/{1.73_m2} (ref >=60)
GFR, Est African American: 89 mL/min/{1.73_m2} (ref >=60)
Glucose, Bld: 124 mg/dL (ref 65–139)
Potassium: 4 mmol/L (ref 3.5–5.3)
SODIUM: 137 mmol/L (ref 135–146)

## 2017-07-19 DIAGNOSIS — M5416 Radiculopathy, lumbar region: Secondary | ICD-10-CM | POA: Diagnosis not present

## 2017-07-19 DIAGNOSIS — M5136 Other intervertebral disc degeneration, lumbar region: Secondary | ICD-10-CM | POA: Diagnosis not present

## 2017-08-07 DIAGNOSIS — Z86018 Personal history of other benign neoplasm: Secondary | ICD-10-CM | POA: Diagnosis not present

## 2017-08-07 DIAGNOSIS — L570 Actinic keratosis: Secondary | ICD-10-CM | POA: Diagnosis not present

## 2017-08-07 DIAGNOSIS — Z872 Personal history of diseases of the skin and subcutaneous tissue: Secondary | ICD-10-CM | POA: Diagnosis not present

## 2017-08-07 DIAGNOSIS — L578 Other skin changes due to chronic exposure to nonionizing radiation: Secondary | ICD-10-CM | POA: Diagnosis not present

## 2017-08-09 DIAGNOSIS — M5136 Other intervertebral disc degeneration, lumbar region: Secondary | ICD-10-CM | POA: Diagnosis not present

## 2017-08-09 DIAGNOSIS — M5416 Radiculopathy, lumbar region: Secondary | ICD-10-CM | POA: Diagnosis not present

## 2017-08-17 DIAGNOSIS — H40053 Ocular hypertension, bilateral: Secondary | ICD-10-CM | POA: Diagnosis not present

## 2017-09-04 ENCOUNTER — Other Ambulatory Visit: Payer: Self-pay | Admitting: Family Medicine

## 2017-09-04 DIAGNOSIS — K269 Duodenal ulcer, unspecified as acute or chronic, without hemorrhage or perforation: Secondary | ICD-10-CM

## 2017-09-04 DIAGNOSIS — E7800 Pure hypercholesterolemia, unspecified: Secondary | ICD-10-CM

## 2017-09-04 DIAGNOSIS — D649 Anemia, unspecified: Secondary | ICD-10-CM

## 2017-09-04 DIAGNOSIS — K922 Gastrointestinal hemorrhage, unspecified: Secondary | ICD-10-CM

## 2017-09-04 NOTE — Telephone Encounter (Signed)
Patient is due for lipids He should not be out of statin for a few more days Please have him get labs asap and then we can decide if dose stays the same or needs to change Please also ADD a CBC since he had an ulcer and last H/H did not return to normal yet (dx: upper GI bleed, anemia secondary to blood loss)

## 2017-09-04 NOTE — Telephone Encounter (Signed)
Left detailed voicemial and labs ordered

## 2017-09-05 DIAGNOSIS — K264 Chronic or unspecified duodenal ulcer with hemorrhage: Secondary | ICD-10-CM | POA: Diagnosis not present

## 2017-09-05 DIAGNOSIS — D649 Anemia, unspecified: Secondary | ICD-10-CM | POA: Diagnosis not present

## 2017-09-05 DIAGNOSIS — K2930 Chronic superficial gastritis without bleeding: Secondary | ICD-10-CM | POA: Diagnosis not present

## 2017-09-06 DIAGNOSIS — M4306 Spondylolysis, lumbar region: Secondary | ICD-10-CM | POA: Diagnosis not present

## 2017-09-06 DIAGNOSIS — M62830 Muscle spasm of back: Secondary | ICD-10-CM | POA: Diagnosis not present

## 2017-09-06 DIAGNOSIS — M419 Scoliosis, unspecified: Secondary | ICD-10-CM | POA: Diagnosis not present

## 2017-09-06 DIAGNOSIS — M5136 Other intervertebral disc degeneration, lumbar region: Secondary | ICD-10-CM | POA: Diagnosis not present

## 2017-09-06 DIAGNOSIS — M5416 Radiculopathy, lumbar region: Secondary | ICD-10-CM | POA: Diagnosis not present

## 2017-09-18 DIAGNOSIS — Z8719 Personal history of other diseases of the digestive system: Secondary | ICD-10-CM | POA: Diagnosis not present

## 2017-10-02 ENCOUNTER — Encounter: Payer: Self-pay | Admitting: Family Medicine

## 2017-10-02 ENCOUNTER — Ambulatory Visit (INDEPENDENT_AMBULATORY_CARE_PROVIDER_SITE_OTHER): Payer: Medicare Other | Admitting: Family Medicine

## 2017-10-02 VITALS — BP 122/80 | HR 77 | Temp 98.3°F | Resp 14 | Ht 65.0 in | Wt 163.3 lb

## 2017-10-02 DIAGNOSIS — I10 Essential (primary) hypertension: Secondary | ICD-10-CM | POA: Diagnosis not present

## 2017-10-02 DIAGNOSIS — R739 Hyperglycemia, unspecified: Secondary | ICD-10-CM | POA: Diagnosis not present

## 2017-10-02 DIAGNOSIS — K219 Gastro-esophageal reflux disease without esophagitis: Secondary | ICD-10-CM | POA: Diagnosis not present

## 2017-10-02 DIAGNOSIS — Z5181 Encounter for therapeutic drug level monitoring: Secondary | ICD-10-CM

## 2017-10-02 DIAGNOSIS — D649 Anemia, unspecified: Secondary | ICD-10-CM

## 2017-10-02 DIAGNOSIS — E7800 Pure hypercholesterolemia, unspecified: Secondary | ICD-10-CM | POA: Diagnosis not present

## 2017-10-02 NOTE — Assessment & Plan Note (Signed)
On PPI per GI specialist; explained risks to PPI use long-term, including but not limited to pneumonia, C. diff colitis, anemia, osteoporosis, etc.; he should ask GI specialist how long tot ake the PPI BID

## 2017-10-02 NOTE — Assessment & Plan Note (Signed)
Encouraged less saturated fats; check lipids; continue statin

## 2017-10-02 NOTE — Progress Notes (Signed)
BP 122/80   Pulse 77   Temp 98.3 F (36.8 C) (Oral)   Resp 14   Ht 5\' 5"  (1.651 m)   Wt 163 lb 4.8 oz (74.1 kg)   SpO2 97%   BMI 27.17 kg/m    Subjective:    Patient ID: Michael Stevens, male    DOB: 12/23/1940, 77 y.o.   MRN: [id number]  HPI: Michael Stevens is a 77 y.o. male  Chief Complaint  Patient presents with  . Follow-up    HPI Patient is here for f/u His last visit was 06/30/17 when he had anemia secondary to blood loss; H/H was 9.9/30.1, up from discharge; at [facility], last CBC was 13.9/41.2 on June 3rd; Dr. Alice Reichert is the GI specialist and they are seeing his NP He is taking two iron pills and two stomach pills daily, one pill of each, iron and protonix BID Stools are dark but not nearly as dark as before  He has high cholesterol; not sure any inheritance; plenty of fruits; some veggies; seldom eats sausage or bacon; does eat a fair amount Lab Results  Component Value Date   CHOL 210 (H) 06/13/2017   CHOL 177 01/04/2017   CHOL 215 (H) 06/21/2016   Lab Results  Component Value Date   HDL 39 (L) 06/13/2017   HDL 46 01/04/2017   HDL 64 06/21/2016   Lab Results  Component Value Date   LDLCALC 151 (H) 06/13/2017   LDLCALC 111 (H) 01/04/2017   LDLCALC 133 (H) 06/21/2016   Lab Results  Component Value Date   TRIG 95 06/13/2017   TRIG 102 01/04/2017   TRIG 91 06/21/2016   Lab Results  Component Value Date   CHOLHDL 5.4 (H) 06/13/2017   CHOLHDL 3.8 01/04/2017   CHOLHDL 3.4 06/21/2016   No results found for: LDLDIRECT  Hyperglycemia; son has type 2 diabetes; not sure about grandparents; lived long lives; no dry mouth; not blurry vision No results found for: HGBA1C  Seeing Dr. Wallace Going, glaucoma  Depression screen [facility] 2/9 10/02/2017 06/13/2017 03/24/2017 03/14/2017 01/04/2017  Decreased Interest 0 0 0 0 0  Down, Depressed, Hopeless 0 0 0 0 0  PHQ - 2 Score 0 0 0 0 0    Relevant past medical, surgical, family and social history reviewed Past  Medical History:  Diagnosis Date  . Allergic rhinitis   . Anemia   . Arthritis    Back  . Bladder neck obstruction   . GERD (gastroesophageal reflux disease)    ocassional  . Glaucoma   . Hypertension   . Impotence    Past Surgical History:  Procedure Laterality Date  . BACK SURGERY  April 2011  . ESOPHAGOGASTRODUODENOSCOPY N/A 06/22/2017   Procedure: ESOPHAGOGASTRODUODENOSCOPY (EGD);  Surgeon: Toledo, Benay Pike, MD;  Location: [facility] ENDOSCOPY;  Service: Gastroenterology;  Laterality: N/A;   Family History  Problem Relation Age of Onset  . Healthy Mother   . Healthy Brother    Social History   Tobacco Use  . Smoking status: Former Smoker    Packs/day: 2.00    Years: 30.00    Pack years: 60.00    Types: Cigarettes    Last attempt to quit: 12/01/1982    Years since quitting: 34.8  . Smokeless tobacco: Never Used  . Tobacco comment: Quit 34 years ago  Substance Use Topics  . Alcohol use: Yes    Alcohol/week: 4.2 oz    Types: 7 Glasses of wine per week  Comment: occasional  . Drug use: No    Interim medical history since last visit reviewed. Allergies and medications reviewed  Review of Systems Per HPI unless specifically indicated above     Objective:    BP 122/80   Pulse 77   Temp 98.3 F (36.8 C) (Oral)   Resp 14   Ht 5\' 5"  (1.651 m)   Wt 163 lb 4.8 oz (74.1 kg)   SpO2 97%   BMI 27.17 kg/m   Wt Readings from Last 3 Encounters:  10/02/17 163 lb 4.8 oz (74.1 kg)  06/30/17 158 lb 1.6 oz (71.7 kg)  06/21/17 161 lb 3.2 oz (73.1 kg)    Physical Exam  Constitutional: He appears well-developed and well-nourished. No distress.  HENT:  Head: Normocephalic and atraumatic.  Eyes: EOM are normal. No scleral icterus.  Neck: No thyromegaly present.  Cardiovascular: Normal rate and regular rhythm.  Pulmonary/Chest: Effort normal and breath sounds normal.  Abdominal: Soft. Bowel sounds are normal. He exhibits no distension. There is no tenderness.    Musculoskeletal: He exhibits no edema.  Neurological: Coordination normal.  Skin: Skin is warm and dry. No pallor.  Psychiatric: He has a normal mood and affect. His behavior is normal. Judgment and thought content normal.    Results for orders placed or performed in visit on 06/30/17  CBC  Result Value Ref Range   WBC 8.0 3.8 - 10.8 Thousand/uL   RBC 3.25 (L) 4.20 - 5.80 Million/uL   Hemoglobin 9.9 (L) 13.2 - 17.1 g/dL   HCT 30.1 (L) 38.5 - 50.0 %   MCV 92.6 80.0 - 100.0 fL   MCH 30.5 27.0 - 33.0 pg   MCHC 32.9 32.0 - 36.0 g/dL   RDW 15.0 11.0 - 15.0 %   Platelets 290 140 - 400 Thousand/uL   MPV 10.2 7.5 - 12.5 fL  BASIC METABOLIC PANEL WITH GFR  Result Value Ref Range   Glucose, Bld 124 65 - 139 mg/dL   BUN 8 7 - 25 mg/dL   Creat 0.96 0.70 - 1.18 mg/dL   GFR, Est Non African American 76 > OR = 60 mL/min/1.60m2   GFR, Est African American 89 > OR = 60 mL/min/1.10m2   BUN/Creatinine Ratio NOT APPLICABLE 6 - 22 (calc)   Sodium 137 135 - 146 mmol/L   Potassium 4.0 3.5 - 5.3 mmol/L   Chloride 103 98 - 110 mmol/L   CO2 25 20 - 32 mmol/L   Calcium 9.0 8.6 - 10.3 mg/dL      Assessment & Plan:   Problem List Items Addressed This Visit      Cardiovascular and Mediastinum   Essential hypertension - Primary (Chronic)    Controlled today; DASH guidelines encouraged        Digestive   GERD (gastroesophageal reflux disease)    On PPI per GI specialist; explained risks to PPI use long-term, including but not limited to pneumonia, C. diff colitis, anemia, osteoporosis, etc.; he should ask GI specialist how long tot ake the PPI BID        Other   Hyperglycemia    Check A1c      Relevant Orders   Hemoglobin A1c   Hypercholesteremia (Chronic)    Encouraged less saturated fats; check lipids; continue statin      Relevant Orders   Lipid panel   Anemia    Improving; followed by specialist at [facility]; deferring to GI as to how long patient needs to be on  iron  supplementation and also how long to take PPI BID; he will contact GI for those answers       Other Visit Diagnoses    Medication monitoring encounter       Relevant Orders   COMPLETE METABOLIC PANEL WITH GFR       Follow up plan: Return in about 3 months (around 01/02/2018) for follow-up visit with Dr. Sanda Klein.  An after-visit summary was printed and given to the patient at [place].  Please see the patient instructions which may contain other information and recommendations beyond what is mentioned above in the assessment and plan.  No orders of the defined types were placed in this encounter.   Orders Placed This Encounter  Procedures  . Hemoglobin A1c  . Lipid panel  . COMPLETE METABOLIC PANEL WITH GFR

## 2017-10-02 NOTE — Patient Instructions (Addendum)
Contact the GI doctor to see if they still want you on iron and how long they want you to take the protonix twice a day My understanding is that you'll absorb the iron better if not with a full meal, and if taken with a little vitamin C  Try to limit saturated fats in your diet (bologna, hot dogs, barbeque, cheeseburgers, hamburgers, steak, bacon, sausage, cheese, etc.) and get more fresh fruits, vegetables, and whole grains  Try to follow the DASH guidelines (DASH stands for Dietary Approaches to Stop Hypertension). Try to limit the sodium in your diet to no more than 1,500mg  of sodium per day. Certainly try to not exceed 2,000 mg per day at the very most. Do not add salt when cooking or at the table.  Check the sodium amount on labels when shopping, and choose items lower in sodium when given a choice. Avoid or limit foods that already contain a lot of sodium. Eat a diet rich in fruits and vegetables and whole grains, and try to lose weight if overweight or obese   DASH Eating Plan DASH stands for "Dietary Approaches to Stop Hypertension." The DASH eating plan is a healthy eating plan that has been shown to reduce high blood pressure (hypertension). It may also reduce your risk for type 2 diabetes, heart disease, and stroke. The DASH eating plan may also help with weight loss. What are tips for following this plan? General guidelines  Avoid eating more than 2,300 mg (milligrams) of salt (sodium) a day. If you have hypertension, you may need to reduce your sodium intake to 1,500 mg a day.  Limit alcohol intake to no more than 1 drink a day for nonpregnant women and 2 drinks a day for men. One drink equals 12 oz of beer, 5 oz of wine, or 1 oz of hard liquor.  Work with your health care provider to maintain a healthy body weight or to lose weight. Ask what an ideal weight is for you.  Get at least 30 minutes of exercise that causes your heart to beat faster (aerobic exercise) most days of the  week. Activities may include walking, swimming, or biking.  Work with your health care provider or diet and nutrition specialist (dietitian) to adjust your eating plan to your individual calorie needs. Reading food labels  Check food labels for the amount of sodium per serving. Choose foods with less than 5 percent of the Daily Value of sodium. Generally, foods with less than 300 mg of sodium per serving fit into this eating plan.  To find whole grains, look for the word "whole" as the first word in the ingredient list. Shopping  Buy products labeled as "low-sodium" or "no salt added."  Buy fresh foods. Avoid canned foods and premade or frozen meals. Cooking  Avoid adding salt when cooking. Use salt-free seasonings or herbs instead of table salt or sea salt. Check with your health care provider or pharmacist before using salt substitutes.  Do not fry foods. Cook foods using healthy methods such as baking, boiling, grilling, and broiling instead.  Cook with heart-healthy oils, such as olive, canola, soybean, or sunflower oil. Meal planning   Eat a balanced diet that includes: ? 5 or more servings of fruits and vegetables each day. At each meal, try to fill half of your plate with fruits and vegetables. ? Up to 6-8 servings of whole grains each day. ? Less than 6 oz of lean meat, poultry, or fish each day. A  3-oz serving of meat is about the same size as a deck of cards. One egg equals 1 oz. ? 2 servings of low-fat dairy each day. ? A serving of nuts, seeds, or beans 5 times each week. ? Heart-healthy fats. Healthy fats called Omega-3 fatty acids are found in foods such as flaxseeds and coldwater fish, like sardines, salmon, and mackerel.  Limit how much you eat of the following: ? Canned or prepackaged foods. ? Food that is high in trans fat, such as fried foods. ? Food that is high in saturated fat, such as fatty meat. ? Sweets, desserts, sugary drinks, and other foods with added  sugar. ? Full-fat dairy products.  Do not salt foods before eating.  Try to eat at least 2 vegetarian meals each week.  Eat more home-cooked food and less restaurant, buffet, and fast food.  When eating at a restaurant, ask that your food be prepared with less salt or no salt, if possible. What foods are recommended? The items listed may not be a complete list. Talk with your dietitian about what dietary choices are best for you. Grains Whole-grain or whole-wheat bread. Whole-grain or whole-wheat pasta. Brown rice. [place]. Bulgur. Whole-grain and low-sodium cereals. Pita bread. Low-fat, low-sodium crackers. Whole-wheat flour tortillas. Vegetables Fresh or frozen vegetables (raw, steamed, roasted, or grilled). Low-sodium or reduced-sodium tomato and vegetable juice. Low-sodium or reduced-sodium tomato sauce and tomato paste. Low-sodium or reduced-sodium canned vegetables. Fruits All fresh, dried, or frozen fruit. Canned fruit in natural juice (without added sugar). Meat and other protein foods Skinless chicken or [place]. Ground chicken or [place]. Pork with fat trimmed off. Fish and seafood. Egg whites. Dried beans, peas, or lentils. Unsalted nuts, nut butters, and seeds. Unsalted canned beans. Lean cuts of beef with fat trimmed off. Low-sodium, lean deli meat. Dairy Low-fat (1%) or fat-free (skim) milk. Fat-free, low-fat, or reduced-fat cheeses. Nonfat, low-sodium ricotta or cottage cheese. Low-fat or nonfat yogurt. Low-fat, low-sodium cheese. Fats and oils Soft margarine without trans fats. Vegetable oil. Low-fat, reduced-fat, or light mayonnaise and salad dressings (reduced-sodium). Canola, safflower, olive, soybean, and sunflower oils. Avocado. Seasoning and other foods Herbs. Spices. Seasoning mixes without salt. Unsalted popcorn and pretzels. Fat-free sweets. What foods are not recommended? The items listed may not be a complete list. Talk with your dietitian about what  dietary choices are best for you. Grains Baked goods made with fat, such as croissants, muffins, or some breads. Dry pasta or rice meal packs. Vegetables Creamed or fried vegetables. Vegetables in a cheese sauce. Regular canned vegetables (not low-sodium or reduced-sodium). Regular canned tomato sauce and paste (not low-sodium or reduced-sodium). Regular tomato and vegetable juice (not low-sodium or reduced-sodium). Angie Fava. Olives. Fruits Canned fruit in a light or heavy syrup. Fried fruit. Fruit in cream or butter sauce. Meat and other protein foods Fatty cuts of meat. Ribs. Fried meat. Berniece Salines. Sausage. Bologna and other processed lunch meats. Salami. Fatback. Hotdogs. Bratwurst. Salted nuts and seeds. Canned beans with added salt. Canned or smoked fish. Whole eggs or egg yolks. Chicken or [place] with skin. Dairy Whole or 2% milk, cream, and half-and-half. Whole or full-fat cream cheese. Whole-fat or sweetened yogurt. Full-fat cheese. Nondairy creamers. Whipped toppings. Processed cheese and cheese spreads. Fats and oils Butter. Stick margarine. Lard. Shortening. Ghee. Bacon fat. Tropical oils, such as coconut, palm kernel, or palm oil. Seasoning and other foods Salted popcorn and pretzels. Onion salt, garlic salt, seasoned salt, table salt, and sea salt. Worcestershire sauce. Tartar sauce.  Barbecue sauce. Teriyaki sauce. Soy sauce, including reduced-sodium. Steak sauce. Canned and packaged gravies. Fish sauce. Oyster sauce. Cocktail sauce. Horseradish that you find on the shelf. Ketchup. Mustard. Meat flavorings and tenderizers. Bouillon cubes. Hot sauce and Tabasco sauce. Premade or packaged marinades. Premade or packaged taco seasonings. Relishes. Regular salad dressings. Where to find more information:  National Heart, Lung, and [place]: https://wilson-eaton.com/  American Heart Association: www.heart.org Summary  The DASH eating plan is a healthy eating plan that has been shown to reduce  high blood pressure (hypertension). It may also reduce your risk for type 2 diabetes, heart disease, and stroke.  With the DASH eating plan, you should limit salt (sodium) intake to 2,300 mg a day. If you have hypertension, you may need to reduce your sodium intake to 1,500 mg a day.  When on the DASH eating plan, aim to eat more fresh fruits and vegetables, whole grains, lean proteins, low-fat dairy, and heart-healthy fats.  Work with your health care provider or diet and nutrition specialist (dietitian) to adjust your eating plan to your individual calorie needs. This information is not intended to replace advice given to you by your health care provider. Make sure you discuss any questions you have with your health care provider. Document Released: 03/24/2011 Document Revised: 03/28/2016 Document Reviewed: 03/28/2016 Elsevier Interactive Patient Education  2018 [employer].  Cholesterol Cholesterol is a white, waxy, fat-like substance that is needed by the human body in small amounts. The liver makes all the cholesterol we need. Cholesterol is carried from the liver by the blood through the blood vessels. Deposits of cholesterol (plaques) may build up on blood vessel (artery) walls. Plaques make the arteries narrower and stiffer. Cholesterol plaques increase the risk for heart attack and stroke. You cannot feel your cholesterol level even if it is very high. The only way to know that it is high is to have a blood test. Once you know your cholesterol levels, you should keep a record of the test results. Work with your health care provider to keep your levels in the desired range. What do the results mean?  Total cholesterol is a rough measure of all the cholesterol in your blood.  LDL (low-density lipoprotein) is the "bad" cholesterol. This is the type that causes plaque to build up on the artery walls. You want this level to be low.  HDL (high-density lipoprotein) is the "good" cholesterol  because it cleans the arteries and carries the LDL away. You want this level to be high.  Triglycerides are fat that the body can either burn for energy or store. High levels are closely linked to heart disease. What are the desired levels of cholesterol?  Total cholesterol below 200.  LDL below 100 for people who are at risk, below 70 for people at very high risk.  HDL above 40 is good. A level of 60 or higher is considered to be protective against heart disease.  Triglycerides below 150. How can I lower my cholesterol? Diet Follow your diet program as told by your health care provider.  Choose fish or white meat chicken and [place], roasted or baked. Limit fatty cuts of red meat, fried foods, and processed meats, such as sausage and lunch meats.  Eat lots of fresh fruits and vegetables.  Choose whole grains, beans, pasta, potatoes, and cereals.  Choose olive oil, corn oil, or canola oil, and use only small amounts.  Avoid butter, mayonnaise, shortening, or palm kernel oils.  Avoid foods with  trans fats.  Drink skim or nonfat milk and eat low-fat or nonfat yogurt and cheeses. Avoid whole milk, cream, ice cream, egg yolks, and full-fat cheeses.  Healthier desserts include angel food cake, ginger snaps, animal crackers, hard candy, popsicles, and low-fat or nonfat frozen yogurt. Avoid pastries, cakes, pies, and cookies.  Exercise  Follow your exercise program as told by your health care provider. A regular program: ? Helps to decrease LDL and raise HDL. ? Helps with weight control.  Do things that increase your activity level, such as gardening, walking, and taking the stairs.  Ask your health care provider about ways that you can be more active in your daily life.  Medicine  Take over-the-counter and prescription medicines only as told by your health care provider. ? Medicine may be prescribed by your health care provider to help lower cholesterol and decrease the risk for  heart disease. This is usually done if diet and exercise have failed to bring down cholesterol levels. ? If you have several risk factors, you may need medicine even if your levels are normal.  This information is not intended to replace advice given to you by your health care provider. Make sure you discuss any questions you have with your health care provider. Document Released: 12/28/2000 Document Revised: 10/31/2015 Document Reviewed: 10/03/2015 Elsevier Interactive Patient Education  [employer].

## 2017-10-02 NOTE — Assessment & Plan Note (Signed)
Check A1c. 

## 2017-10-02 NOTE — Assessment & Plan Note (Addendum)
Controlled today; DASH guidelines encouraged

## 2017-10-02 NOTE — Assessment & Plan Note (Signed)
Improving; followed by specialist at [facility]; deferring to GI as to how long patient needs to be on iron supplementation and also how long to take PPI BID; he will contact GI for those answers

## 2017-10-04 ENCOUNTER — Other Ambulatory Visit: Payer: Self-pay | Admitting: Family Medicine

## 2017-10-04 DIAGNOSIS — E7800 Pure hypercholesterolemia, unspecified: Secondary | ICD-10-CM

## 2017-10-04 MED ORDER — ATORVASTATIN CALCIUM 20 MG PO TABS: 20.0000 mg | ORAL_TABLET | Freq: Every day | ORAL | 3 refills | Status: DC

## 2017-10-04 NOTE — Progress Notes (Signed)
Refilled the statin

## 2017-10-06 DIAGNOSIS — M47816 Spondylosis without myelopathy or radiculopathy, lumbar region: Secondary | ICD-10-CM | POA: Diagnosis not present

## 2017-10-16 DIAGNOSIS — H6063 Unspecified chronic otitis externa, bilateral: Secondary | ICD-10-CM | POA: Diagnosis not present

## 2017-10-16 DIAGNOSIS — K1321 Leukoplakia of oral mucosa, including tongue: Secondary | ICD-10-CM | POA: Diagnosis not present

## 2017-10-16 DIAGNOSIS — H6123 Impacted cerumen, bilateral: Secondary | ICD-10-CM | POA: Diagnosis not present

## 2017-10-24 DIAGNOSIS — M19171 Post-traumatic osteoarthritis, right ankle and foot: Secondary | ICD-10-CM | POA: Diagnosis not present

## 2017-11-01 DIAGNOSIS — G8929 Other chronic pain: Secondary | ICD-10-CM | POA: Diagnosis not present

## 2017-11-01 DIAGNOSIS — G629 Polyneuropathy, unspecified: Secondary | ICD-10-CM | POA: Diagnosis not present

## 2017-11-01 DIAGNOSIS — R200 Anesthesia of skin: Secondary | ICD-10-CM | POA: Diagnosis not present

## 2017-11-01 DIAGNOSIS — M545 Low back pain: Secondary | ICD-10-CM | POA: Diagnosis not present

## 2017-11-01 DIAGNOSIS — M79604 Pain in right leg: Secondary | ICD-10-CM | POA: Diagnosis not present

## 2017-11-02 ENCOUNTER — Other Ambulatory Visit: Payer: Self-pay | Admitting: Neurology

## 2017-11-02 DIAGNOSIS — M545 Low back pain: Secondary | ICD-10-CM

## 2017-11-02 DIAGNOSIS — R200 Anesthesia of skin: Secondary | ICD-10-CM

## 2017-11-02 DIAGNOSIS — R202 Paresthesia of skin: Secondary | ICD-10-CM

## 2017-11-07 DIAGNOSIS — M79604 Pain in right leg: Secondary | ICD-10-CM | POA: Insufficient documentation

## 2017-11-07 DIAGNOSIS — M79605 Pain in left leg: Secondary | ICD-10-CM

## 2017-11-07 DIAGNOSIS — G629 Polyneuropathy, unspecified: Secondary | ICD-10-CM | POA: Insufficient documentation

## 2017-11-13 DIAGNOSIS — D3705 Neoplasm of uncertain behavior of pharynx: Secondary | ICD-10-CM | POA: Diagnosis not present

## 2017-11-13 DIAGNOSIS — D3701 Neoplasm of uncertain behavior of lip: Secondary | ICD-10-CM | POA: Diagnosis not present

## 2017-11-13 DIAGNOSIS — D3709 Neoplasm of uncertain behavior of other specified sites of the oral cavity: Secondary | ICD-10-CM | POA: Diagnosis not present

## 2017-11-15 ENCOUNTER — Ambulatory Visit
Admission: RE | Admit: 2017-11-15 | Discharge: 2017-11-15 | Disposition: A | Discharge status: Home / Self Care | Payer: Medicare Other | Source: Ambulatory Visit | Attending: Neurology | Admitting: Neurology

## 2017-11-15 ENCOUNTER — Ambulatory Visit: Payer: Medicare Other

## 2017-11-15 DIAGNOSIS — R202 Paresthesia of skin: Secondary | ICD-10-CM | POA: Diagnosis not present

## 2017-11-15 DIAGNOSIS — R200 Anesthesia of skin: Secondary | ICD-10-CM | POA: Diagnosis not present

## 2017-11-15 DIAGNOSIS — M545 Low back pain: Secondary | ICD-10-CM | POA: Diagnosis not present

## 2017-11-15 DIAGNOSIS — M5136 Other intervertebral disc degeneration, lumbar region: Secondary | ICD-10-CM | POA: Diagnosis not present

## 2017-11-15 DIAGNOSIS — M48061 Spinal stenosis, lumbar region without neurogenic claudication: Secondary | ICD-10-CM | POA: Insufficient documentation

## 2017-11-15 DIAGNOSIS — R600 Localized edema: Secondary | ICD-10-CM | POA: Diagnosis not present

## 2017-11-22 DIAGNOSIS — M79605 Pain in left leg: Secondary | ICD-10-CM | POA: Diagnosis not present

## 2017-11-22 DIAGNOSIS — M79604 Pain in right leg: Secondary | ICD-10-CM | POA: Diagnosis not present

## 2017-11-22 DIAGNOSIS — R251 Tremor, unspecified: Secondary | ICD-10-CM | POA: Diagnosis not present

## 2017-11-22 DIAGNOSIS — G629 Polyneuropathy, unspecified: Secondary | ICD-10-CM | POA: Diagnosis not present

## 2017-11-27 DIAGNOSIS — R251 Tremor, unspecified: Secondary | ICD-10-CM | POA: Insufficient documentation

## 2017-12-01 ENCOUNTER — Encounter: Payer: Self-pay | Admitting: Family Medicine

## 2017-12-01 DIAGNOSIS — K1321 Leukoplakia of oral mucosa, including tongue: Secondary | ICD-10-CM

## 2017-12-01 HISTORY — DX: Leukoplakia of oral mucosa, including tongue: K13.21

## 2017-12-06 DIAGNOSIS — K219 Gastro-esophageal reflux disease without esophagitis: Secondary | ICD-10-CM | POA: Diagnosis not present

## 2017-12-06 DIAGNOSIS — K264 Chronic or unspecified duodenal ulcer with hemorrhage: Secondary | ICD-10-CM | POA: Diagnosis not present

## 2017-12-06 DIAGNOSIS — D509 Iron deficiency anemia, unspecified: Secondary | ICD-10-CM | POA: Diagnosis not present

## 2017-12-08 DIAGNOSIS — M19042 Primary osteoarthritis, left hand: Secondary | ICD-10-CM | POA: Diagnosis not present

## 2017-12-08 DIAGNOSIS — M19041 Primary osteoarthritis, right hand: Secondary | ICD-10-CM | POA: Diagnosis not present

## 2017-12-11 ENCOUNTER — Ambulatory Visit: Payer: Medicare Other | Admitting: Family Medicine

## 2017-12-12 DIAGNOSIS — M25571 Pain in right ankle and joints of right foot: Secondary | ICD-10-CM | POA: Diagnosis not present

## 2017-12-13 DIAGNOSIS — M47816 Spondylosis without myelopathy or radiculopathy, lumbar region: Secondary | ICD-10-CM | POA: Diagnosis not present

## 2017-12-13 DIAGNOSIS — M5416 Radiculopathy, lumbar region: Secondary | ICD-10-CM | POA: Diagnosis not present

## 2017-12-13 DIAGNOSIS — M5136 Other intervertebral disc degeneration, lumbar region: Secondary | ICD-10-CM | POA: Diagnosis not present

## 2018-01-03 ENCOUNTER — Ambulatory Visit (INDEPENDENT_AMBULATORY_CARE_PROVIDER_SITE_OTHER): Payer: Medicare Other | Admitting: Family Medicine

## 2018-01-03 ENCOUNTER — Encounter: Payer: Self-pay | Admitting: Family Medicine

## 2018-01-03 VITALS — BP 126/78 | HR 80 | Temp 97.7°F | Ht 65.0 in | Wt 159.7 lb

## 2018-01-03 DIAGNOSIS — R739 Hyperglycemia, unspecified: Secondary | ICD-10-CM

## 2018-01-03 DIAGNOSIS — I10 Essential (primary) hypertension: Secondary | ICD-10-CM

## 2018-01-03 DIAGNOSIS — D649 Anemia, unspecified: Secondary | ICD-10-CM | POA: Diagnosis not present

## 2018-01-03 DIAGNOSIS — E7800 Pure hypercholesterolemia, unspecified: Secondary | ICD-10-CM | POA: Diagnosis not present

## 2018-01-03 DIAGNOSIS — Z23 Encounter for immunization: Secondary | ICD-10-CM

## 2018-01-03 DIAGNOSIS — D500 Iron deficiency anemia secondary to blood loss (chronic): Secondary | ICD-10-CM

## 2018-01-03 DIAGNOSIS — Z5181 Encounter for therapeutic drug level monitoring: Secondary | ICD-10-CM

## 2018-01-03 NOTE — Assessment & Plan Note (Signed)
Monitor lipids periodically; encouraged diet low in saturated fats; continue statin

## 2018-01-03 NOTE — Assessment & Plan Note (Signed)
Resolved on last labs at [employer], H/H 14.1/41.5

## 2018-01-03 NOTE — Progress Notes (Signed)
BP 126/78   Pulse 80   Temp 97.7 F (36.5 C)   Ht 5\' 5"  (1.651 m)   Wt 159 lb 11.2 oz (72.4 kg)   SpO2 99%   BMI 26.58 kg/m    Subjective:    Patient ID: Michael Stevens, male    DOB: May 01, 1940, 77 y.o.   MRN: [id number]  HPI: Michael Stevens is a 77 y.o. male  Chief Complaint  Patient presents with  . Follow-up    HPI Patient is here for f/u  High cholesterol; taking atorvastatin; plenty of fruits and veggies; does not eat much bacon or sausage; eats eggs, 2-3 a week he thinks, maybe 3-4 eggs   Lab Results  Component Value Date   CHOL 142 10/02/2017   HDL 48 10/02/2017   LDLCALC 77 10/02/2017   TRIG 83 10/02/2017   CHOLHDL 3.0 10/02/2017   High blood pressure Controlled today; not adding much salt to the food; cooks with very little salt  High glucose Lab Results  Component Value Date   HGBA1C 5.4 10/02/2017   Anemia; Hbg done on August 21st at [place], see CareEverywhere; Hgb 14.1, Hct 41.5 They have run out of the iron  Depression screen [facility] 2/9 01/03/2018 10/02/2017 06/13/2017 03/24/2017 03/14/2017  Decreased Interest 0 0 0 0 0  Down, Depressed, Hopeless 0 0 0 0 0  PHQ - 2 Score 0 0 0 0 0    Relevant past medical, surgical, family and social history reviewed Past Medical History:  Diagnosis Date  . Allergic rhinitis   . Anemia   . Arthritis    Back  . Bladder neck obstruction   . GERD (gastroesophageal reflux disease)    ocassional  . Glaucoma   . Hypertension   . Impotence   . 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 BACK SURGERY  April 2011  . ESOPHAGOGASTRODUODENOSCOPY N/A 06/22/2017   Procedure: ESOPHAGOGASTRODUODENOSCOPY (EGD);  Surgeon: Toledo, Benay Pike, MD;  Location: [facility] ENDOSCOPY;  Service: Gastroenterology;  Laterality: N/A;   Family History  Problem Relation Age of Onset  . Healthy Mother   . Healthy Brother    Social History   Tobacco  Use  . Smoking status: Former Smoker    Packs/day: 2.00    Years: 30.00    Pack years: 60.00    Types: Cigarettes    Last attempt to quit: 12/01/1982    Years since quitting: 35.1  . Smokeless tobacco: Never Used  . Tobacco comment: Quit 34 years ago  Substance Use Topics  . Alcohol use: Yes    Alcohol/week: 7.0 standard drinks    Types: 7 Glasses of wine per week    Comment: occasional  . Drug use: No    Interim medical history since last visit reviewed. Allergies and medications reviewed  Review of Systems Per HPI unless specifically indicated above     Objective:    BP 126/78   Pulse 80   Temp 97.7 F (36.5 C)   Ht 5\' 5"  (1.651 m)   Wt 159 lb 11.2 oz (72.4 kg)   SpO2 99%   BMI 26.58 kg/m   Wt Readings from Last 3 Encounters:  01/03/18 159 lb 11.2 oz (72.4 kg)  10/02/17 163 lb 4.8 oz (74.1 kg)  06/30/17 158 lb 1.6 oz (71.7 kg)    Physical Exam  Constitutional: He appears well-developed and well-nourished.  No distress.  HENT:  Head: Normocephalic and atraumatic.  Eyes: EOM are normal. No scleral icterus.  Neck: No thyromegaly present.  Cardiovascular: Normal rate and regular rhythm.  Pulmonary/Chest: Effort normal and breath sounds normal.  Abdominal: Soft. Bowel sounds are normal. He exhibits no distension.  Musculoskeletal: He exhibits no edema.  Neurological: Coordination normal.  Shuffling gait  Skin: Skin is warm and dry. No pallor.  Psychiatric: He has a normal mood and affect. His behavior is normal. Judgment and thought content normal.  Rather masked facies, but good historian    Results for orders placed or performed in visit on 10/02/17  Hemoglobin A1c  Result Value Ref Range   Hgb A1c MFr Bld 5.4 <5.7 % of total Hgb   Mean Plasma Glucose 108 (calc)   eAG (mmol/L) 6.0 (calc)  Lipid panel  Result Value Ref Range   Cholesterol 142 <200 mg/dL   HDL 48 >40 mg/dL   Triglycerides 83 <150 mg/dL   LDL Cholesterol (Calc) 77 mg/dL (calc)   Total  CHOL/HDL Ratio 3.0 <5.0 (calc)   Non-HDL Cholesterol (Calc) 94 <130 mg/dL (calc)  COMPLETE METABOLIC PANEL WITH GFR  Result Value Ref Range   Glucose, Bld 97 65 - 139 mg/dL   BUN 11 7 - 25 mg/dL   Creat 1.03 0.70 - 1.18 mg/dL   GFR, Est Non African American 70 > OR = 60 mL/min/1.35m2   GFR, Est African American 81 > OR = 60 mL/min/1.32m2   BUN/Creatinine Ratio NOT APPLICABLE 6 - 22 (calc)   Sodium 135 135 - 146 mmol/L   Potassium 4.1 3.5 - 5.3 mmol/L   Chloride 101 98 - 110 mmol/L   CO2 25 20 - 32 mmol/L   Calcium 9.5 8.6 - 10.3 mg/dL   Total Protein 6.5 6.1 - 8.1 g/dL   Albumin 4.4 3.6 - 5.1 g/dL   Globulin 2.1 1.9 - 3.7 g/dL (calc)   AG Ratio 2.1 1.0 - 2.5 (calc)   Total Bilirubin 0.7 0.2 - 1.2 mg/dL   Alkaline phosphatase (APISO) 51 40 - 115 U/L   AST 17 10 - 35 U/L   ALT 10 9 - 46 U/L      Assessment & Plan:   Problem List Items Addressed This Visit      Cardiovascular and Mediastinum   Essential hypertension - Primary (Chronic)    Controlled today        Other   Hyperglycemia    Last A1c normal; maintain health weight, diet low in simple carbs, sweets      Relevant Orders   Hemoglobin A1c   Hypercholesteremia (Chronic)    Monitor lipids periodically; encouraged diet low in saturated fats; continue statin      Relevant Orders   Lipid panel   Anemia    Resolved on last labs at [place], H/H 14.1/41.5      Relevant Orders   CBC with Differential/Platelet   CBC with Differential/Platelet    Other Visit Diagnoses    Medication monitoring encounter       Relevant Orders   COMPLETE METABOLIC PANEL WITH GFR   Need for influenza vaccination       Relevant Orders   Flu vaccine HIGH DOSE PF (Fluzone High dose) (Completed)       Follow up plan: Return in about 6 months (around 07/04/2018) for follow-up visit with Dr. Sanda Klein.  An after-visit summary was printed and given to the patient at [place].  Please see the patient instructions  which may contain other  information and recommendations beyond what is mentioned above in the assessment and plan.  No orders of the defined types were placed in this encounter.   Orders Placed This Encounter  Procedures  . Flu vaccine HIGH DOSE PF (Fluzone High dose)  . CBC with Differential/Platelet  . COMPLETE METABOLIC PANEL WITH GFR  . Hemoglobin A1c  . Lipid panel    Labs ordered for future

## 2018-01-03 NOTE — Patient Instructions (Addendum)
Try to use PLAIN allergy medicine without the decongestant Avoid: phenylephrine, phenylpropanolamine, and pseudoephredine  Try to follow the DASH guidelines (DASH stands for Dietary Approaches to Stop Hypertension). Try to limit the sodium in your diet to no more than 1,500mg  of sodium per day. Certainly try to not exceed 2,000 mg per day at the very most. Do not add salt when cooking or at the table.  Check the sodium amount on labels when shopping, and choose items lower in sodium when given a choice. Avoid or limit foods that already contain a lot of sodium. Eat a diet rich in fruits and vegetables and whole grains, and try to lose weight if overweight or obese  Try to limit saturated fats in your diet (bologna, hot dogs, barbeque, cheeseburgers, hamburgers, steak, bacon, sausage, cheese, etc.) and get more fresh fruits, vegetables, and whole grains  Return in 3 months for fasting labs

## 2018-01-03 NOTE — Assessment & Plan Note (Signed)
Controlled today 

## 2018-01-03 NOTE — Assessment & Plan Note (Signed)
Last A1c normal; maintain health weight, diet low in simple carbs, sweets

## 2018-01-08 DIAGNOSIS — M79605 Pain in left leg: Secondary | ICD-10-CM | POA: Diagnosis not present

## 2018-01-08 DIAGNOSIS — G629 Polyneuropathy, unspecified: Secondary | ICD-10-CM | POA: Diagnosis not present

## 2018-01-08 DIAGNOSIS — R251 Tremor, unspecified: Secondary | ICD-10-CM | POA: Diagnosis not present

## 2018-01-08 DIAGNOSIS — M79604 Pain in right leg: Secondary | ICD-10-CM | POA: Diagnosis not present

## 2018-01-15 ENCOUNTER — Other Ambulatory Visit (HOSPITAL_COMMUNITY): Payer: Self-pay | Admitting: Neurology

## 2018-01-15 DIAGNOSIS — R251 Tremor, unspecified: Secondary | ICD-10-CM

## 2018-01-16 LAB — COMPLETE METABOLIC PANEL WITH GFR
AG Ratio: 2.1 (calc) (ref 1.0–2.5)
ALT: 10 U/L (ref 9–46)
AST: 17 U/L (ref 10–35)
Albumin: 4.4 g/dL (ref 3.6–5.1)
Alkaline phosphatase (APISO): 51 U/L (ref 40–115)
BUN: 11 mg/dL (ref 7–25)
CALCIUM: 9.5 mg/dL (ref 8.6–10.3)
CO2: 25 mmol/L (ref 20–32)
CREATININE: 1.03 mg/dL (ref 0.70–1.18)
Chloride: 101 mmol/L (ref 98–110)
GFR, EST NON AFRICAN AMERICAN: 70 mL/min/{1.73_m2} (ref >=60)
GFR, Est African American: 81 mL/min/{1.73_m2} (ref >=60)
Globulin: 2.1 g/dL (calc) (ref 1.9–3.7)
Glucose, Bld: 97 mg/dL (ref 65–139)
POTASSIUM: 4.1 mmol/L (ref 3.5–5.3)
Sodium: 135 mmol/L (ref 135–146)
TOTAL PROTEIN: 6.5 g/dL (ref 6.1–8.1)
Total Bilirubin: 0.7 mg/dL (ref 0.2–1.2)

## 2018-01-16 LAB — HEMOGLOBIN A1C
Hgb A1c MFr Bld: 5.4 % of total Hgb (ref <5.7)
Mean Plasma Glucose: 108 (calc)
eAG (mmol/L): 6 (calc)

## 2018-01-16 LAB — LIPID PANEL
CHOL/HDL RATIO: 3 (calc) (ref <5.0)
Cholesterol: 142 mg/dL (ref <200)
HDL: 48 mg/dL (ref >40)
LDL Cholesterol (Calc): 77 mg/dL (calc)
NON-HDL CHOLESTEROL (CALC): 94 mg/dL (ref <130)
Triglycerides: 83 mg/dL (ref <150)

## 2018-01-17 DIAGNOSIS — M47816 Spondylosis without myelopathy or radiculopathy, lumbar region: Secondary | ICD-10-CM | POA: Diagnosis not present

## 2018-02-01 ENCOUNTER — Encounter (HOSPITAL_COMMUNITY)
Admission: RE | Admit: 2018-02-01 | Discharge: 2018-02-01 | Disposition: A | Discharge status: Home / Self Care | Payer: Medicare Other | Source: Ambulatory Visit | Attending: Neurology | Admitting: Neurology

## 2018-02-01 ENCOUNTER — Ambulatory Visit (HOSPITAL_COMMUNITY)
Admission: RE | Admit: 2018-02-01 | Discharge: 2018-02-01 | Disposition: A | Discharge status: Home / Self Care | Payer: Medicare Other | Source: Ambulatory Visit | Attending: Neurology | Admitting: Neurology

## 2018-02-01 DIAGNOSIS — R251 Tremor, unspecified: Secondary | ICD-10-CM

## 2018-02-01 DIAGNOSIS — R410 Disorientation, unspecified: Secondary | ICD-10-CM | POA: Diagnosis not present

## 2018-02-01 MED ORDER — IOFLUPANE I 123 185 MBQ/2.5ML IV SOLN
4.5000 | Freq: Once | INTRAVENOUS | Status: AC
  Administered 2018-02-01: 4.5 via INTRAVENOUS

## 2018-02-01 MED ORDER — IODINE STRONG (LUGOLS) 5 % PO SOLN
0.8000 mL | Freq: Once | ORAL | Status: AC
  Administered 2018-02-01: 0.8 mL via ORAL
  Filled 2018-02-01: qty 0.8

## 2018-02-05 DIAGNOSIS — M79605 Pain in left leg: Secondary | ICD-10-CM | POA: Diagnosis not present

## 2018-02-05 DIAGNOSIS — M79604 Pain in right leg: Secondary | ICD-10-CM | POA: Diagnosis not present

## 2018-02-05 DIAGNOSIS — G629 Polyneuropathy, unspecified: Secondary | ICD-10-CM | POA: Diagnosis not present

## 2018-02-05 DIAGNOSIS — R251 Tremor, unspecified: Secondary | ICD-10-CM | POA: Diagnosis not present

## 2018-02-05 DIAGNOSIS — R413 Other amnesia: Secondary | ICD-10-CM | POA: Diagnosis not present

## 2018-02-07 DIAGNOSIS — M47816 Spondylosis without myelopathy or radiculopathy, lumbar region: Secondary | ICD-10-CM | POA: Diagnosis not present

## 2018-02-13 DIAGNOSIS — H401132 Primary open-angle glaucoma, bilateral, moderate stage: Secondary | ICD-10-CM | POA: Diagnosis not present

## 2018-02-13 DIAGNOSIS — H4010X0 Unspecified open-angle glaucoma, stage unspecified: Secondary | ICD-10-CM | POA: Diagnosis not present

## 2018-02-20 DIAGNOSIS — H40053 Ocular hypertension, bilateral: Secondary | ICD-10-CM | POA: Diagnosis not present

## 2018-03-23 DIAGNOSIS — M47816 Spondylosis without myelopathy or radiculopathy, lumbar region: Secondary | ICD-10-CM | POA: Diagnosis not present

## 2018-03-27 ENCOUNTER — Ambulatory Visit: Payer: Medicare Other

## 2018-04-02 ENCOUNTER — Other Ambulatory Visit: Payer: Self-pay | Admitting: Emergency Medicine

## 2018-04-02 DIAGNOSIS — D500 Iron deficiency anemia secondary to blood loss (chronic): Secondary | ICD-10-CM

## 2018-04-02 DIAGNOSIS — D649 Anemia, unspecified: Secondary | ICD-10-CM

## 2018-04-02 DIAGNOSIS — R739 Hyperglycemia, unspecified: Secondary | ICD-10-CM

## 2018-04-02 DIAGNOSIS — Z5181 Encounter for therapeutic drug level monitoring: Secondary | ICD-10-CM

## 2018-04-02 DIAGNOSIS — E7800 Pure hypercholesterolemia, unspecified: Secondary | ICD-10-CM

## 2018-04-02 LAB — COMPLETE METABOLIC PANEL WITH GFR
AG Ratio: 1.9 (calc) (ref 1.0–2.5)
ALT: 10 U/L (ref 9–46)
AST: 15 U/L (ref 10–35)
Albumin: 4.4 g/dL (ref 3.6–5.1)
Alkaline phosphatase (APISO): 56 U/L (ref 40–115)
BUN: 13 mg/dL (ref 7–25)
CALCIUM: 9.6 mg/dL (ref 8.6–10.3)
CO2: 28 mmol/L (ref 20–32)
CREATININE: 1.09 mg/dL (ref 0.70–1.18)
Chloride: 103 mmol/L (ref 98–110)
GFR, Est African American: 75 mL/min/{1.73_m2} (ref >=60)
GFR, Est Non African American: 65 mL/min/{1.73_m2} (ref >=60)
Globulin: 2.3 g/dL (calc) (ref 1.9–3.7)
Glucose, Bld: 100 mg/dL — ABNORMAL HIGH (ref 65–99)
Potassium: 4.6 mmol/L (ref 3.5–5.3)
Sodium: 138 mmol/L (ref 135–146)
TOTAL PROTEIN: 6.7 g/dL (ref 6.1–8.1)
Total Bilirubin: 0.9 mg/dL (ref 0.2–1.2)

## 2018-04-02 LAB — LIPID PANEL
Cholesterol: 207 mg/dL — ABNORMAL HIGH (ref <200)
HDL: 50 mg/dL (ref >40)
LDL Cholesterol (Calc): 137 mg/dL (calc) — ABNORMAL HIGH
NON-HDL CHOLESTEROL (CALC): 157 mg/dL — AB (ref <130)
TRIGLYCERIDES: 96 mg/dL (ref <150)
Total CHOL/HDL Ratio: 4.1 (calc) (ref <5.0)

## 2018-04-02 LAB — CBC WITH DIFFERENTIAL/PLATELET
Absolute Monocytes: 906 cells/uL (ref 200–950)
BASOS PCT: 1.3 %
Basophils Absolute: 114 cells/uL (ref 0–200)
EOS PCT: 9.1 %
Eosinophils Absolute: 801 cells/uL — ABNORMAL HIGH (ref 15–500)
HEMATOCRIT: 41.9 % (ref 38.5–50.0)
Hemoglobin: 14.4 g/dL (ref 13.2–17.1)
LYMPHS ABS: 2869 {cells}/uL (ref 850–3900)
MCH: 31.6 pg (ref 27.0–33.0)
MCHC: 34.4 g/dL (ref 32.0–36.0)
MCV: 92.1 fL (ref 80.0–100.0)
MPV: 10.4 fL (ref 7.5–12.5)
Monocytes Relative: 10.3 %
NEUTROS PCT: 46.7 %
Neutro Abs: 4110 cells/uL (ref 1500–7800)
PLATELETS: 169 10*3/uL (ref 140–400)
RBC: 4.55 10*6/uL (ref 4.20–5.80)
RDW: 16.9 % — ABNORMAL HIGH (ref 11.0–15.0)
TOTAL LYMPHOCYTE: 32.6 %
WBC: 8.8 10*3/uL (ref 3.8–10.8)

## 2018-04-02 LAB — HEMOGLOBIN A1C
Hgb A1c MFr Bld: 5.5 %{Hb}
Mean Plasma Glucose: 111 (calc)
eAG (mmol/L): 6.2 (calc)

## 2018-04-05 ENCOUNTER — Ambulatory Visit (INDEPENDENT_AMBULATORY_CARE_PROVIDER_SITE_OTHER): Payer: Medicare Other

## 2018-04-05 VITALS — BP 128/82 | HR 69 | Temp 97.3°F | Resp 16 | Ht 65.0 in | Wt 162.9 lb

## 2018-04-05 DIAGNOSIS — Z1389 Encounter for screening for other disorder: Secondary | ICD-10-CM | POA: Insufficient documentation

## 2018-04-05 DIAGNOSIS — Z9181 History of falling: Secondary | ICD-10-CM | POA: Insufficient documentation

## 2018-04-05 DIAGNOSIS — Z0000 Encounter for general adult medical examination without abnormal findings: Secondary | ICD-10-CM | POA: Diagnosis not present

## 2018-04-05 NOTE — Progress Notes (Signed)
Subjective:   Michael Stevens is a 77 y.o. male who presents for Medicare Annual/Subsequent preventive examination.  Review of Systems:   Cardiac Risk Factors include: advanced age (>78men, >60 women);dyslipidemia;male gender     Objective:    Vitals: BP 128/82 (BP Location: Left Arm, Patient Position: Sitting, Cuff Size: Normal)   Pulse 69   Temp (!) 97.3 F (36.3 C) (Oral)   Resp 16   Ht 5\' 5"  (1.651 m)   Wt 162 lb 14.4 oz (73.9 kg)   SpO2 99%   BMI 27.11 kg/m   Body mass index is 27.11 kg/m.  Advanced Directives 04/05/2018 06/22/2017 06/21/2017 06/21/2017 03/24/2017 10/04/2016 07/25/2016  Does Patient Have a Medical Advance Directive? Yes Yes Yes Yes No No Yes  Type of Advance Directive Living will;Healthcare Power of Attorney Living will Living will Living will - - Living will;Healthcare Power of Attorney  Does patient want to make changes to medical advance directive? No - Patient declined - No - Patient declined No - Patient declined - - -  Copy of [place] in Chart? No - copy requested No - copy requested - - - - No - copy requested  Would patient like information on creating a medical advance directive? - - - - Yes (MAU/Ambulatory/Procedural Areas - Information given) - -  Pre-existing out of facility DNR order (yellow form or pink MOST form) - - - - - - -    Tobacco Social History   Tobacco Use  Smoking Status Former Smoker  . Packs/day: 2.00  . Years: 30.00  . Pack years: 60.00  . Types: Cigarettes  . Last attempt to quit: 12/01/1982  . Years since quitting: 35.3  Smokeless Tobacco Never Used  Tobacco Comment   Quit 34 years ago     Counseling given: Not Answered Comment: Quit 34 years ago   Clinical Intake:  Pre-visit preparation completed: Yes  Pain : 0-10 Pain Score: 7  Pain Type: Chronic pain Pain Location: Back Pain Orientation: Mid Pain Descriptors / Indicators: Aching Pain Onset: More than a month ago Pain Frequency: Several  days a week     Nutritional Status: BMI 25 -29 Overweight Nutritional Risks: None Diabetes: No  How often do you need to have someone help you when you read instructions, pamphlets, or other written materials from your doctor or pharmacy?: 1 - Never What is the last grade level you completed in school?: 12th grade  Interpreter Needed?: No  Information entered by :: Clemetine Marker LPN  Past Medical History:  Diagnosis Date  . Allergic rhinitis   . Anemia   . Arthritis    Back  . Bladder neck obstruction   . Dementia ([place])   . GERD (gastroesophageal reflux disease)    ocassional  . Glaucoma   . Hypertension   . Impotence   . 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 BACK SURGERY  April 2011  . COLONOSCOPY    . ESOPHAGOGASTRODUODENOSCOPY N/A 06/22/2017   Procedure: ESOPHAGOGASTRODUODENOSCOPY (EGD);  Surgeon: Toledo, Benay Pike, MD;  Location: [facility] ENDOSCOPY;  Service: Gastroenterology;  Laterality: N/A;   Family History  Problem Relation Age of Onset  . Healthy Mother   . Healthy Brother    Social History   Socioeconomic History  . Marital status: Married    Spouse name: Michael Stevens  . Number of children: 2  .  Years of education: Not on file  . Highest education level: High school graduate  Occupational History  . Occupation: Retired  Scientific laboratory technician  . Financial resource strain: Not hard at all  . Food insecurity:    Worry: Never true    Inability: Never true  . Transportation needs:    Medical: No    Non-medical: No  Tobacco Use  . Smoking status: Former Smoker    Packs/day: 2.00    Years: 30.00    Pack years: 60.00    Types: Cigarettes    Last attempt to quit: 12/01/1982    Years since quitting: 35.3  . Smokeless tobacco: Never Used  . Tobacco comment: Quit 34 years ago  Substance and Sexual Activity  . Alcohol use: Yes    Alcohol/week: 7.0 standard drinks    Types: 7  Glasses of wine per week    Comment: occasional  . Drug use: No  . Sexual activity: Not Currently  Lifestyle  . Physical activity:    Days per week: 0 days    Minutes per session: 0 min  . Stress: Not at all  Relationships  . Social connections:    Talks on phone: More than three times a week    Gets together: More than three times a week    Attends religious service: More than 4 times per year    Active member of club or organization: No    Attends meetings of clubs or organizations: Never    Relationship status: Married  Other Topics Concern  . Not on file  Social History Narrative  . Not on file    Outpatient Encounter Medications as of 04/05/2018  Medication Sig  . cetirizine (ZYRTEC) 10 MG tablet Take 10 mg by mouth daily.  Marland Kitchen donepezil (ARICEPT) 5 MG tablet Take by mouth.  . latanoprost (XALATAN) 0.005 % ophthalmic solution Place 1 drop into both eyes at bedtime.  . Misc Natural Products (MENS PROSTATE HEALTH FORMULA PO) Take by mouth.  . vitamin B-12 (CYANOCOBALAMIN) 1000 MCG tablet Take 1,000 mcg by mouth daily.  Marland Kitchen atorvastatin (LIPITOR) 20 MG tablet Take 1 tablet (20 mg total) by mouth at bedtime. (Patient not taking: Reported on 04/05/2018)  . pantoprazole (PROTONIX) 40 MG tablet Take 1 tablet (40 mg total) by mouth 2 (two) times daily.  . [DISCONTINUED] Camphor-Eucalyptus-Menthol (VICKS VAPORUB EX) Apply 1 application topically daily as needed. To clear nasal congestion  . [DISCONTINUED] gabapentin (NEURONTIN) 100 MG capsule Take 100 mg by mouth daily.   . [DISCONTINUED] rOPINIRole (REQUIP) 0.5 MG tablet Take 0.5 mg by mouth 2 (two) times daily.   . [DISCONTINUED] tiZANidine (ZANAFLEX) 2 MG tablet Take 1 tablet (2 mg total) by mouth every 8 (eight) hours as needed for muscle spasms. (Patient not taking: Reported on 10/02/2017)  . [DISCONTINUED] traMADol (ULTRAM) 50 MG tablet Take 1 tablet (50 mg total) by mouth every 12 (twelve) hours as needed for moderate pain or severe  pain. (Patient not taking: Reported on 01/03/2018)   No facility-administered encounter medications on file as of 04/05/2018.     Activities of Daily Living In your present state of health, do you have any difficulty performing the following activities: 04/05/2018 01/03/2018  Hearing? N N  Comment declines hearing aids -  Vision? N N  Comment wears glasses -  Difficulty concentrating or making decisions? Y Y  Comment per pt sometimes trouble with memory, taking aricept; feels better since coming off of gabapentin -  Walking or  climbing stairs? N Y  Dressing or bathing? N N  Doing errands, shopping? N N  Preparing Food and eating ? N -  Using the Toilet? N -  In the past six months, have you accidently leaked urine? Y -  Comment occaisional leakage but no depends -  Do you have problems with loss of bowel control? N -  Managing your Medications? N -  Managing your Finances? N -  Housekeeping or managing your Housekeeping? N -  Some recent data might be hidden    Patient Care Team: Lada, Satira Anis, MD as PCP - General (Family Medicine) Samara Deist, DPM as Referring Physician (Podiatry) Loletta Parish, MD as Referring Physician (Hematology and Oncology) Meeler, Sherren Kerns, FNP (Family Medicine) Efrain Sella, MD as Consulting Physician (Gastroenterology)   Assessment:   This is a routine wellness examination for [place].  Exercise Activities and Dietary recommendations Current Exercise Habits: The patient does not participate in regular exercise at present, Exercise limited by: neurologic condition(s)(neuropathy)  Goals    . Increase physical activity     Recommend increasing physical activity to at least 90 minutes per week.     . Prevent falls     Recommend to remove items from home that may cause slips or trips.    . Reduce sugar intake      Continue to decrease sugar intake daily.        Fall Risk Fall Risk  04/05/2018 01/03/2018 10/02/2017 06/13/2017 03/24/2017    Falls in the past year? 0 No No No No  Number falls in past yr: 0 - - - -  Risk for fall due to : - - - - Other (Comment)  Risk for fall due to: Comment - - - - wears eye glasses   FALL RISK PREVENTION PERTAINING TO THE HOME:  Any stairs in or around the home WITH handrails? No  Home free of loose throw rugs in walkways, pet beds, electrical cords, etc? Yes  Adequate lighting in your home to reduce risk of falls? Yes   ASSISTIVE DEVICES UTILIZED TO PREVENT FALLS:  Life alert? No  Use of a cane, walker or w/c? No  Grab bars in the bathroom? Yes  Shower chair or bench in shower? Yes  Elevated toilet seat or a handicapped toilet? No   DME ORDERS:  DME order needed?  No   TIMED UP AND GO:  Was the test performed? Yes .  Length of time to ambulate 10 feet: 6 sec.   GAIT:  Appearance of gait: Gait stead-fast and without the use of an assistive device. Education: Fall risk prevention has been discussed.  Intervention(s) required? No    Depression Screen PHQ 2/9 Scores 04/05/2018 01/03/2018 10/02/2017 06/13/2017  PHQ - 2 Score 0 0 0 0    Cognitive Function     6CIT Screen 04/05/2018 03/24/2017 07/25/2016  What Year? 0 points 4 points 0 points  What month? 0 points 0 points 0 points  What time? 0 points 0 points 0 points  Count back from 20 0 points 0 points 0 points  Months in reverse 0 points 0 points 2 points  Repeat phrase 6 points 6 points 2 points  Total Score 6 10 4     Immunization History  Administered Date(s) Administered  . Influenza, High Dose Seasonal PF 01/03/2018  . Influenza-Unspecified 01/17/2015, 03/01/2016  . Pneumococcal Conjugate-13 03/24/2017  . Pneumococcal Polysaccharide-23 04/18/2010    Qualifies for Shingles Vaccine? Yes . Due for  Shingrix. Education has been provided regarding the importance of this vaccine. Pt has been advised to call insurance company to determine out of pocket expense. Advised may also receive vaccine at local pharmacy or  Health Dept. Verbalized acceptance and understanding.  Tdap: Although this vaccine is not a covered service during a Wellness Exam, does the patient still wish to receive this vaccine today?  No .  Education has been provided regarding the importance of this vaccine. Advised may receive this vaccine at local pharmacy or Health Dept. Aware to provide a copy of the vaccination record if obtained from local pharmacy or Health Dept. Verbalized acceptance and understanding.  Flu Vaccine: Up to date  Pneumococcal Vaccine: Up to date    Screening Tests Health Maintenance  Topic Date Due  . TETANUS/TDAP  04/18/2026 (Originally 01/11/1960)  . INFLUENZA VACCINE  Completed  . PNA vac Low Risk Adult  Completed   Cancer Screenings:  Colorectal Screening: Completed 03/11/14.  No longer required.   Lung Cancer Screening: (Low Dose CT Chest recommended if Age 29-80 years, 30 pack-year currently smoking OR have quit w/in 15years.) does not qualify.    Additional Screening:  Hepatitis C Screening: no longer required  Vision Screening: Recommended annual ophthalmology exams for early detection of glaucoma and other disorders of the eye. Is the patient up to date with their annual eye exam?  Yes  Who is the provider or what is the name of the office in which the pt attends annual eye exams? [place] Screening: Recommended annual dental exams for proper oral hygiene  Community Resource Referral:  CRR required this visit?  No       Plan:    I have personally reviewed and addressed the Medicare Annual Wellness questionnaire and have noted the following in the patient's chart:  A. Medical and social history B. Use of alcohol, tobacco or illicit drugs  C. Current medications and supplements D. Functional ability and status E.  Nutritional status F.  Physical activity G. Advance directives H. List of other physicians I.  Hospitalizations, surgeries, and ER visits in previous  12 months J.  [place] such as hearing and vision if needed, cognitive and depression L. Referrals and appointments   In addition, I have reviewed and discussed with patient certain preventive protocols, quality metrics, and best practice recommendations. A written personalized care plan for preventive services as well as general preventive health recommendations were provided to patient.   Signed,  Clemetine Marker, LPN Nurse Health Advisor   Nurse Notes: Pt accompanied by his wife to visit today. Pt stopped gabapentin and started aricept in October. Pt states he feels like he is doing well with the medication. Pt's wife stopped his atorvastatin in March and plans to restart it after review of lipid panel drawn this week. Pt appreciative of visit today and has scheduled follow up in March.

## 2018-04-05 NOTE — Patient Instructions (Signed)
Michael Stevens , Thank you for taking time to come for your Medicare Wellness Visit. I appreciate your ongoing commitment to your health goals. Please review the following plan we discussed and let me know if I can assist you in the future.   Screening recommendations/referrals: Recommended yearly ophthalmology/optometry visit for glaucoma screening and checkup Recommended yearly dental visit for hygiene and checkup  Vaccinations: Influenza vaccine: done 01/03/18 Pneumococcal vaccine: done 03/24/17 Tdap vaccine: due - please contact us if you get a cut or scrape Shingles vaccine: Shingrix discussed. Please contact your pharmacy for coverage information.     Advanced directives: Please bring a copy of your health care power of attorney and living will to the office at your convenience.  Conditions/risks identified: Recommend increasing physical activity to at least 90 minutes per week.   Next appointment: Please follow up in one year for your Medicare Annual Wellness visit.    Preventive Care 30 Years and Older, Male Preventive care refers to lifestyle choices and visits with your health care provider that can promote health and wellness. What does preventive care include?  A yearly physical exam. This is also called an annual well check.  Dental exams once or twice a year.  Routine eye exams. Ask your health care provider how often you should have your eyes checked.  Personal lifestyle choices, including:  Daily care of your teeth and gums.  Regular physical activity.  Eating a healthy diet.  Avoiding tobacco and drug use.  Limiting alcohol use.  Practicing safe sex.  Taking low doses of aspirin every day.  Taking vitamin and mineral supplements as recommended by your health care provider. What happens during an annual well check? The services and screenings done by your health care provider during your annual well check will depend on your age, overall health, lifestyle  risk factors, and family history of disease. Counseling  Your health care provider may ask you questions about your:  Alcohol use.  Tobacco use.  Drug use.  Emotional well-being.  Home and relationship well-being.  Sexual activity.  Eating habits.  History of falls.  Memory and ability to understand (cognition).  Work and work Statistician. Screening  You may have the following tests or measurements:  Height, weight, and BMI.  Blood pressure.  Lipid and cholesterol levels. These may be checked every 5 years, or more frequently if you are over 54 years old.  Skin check.  Lung cancer screening. You may have this screening every year starting at age 13 if you have a 30-pack-year history of smoking and currently smoke or have quit within the past 15 years.  Fecal occult blood test (FOBT) of the stool. You may have this test every year starting at age 52.  Flexible sigmoidoscopy or colonoscopy. You may have a sigmoidoscopy every 5 years or a colonoscopy every 10 years starting at age 45.  Prostate cancer screening. Recommendations will vary depending on your family history and other risks.  Hepatitis C blood test.  Hepatitis B blood test.  Sexually transmitted disease (STD) testing.  Diabetes screening. This is done by checking your blood sugar (glucose) after you have not eaten for a while (fasting). You may have this done every 1-3 years.  Abdominal aortic aneurysm (AAA) screening. You may need this if you are a current or former smoker.  Osteoporosis. You may be screened starting at age 65 if you are at high risk. Talk with your health care provider about your test results, treatment options, and  if necessary, the need for more tests. Vaccines  Your health care provider may recommend certain vaccines, such as:  Influenza vaccine. This is recommended every year.  Tetanus, diphtheria, and acellular pertussis (Tdap, Td) vaccine. You may need a Td booster every 10  years.  Zoster vaccine. You may need this after age 52.  Pneumococcal 13-valent conjugate (PCV13) vaccine. One dose is recommended after age 82.  Pneumococcal polysaccharide (PPSV23) vaccine. One dose is recommended after age 40. Talk to your health care provider about which screenings and vaccines you need and how often you need them. This information is not intended to replace advice given to you by your health care provider. Make sure you discuss any questions you have with your health care provider. Document Released: 05/01/2015 Document Revised: 12/23/2015 Document Reviewed: 02/03/2015 Elsevier Interactive Patient Education  2017 [place] Prevention in the Home Falls can cause injuries. They can happen to people of all ages. There are many things you can do to make your home safe and to help prevent falls. What can I do on the outside of my home?  Regularly fix the edges of walkways and driveways and fix any cracks.  Remove anything that might make you trip as you walk through a door, such as a raised step or threshold.  Trim any bushes or trees on the path to your home.  Use bright outdoor lighting.  Clear any walking paths of anything that might make someone trip, such as rocks or tools.  Regularly check to see if handrails are loose or broken. Make sure that both sides of any steps have handrails.  Any raised decks and porches should have guardrails on the edges.  Have any leaves, snow, or ice cleared regularly.  Use sand or salt on walking paths during winter.  Clean up any spills in your garage right away. This includes oil or grease spills. What can I do in the bathroom?  Use night lights.  Install grab bars by the toilet and in the tub and shower. Do not use towel bars as grab bars.  Use non-skid mats or decals in the tub or shower.  If you need to sit down in the shower, use a plastic, non-slip stool.  Keep the floor dry. Clean up any water that  spills on the floor as soon as it happens.  Remove soap buildup in the tub or shower regularly.  Attach bath mats securely with double-sided non-slip rug tape.  Do not have throw rugs and other things on the floor that can make you trip. What can I do in the bedroom?  Use night lights.  Make sure that you have a light by your bed that is easy to reach.  Do not use any sheets or blankets that are too big for your bed. They should not hang down onto the floor.  Have a firm chair that has side arms. You can use this for support while you get dressed.  Do not have throw rugs and other things on the floor that can make you trip. What can I do in the kitchen?  Clean up any spills right away.  Avoid walking on wet floors.  Keep items that you use a lot in easy-to-reach places.  If you need to reach something above you, use a strong step stool that has a grab bar.  Keep electrical cords out of the way.  Do not use floor polish or wax that makes floors slippery. If you  must use wax, use non-skid floor wax.  Do not have throw rugs and other things on the floor that can make you trip. What can I do with my stairs?  Do not leave any items on the stairs.  Make sure that there are handrails on both sides of the stairs and use them. Fix handrails that are broken or loose. Make sure that handrails are as long as the stairways.  Check any carpeting to make sure that it is firmly attached to the stairs. Fix any carpet that is loose or worn.  Avoid having throw rugs at the top or bottom of the stairs. If you do have throw rugs, attach them to the floor with carpet tape.  Make sure that you have a light switch at the top of the stairs and the bottom of the stairs. If you do not have them, ask someone to add them for you. What else can I do to help prevent falls?  Wear shoes that:  Do not have high heels.  Have rubber bottoms.  Are comfortable and fit you well.  Are closed at the  toe. Do not wear sandals.  If you use a stepladder:  Make sure that it is fully opened. Do not climb a closed stepladder.  Make sure that both sides of the stepladder are locked into place.  Ask someone to hold it for you, if possible.  Clearly mark and make sure that you can see:  Any grab bars or handrails.  First and last steps.  Where the edge of each step is.  Use tools that help you move around (mobility aids) if they are needed. These include:  Canes.  Walkers.  Scooters.  Crutches.  Turn on the lights when you go into a dark area. Replace any light bulbs as soon as they burn out.  Set up your furniture so you have a clear path. Avoid moving your furniture around.  If any of your floors are uneven, fix them.  If there are any pets around you, be aware of where they are.  Review your medicines with your doctor. Some medicines can make you feel dizzy. This can increase your chance of falling. Ask your doctor what other things that you can do to help prevent falls. This information is not intended to replace advice given to you by your health care provider. Make sure you discuss any questions you have with your health care provider. Document Released: 01/29/2009 Document Revised: 09/10/2015 Document Reviewed: 05/09/2014 Elsevier Interactive Patient Education  2017 [employer].

## 2018-04-27 DIAGNOSIS — M62830 Muscle spasm of back: Secondary | ICD-10-CM | POA: Diagnosis not present

## 2018-04-27 DIAGNOSIS — M47816 Spondylosis without myelopathy or radiculopathy, lumbar region: Secondary | ICD-10-CM | POA: Diagnosis not present

## 2018-06-08 DIAGNOSIS — M5136 Other intervertebral disc degeneration, lumbar region: Secondary | ICD-10-CM | POA: Diagnosis not present

## 2018-06-08 DIAGNOSIS — M5416 Radiculopathy, lumbar region: Secondary | ICD-10-CM | POA: Diagnosis not present

## 2018-06-08 DIAGNOSIS — M62830 Muscle spasm of back: Secondary | ICD-10-CM | POA: Diagnosis not present

## 2018-06-20 DIAGNOSIS — K1321 Leukoplakia of oral mucosa, including tongue: Secondary | ICD-10-CM | POA: Diagnosis not present

## 2018-07-06 ENCOUNTER — Ambulatory Visit: Payer: Medicare Other

## 2018-07-09 ENCOUNTER — Ambulatory Visit: Payer: Medicare Other | Admitting: Family Medicine

## 2018-07-10 ENCOUNTER — Ambulatory Visit: Payer: Medicare Other

## 2018-07-12 ENCOUNTER — Ambulatory Visit: Payer: Medicare Other | Admitting: Nurse Practitioner

## 2018-07-12 ENCOUNTER — Other Ambulatory Visit: Payer: Self-pay

## 2018-07-16 ENCOUNTER — Ambulatory Visit (INDEPENDENT_AMBULATORY_CARE_PROVIDER_SITE_OTHER): Payer: Medicare Other | Admitting: Nurse Practitioner

## 2018-07-16 ENCOUNTER — Telehealth: Payer: Self-pay

## 2018-07-16 NOTE — Progress Notes (Deleted)
Name: Michael Stevens   MRN: [id number]    DOB: 1941/01/24   Date:07/16/2018       Progress Note  Subjective  Chief Complaint  No chief complaint on file.   HPI  Virtual Visit via Telephone Note  I connected with Michael Stevens on 07/16/18 at  9:00 AM EDT by telephone and verified that I am speaking with the correct person using two identifiers.   Staff discussed the limitations, risks, security and privacy concerns of performing an evaluation and management service by telephone and the availability of in person appointments.  The patient expressed understanding and agreed to proceed. I was located in the office. Patient was located ***   Hypertension Not currently on medication  BP Readings from Last 3 Encounters:  04/05/18 128/82  01/03/18 126/78  10/02/17 122/80   Hyperlipidemia Takes lipitor 20 mg daily.  Lab Results  Component Value Date   CHOL 207 (H) 04/02/2018   HDL 50 04/02/2018   LDLCALC 137 (H) 04/02/2018   TRIG 96 04/02/2018   CHOLHDL 4.1 04/02/2018    GERD & Duodenal ulcer Follows up with GI- Dr. Alice Reichert, takes protonix 40mg     Dementia Sees Dr. Melrose Nakayama- neurology, started on aricept 5mg  nightly and advised to start taking B12 1059mcg supplement.      PHQ2/9: Depression screen [facility] 2/9 04/05/2018 01/03/2018 10/02/2017 06/13/2017 03/24/2017  Decreased Interest 0 0 0 0 0  Down, Depressed, Hopeless 0 0 0 0 0  PHQ - 2 Score 0 0 0 0 0     PHQ reviewed. {Pos/neg/not test:140017::"Negative"}  Patient Active Problem List   Diagnosis Date Noted  . At risk for falling 04/05/2018  . Screening for gout 04/05/2018  . 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 Tremor 11/27/2017  . Leg pain, bilateral 11/07/2017  . Neuropathy 11/07/2017  . Duodenal ulcer with hemorrhage 09/05/2017  . Abnormal EKG 01/31/2017  . Right bundle branch block 01/31/2017  . 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 Hyperglycemia 12/22/2015   . DDD (degenerative disc disease), lumbar 07/14/2015  . Internal hemorrhoids 02/11/2015  . Male erectile dysfunction, unspecified 02/11/2015  . Anemia 01/14/2015  . Allergic rhinitis 10/13/2014  . 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 Diverticulosis of intestine 10/13/2014  . Gastric catarrh 10/13/2014  . Gastroesophageal reflux disease 10/13/2014  . Glaucoma 10/13/2014  . Hypercholesteremia 10/13/2014  . Essential (primary) hypertension 10/13/2014  . Occult blood in stools 02/06/2014  . 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 Allergic rhinitis   . Anemia   . Arthritis    Back  . Bladder neck obstruction   . Dementia ([place])   . GERD (gastroesophageal reflux disease)    ocassional  . Glaucoma   . Hypertension   . Impotence   . 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 BACK SURGERY  April 2011  . COLONOSCOPY    . ESOPHAGOGASTRODUODENOSCOPY N/A 06/22/2017   Procedure: ESOPHAGOGASTRODUODENOSCOPY (EGD);  Surgeon: Toledo, Benay Pike, MD;  Location: [facility] ENDOSCOPY;  Service: Gastroenterology;  Laterality: N/A;    Social History   Tobacco Use  . Smoking status: Former Smoker    Packs/day: 2.00    Years: 30.00    Pack years: 60.00  Types: Cigarettes    Last attempt to quit: 12/01/1982    Years since quitting: 35.6  . Smokeless tobacco: Never Used  . Tobacco comment: Quit 34 years ago  Substance Use Topics  . Alcohol use: Yes    Alcohol/week: 7.0 standard drinks    Types: 7 Glasses of wine per week    Comment: occasional     Current Outpatient Medications:  .  atorvastatin (LIPITOR) 20 MG tablet, Take 1 tablet (20 mg total) by mouth at bedtime. (Patient not taking: Reported on 04/05/2018), Disp: 90 tablet, Rfl: 3 .  cetirizine (ZYRTEC) 10 MG tablet, Take 10 mg  by mouth daily., Disp: , Rfl:  .  donepezil (ARICEPT) 5 MG tablet, Take by mouth., Disp: , Rfl:  .  latanoprost (XALATAN) 0.005 % ophthalmic solution, Place 1 drop into both eyes at bedtime., Disp: 2.5 mL, Rfl:  .  Misc Natural Products (MENS PROSTATE HEALTH FORMULA PO), Take by mouth., Disp: , Rfl:  .  pantoprazole (PROTONIX) 40 MG tablet, Take 1 tablet (40 mg total) by mouth 2 (two) times daily., Disp: 60 tablet, Rfl: 2 .  vitamin B-12 (CYANOCOBALAMIN) 1000 MCG tablet, Take 1,000 mcg by mouth daily., Disp: , Rfl:   No Known Allergies  ROS    No other specific complaints in a complete review of systems (except as listed in HPI above).  Objective  There were no vitals filed for this visit. ***  There is no height or weight on file to calculate BMI.  Nursing Note and Vital Signs reviewed.  Physical Exam  ***   No results found for this or any previous visit (from the past 48 hour(s)).  Assessment & Plan  1. Hypercholesteremia ***  2. Essential hypertension ***  3. Gastroesophageal reflux disease, esophagitis presence not specified ***  4. Memory deficits ***    I discussed the assessment and treatment plan with the patient. The patient was provided an opportunity to ask questions and all were answered. The patient agreed with the plan and demonstrated an understanding of the instructions.   The patient was advised to call back or seek an in-person evaluation if the symptoms worsen or if the condition fails to improve as anticipated.  I provided *** minutes of non-face-to-face time during this encounter.   Fredderick Severance, NP

## 2018-07-16 NOTE — Telephone Encounter (Signed)
Per the request of Suezanne Cheshire, DNP  I tried to contact this patient again to see if we could reschedule him but again there was no answer. A message was left for him to give [place] a call back at his convenience.

## 2018-08-13 DIAGNOSIS — Z85828 Personal history of other malignant neoplasm of skin: Secondary | ICD-10-CM | POA: Diagnosis not present

## 2018-08-13 DIAGNOSIS — L578 Other skin changes due to chronic exposure to nonionizing radiation: Secondary | ICD-10-CM | POA: Diagnosis not present

## 2018-08-13 DIAGNOSIS — Z872 Personal history of diseases of the skin and subcutaneous tissue: Secondary | ICD-10-CM | POA: Diagnosis not present

## 2018-08-13 DIAGNOSIS — L570 Actinic keratosis: Secondary | ICD-10-CM | POA: Diagnosis not present

## 2018-08-17 DIAGNOSIS — D3709 Neoplasm of uncertain behavior of other specified sites of the oral cavity: Secondary | ICD-10-CM | POA: Diagnosis not present

## 2018-08-17 DIAGNOSIS — C030 Malignant neoplasm of upper gum: Secondary | ICD-10-CM | POA: Diagnosis not present

## 2018-08-17 DIAGNOSIS — C069 Malignant neoplasm of mouth, unspecified: Secondary | ICD-10-CM | POA: Diagnosis not present

## 2018-08-17 DIAGNOSIS — K1321 Leukoplakia of oral mucosa, including tongue: Secondary | ICD-10-CM | POA: Diagnosis not present

## 2018-08-24 DIAGNOSIS — H40053 Ocular hypertension, bilateral: Secondary | ICD-10-CM | POA: Diagnosis not present

## 2018-08-28 DIAGNOSIS — C148 Malignant neoplasm of overlapping sites of lip, oral cavity and pharynx: Secondary | ICD-10-CM | POA: Diagnosis not present

## 2018-09-03 DIAGNOSIS — D3709 Neoplasm of uncertain behavior of other specified sites of the oral cavity: Secondary | ICD-10-CM | POA: Diagnosis not present

## 2018-09-03 DIAGNOSIS — D3701 Neoplasm of uncertain behavior of lip: Secondary | ICD-10-CM | POA: Diagnosis not present

## 2018-09-03 DIAGNOSIS — K1321 Leukoplakia of oral mucosa, including tongue: Secondary | ICD-10-CM | POA: Diagnosis not present

## 2018-09-03 DIAGNOSIS — K1370 Unspecified lesions of oral mucosa: Secondary | ICD-10-CM | POA: Diagnosis not present

## 2018-09-03 DIAGNOSIS — K1379 Other lesions of oral mucosa: Secondary | ICD-10-CM | POA: Diagnosis not present

## 2018-09-03 DIAGNOSIS — C069 Malignant neoplasm of mouth, unspecified: Secondary | ICD-10-CM

## 2018-09-03 DIAGNOSIS — Z87891 Personal history of nicotine dependence: Secondary | ICD-10-CM | POA: Diagnosis not present

## 2018-09-03 DIAGNOSIS — D3705 Neoplasm of uncertain behavior of pharynx: Secondary | ICD-10-CM | POA: Diagnosis not present

## 2018-09-03 HISTORY — DX: Malignant neoplasm of mouth, unspecified: C06.9

## 2018-09-09 DIAGNOSIS — K1321 Leukoplakia of oral mucosa, including tongue: Secondary | ICD-10-CM | POA: Diagnosis not present

## 2018-09-09 DIAGNOSIS — C069 Malignant neoplasm of mouth, unspecified: Secondary | ICD-10-CM | POA: Diagnosis not present

## 2018-09-09 DIAGNOSIS — D3701 Neoplasm of uncertain behavior of lip: Secondary | ICD-10-CM | POA: Diagnosis not present

## 2018-09-09 DIAGNOSIS — D3705 Neoplasm of uncertain behavior of pharynx: Secondary | ICD-10-CM | POA: Diagnosis not present

## 2018-09-09 DIAGNOSIS — R918 Other nonspecific abnormal finding of lung field: Secondary | ICD-10-CM | POA: Diagnosis not present

## 2018-09-09 DIAGNOSIS — D3709 Neoplasm of uncertain behavior of other specified sites of the oral cavity: Secondary | ICD-10-CM | POA: Diagnosis not present

## 2018-09-09 DIAGNOSIS — R911 Solitary pulmonary nodule: Secondary | ICD-10-CM | POA: Diagnosis not present

## 2018-09-11 DIAGNOSIS — M9903 Segmental and somatic dysfunction of lumbar region: Secondary | ICD-10-CM | POA: Diagnosis not present

## 2018-09-11 DIAGNOSIS — M9902 Segmental and somatic dysfunction of thoracic region: Secondary | ICD-10-CM | POA: Diagnosis not present

## 2018-09-11 DIAGNOSIS — M9905 Segmental and somatic dysfunction of pelvic region: Secondary | ICD-10-CM | POA: Diagnosis not present

## 2018-09-11 DIAGNOSIS — M955 Acquired deformity of pelvis: Secondary | ICD-10-CM | POA: Diagnosis not present

## 2018-09-11 DIAGNOSIS — M5136 Other intervertebral disc degeneration, lumbar region: Secondary | ICD-10-CM | POA: Diagnosis not present

## 2018-09-13 DIAGNOSIS — M5136 Other intervertebral disc degeneration, lumbar region: Secondary | ICD-10-CM | POA: Diagnosis not present

## 2018-09-13 DIAGNOSIS — M955 Acquired deformity of pelvis: Secondary | ICD-10-CM | POA: Diagnosis not present

## 2018-09-13 DIAGNOSIS — M9905 Segmental and somatic dysfunction of pelvic region: Secondary | ICD-10-CM | POA: Diagnosis not present

## 2018-09-13 DIAGNOSIS — M9903 Segmental and somatic dysfunction of lumbar region: Secondary | ICD-10-CM | POA: Diagnosis not present

## 2018-09-13 DIAGNOSIS — M9902 Segmental and somatic dysfunction of thoracic region: Secondary | ICD-10-CM | POA: Diagnosis not present

## 2018-09-15 DIAGNOSIS — Z1159 Encounter for screening for other viral diseases: Secondary | ICD-10-CM | POA: Diagnosis not present

## 2018-09-17 DIAGNOSIS — M9902 Segmental and somatic dysfunction of thoracic region: Secondary | ICD-10-CM | POA: Diagnosis not present

## 2018-09-17 DIAGNOSIS — M9903 Segmental and somatic dysfunction of lumbar region: Secondary | ICD-10-CM | POA: Diagnosis not present

## 2018-09-17 DIAGNOSIS — M9905 Segmental and somatic dysfunction of pelvic region: Secondary | ICD-10-CM | POA: Diagnosis not present

## 2018-09-17 DIAGNOSIS — M5136 Other intervertebral disc degeneration, lumbar region: Secondary | ICD-10-CM | POA: Diagnosis not present

## 2018-09-17 DIAGNOSIS — M955 Acquired deformity of pelvis: Secondary | ICD-10-CM | POA: Diagnosis not present

## 2018-09-18 DIAGNOSIS — H409 Unspecified glaucoma: Secondary | ICD-10-CM | POA: Diagnosis not present

## 2018-09-18 DIAGNOSIS — K648 Other hemorrhoids: Secondary | ICD-10-CM | POA: Diagnosis not present

## 2018-09-18 DIAGNOSIS — K1321 Leukoplakia of oral mucosa, including tongue: Secondary | ICD-10-CM | POA: Diagnosis not present

## 2018-09-18 DIAGNOSIS — K5790 Diverticulosis of intestine, part unspecified, without perforation or abscess without bleeding: Secondary | ICD-10-CM | POA: Diagnosis not present

## 2018-09-18 DIAGNOSIS — Z85819 Personal history of malignant neoplasm of unspecified site of lip, oral cavity, and pharynx: Secondary | ICD-10-CM | POA: Diagnosis not present

## 2018-09-18 DIAGNOSIS — C001 Malignant neoplasm of external lower lip: Secondary | ICD-10-CM | POA: Diagnosis not present

## 2018-09-18 DIAGNOSIS — K2970 Gastritis, unspecified, without bleeding: Secondary | ICD-10-CM | POA: Diagnosis not present

## 2018-09-18 DIAGNOSIS — Z87891 Personal history of nicotine dependence: Secondary | ICD-10-CM | POA: Diagnosis not present

## 2018-09-18 DIAGNOSIS — C4402 Squamous cell carcinoma of skin of lip: Secondary | ICD-10-CM | POA: Diagnosis not present

## 2018-09-18 DIAGNOSIS — I10 Essential (primary) hypertension: Secondary | ICD-10-CM | POA: Diagnosis not present

## 2018-09-18 DIAGNOSIS — E7800 Pure hypercholesterolemia, unspecified: Secondary | ICD-10-CM | POA: Diagnosis not present

## 2018-09-18 DIAGNOSIS — D649 Anemia, unspecified: Secondary | ICD-10-CM | POA: Diagnosis not present

## 2018-09-19 DIAGNOSIS — Z87891 Personal history of nicotine dependence: Secondary | ICD-10-CM | POA: Diagnosis not present

## 2018-09-19 DIAGNOSIS — K5790 Diverticulosis of intestine, part unspecified, without perforation or abscess without bleeding: Secondary | ICD-10-CM | POA: Diagnosis not present

## 2018-09-19 DIAGNOSIS — D649 Anemia, unspecified: Secondary | ICD-10-CM | POA: Diagnosis not present

## 2018-09-19 DIAGNOSIS — C001 Malignant neoplasm of external lower lip: Secondary | ICD-10-CM | POA: Diagnosis not present

## 2018-09-19 DIAGNOSIS — K2970 Gastritis, unspecified, without bleeding: Secondary | ICD-10-CM | POA: Diagnosis not present

## 2018-09-19 DIAGNOSIS — K1321 Leukoplakia of oral mucosa, including tongue: Secondary | ICD-10-CM | POA: Diagnosis not present

## 2018-09-19 DIAGNOSIS — K648 Other hemorrhoids: Secondary | ICD-10-CM | POA: Diagnosis not present

## 2018-09-19 DIAGNOSIS — Z85819 Personal history of malignant neoplasm of unspecified site of lip, oral cavity, and pharynx: Secondary | ICD-10-CM | POA: Diagnosis not present

## 2018-09-19 DIAGNOSIS — H409 Unspecified glaucoma: Secondary | ICD-10-CM | POA: Diagnosis not present

## 2018-09-19 DIAGNOSIS — I10 Essential (primary) hypertension: Secondary | ICD-10-CM | POA: Diagnosis not present

## 2018-09-19 DIAGNOSIS — E7800 Pure hypercholesterolemia, unspecified: Secondary | ICD-10-CM | POA: Diagnosis not present

## 2018-09-24 DIAGNOSIS — C069 Malignant neoplasm of mouth, unspecified: Secondary | ICD-10-CM | POA: Diagnosis not present

## 2018-09-24 DIAGNOSIS — Z09 Encounter for follow-up examination after completed treatment for conditions other than malignant neoplasm: Secondary | ICD-10-CM | POA: Diagnosis not present

## 2018-09-25 DIAGNOSIS — M5136 Other intervertebral disc degeneration, lumbar region: Secondary | ICD-10-CM | POA: Diagnosis not present

## 2018-09-25 DIAGNOSIS — M9905 Segmental and somatic dysfunction of pelvic region: Secondary | ICD-10-CM | POA: Diagnosis not present

## 2018-09-25 DIAGNOSIS — M955 Acquired deformity of pelvis: Secondary | ICD-10-CM | POA: Diagnosis not present

## 2018-09-25 DIAGNOSIS — M9903 Segmental and somatic dysfunction of lumbar region: Secondary | ICD-10-CM | POA: Diagnosis not present

## 2018-09-25 DIAGNOSIS — M9902 Segmental and somatic dysfunction of thoracic region: Secondary | ICD-10-CM | POA: Diagnosis not present

## 2018-09-27 DIAGNOSIS — M5136 Other intervertebral disc degeneration, lumbar region: Secondary | ICD-10-CM | POA: Diagnosis not present

## 2018-09-27 DIAGNOSIS — M9905 Segmental and somatic dysfunction of pelvic region: Secondary | ICD-10-CM | POA: Diagnosis not present

## 2018-09-27 DIAGNOSIS — M9902 Segmental and somatic dysfunction of thoracic region: Secondary | ICD-10-CM | POA: Diagnosis not present

## 2018-09-27 DIAGNOSIS — M955 Acquired deformity of pelvis: Secondary | ICD-10-CM | POA: Diagnosis not present

## 2018-09-27 DIAGNOSIS — M9903 Segmental and somatic dysfunction of lumbar region: Secondary | ICD-10-CM | POA: Diagnosis not present

## 2018-10-01 DIAGNOSIS — M9902 Segmental and somatic dysfunction of thoracic region: Secondary | ICD-10-CM | POA: Diagnosis not present

## 2018-10-01 DIAGNOSIS — M955 Acquired deformity of pelvis: Secondary | ICD-10-CM | POA: Diagnosis not present

## 2018-10-01 DIAGNOSIS — M9903 Segmental and somatic dysfunction of lumbar region: Secondary | ICD-10-CM | POA: Diagnosis not present

## 2018-10-01 DIAGNOSIS — M9905 Segmental and somatic dysfunction of pelvic region: Secondary | ICD-10-CM | POA: Diagnosis not present

## 2018-10-01 DIAGNOSIS — M5136 Other intervertebral disc degeneration, lumbar region: Secondary | ICD-10-CM | POA: Diagnosis not present

## 2018-10-03 DIAGNOSIS — M9905 Segmental and somatic dysfunction of pelvic region: Secondary | ICD-10-CM | POA: Diagnosis not present

## 2018-10-03 DIAGNOSIS — M9902 Segmental and somatic dysfunction of thoracic region: Secondary | ICD-10-CM | POA: Diagnosis not present

## 2018-10-03 DIAGNOSIS — M955 Acquired deformity of pelvis: Secondary | ICD-10-CM | POA: Diagnosis not present

## 2018-10-03 DIAGNOSIS — M9903 Segmental and somatic dysfunction of lumbar region: Secondary | ICD-10-CM | POA: Diagnosis not present

## 2018-10-03 DIAGNOSIS — M5136 Other intervertebral disc degeneration, lumbar region: Secondary | ICD-10-CM | POA: Diagnosis not present

## 2018-10-04 DIAGNOSIS — M5136 Other intervertebral disc degeneration, lumbar region: Secondary | ICD-10-CM | POA: Diagnosis not present

## 2018-10-04 DIAGNOSIS — M9903 Segmental and somatic dysfunction of lumbar region: Secondary | ICD-10-CM | POA: Diagnosis not present

## 2018-10-04 DIAGNOSIS — M9905 Segmental and somatic dysfunction of pelvic region: Secondary | ICD-10-CM | POA: Diagnosis not present

## 2018-10-04 DIAGNOSIS — M9902 Segmental and somatic dysfunction of thoracic region: Secondary | ICD-10-CM | POA: Diagnosis not present

## 2018-10-04 DIAGNOSIS — M955 Acquired deformity of pelvis: Secondary | ICD-10-CM | POA: Diagnosis not present

## 2018-10-05 DIAGNOSIS — C148 Malignant neoplasm of overlapping sites of lip, oral cavity and pharynx: Secondary | ICD-10-CM | POA: Diagnosis not present

## 2018-10-08 DIAGNOSIS — M9905 Segmental and somatic dysfunction of pelvic region: Secondary | ICD-10-CM | POA: Diagnosis not present

## 2018-10-08 DIAGNOSIS — M9903 Segmental and somatic dysfunction of lumbar region: Secondary | ICD-10-CM | POA: Diagnosis not present

## 2018-10-08 DIAGNOSIS — M5136 Other intervertebral disc degeneration, lumbar region: Secondary | ICD-10-CM | POA: Diagnosis not present

## 2018-10-08 DIAGNOSIS — M9902 Segmental and somatic dysfunction of thoracic region: Secondary | ICD-10-CM | POA: Diagnosis not present

## 2018-10-08 DIAGNOSIS — M955 Acquired deformity of pelvis: Secondary | ICD-10-CM | POA: Diagnosis not present

## 2018-10-10 DIAGNOSIS — M955 Acquired deformity of pelvis: Secondary | ICD-10-CM | POA: Diagnosis not present

## 2018-10-10 DIAGNOSIS — M9905 Segmental and somatic dysfunction of pelvic region: Secondary | ICD-10-CM | POA: Diagnosis not present

## 2018-10-10 DIAGNOSIS — M9903 Segmental and somatic dysfunction of lumbar region: Secondary | ICD-10-CM | POA: Diagnosis not present

## 2018-10-10 DIAGNOSIS — M9902 Segmental and somatic dysfunction of thoracic region: Secondary | ICD-10-CM | POA: Diagnosis not present

## 2018-10-10 DIAGNOSIS — M5136 Other intervertebral disc degeneration, lumbar region: Secondary | ICD-10-CM | POA: Diagnosis not present

## 2018-10-12 DIAGNOSIS — M9903 Segmental and somatic dysfunction of lumbar region: Secondary | ICD-10-CM | POA: Diagnosis not present

## 2018-10-12 DIAGNOSIS — M9905 Segmental and somatic dysfunction of pelvic region: Secondary | ICD-10-CM | POA: Diagnosis not present

## 2018-10-12 DIAGNOSIS — M5136 Other intervertebral disc degeneration, lumbar region: Secondary | ICD-10-CM | POA: Diagnosis not present

## 2018-10-12 DIAGNOSIS — M9902 Segmental and somatic dysfunction of thoracic region: Secondary | ICD-10-CM | POA: Diagnosis not present

## 2018-10-12 DIAGNOSIS — M955 Acquired deformity of pelvis: Secondary | ICD-10-CM | POA: Diagnosis not present

## 2018-10-16 DIAGNOSIS — M5136 Other intervertebral disc degeneration, lumbar region: Secondary | ICD-10-CM | POA: Diagnosis not present

## 2018-10-16 DIAGNOSIS — M9905 Segmental and somatic dysfunction of pelvic region: Secondary | ICD-10-CM | POA: Diagnosis not present

## 2018-10-16 DIAGNOSIS — M9902 Segmental and somatic dysfunction of thoracic region: Secondary | ICD-10-CM | POA: Diagnosis not present

## 2018-10-16 DIAGNOSIS — M955 Acquired deformity of pelvis: Secondary | ICD-10-CM | POA: Diagnosis not present

## 2018-10-16 DIAGNOSIS — M9903 Segmental and somatic dysfunction of lumbar region: Secondary | ICD-10-CM | POA: Diagnosis not present

## 2018-10-23 DIAGNOSIS — C148 Malignant neoplasm of overlapping sites of lip, oral cavity and pharynx: Secondary | ICD-10-CM | POA: Diagnosis not present

## 2018-10-24 ENCOUNTER — Other Ambulatory Visit: Payer: Self-pay | Admitting: Family Medicine

## 2018-10-24 DIAGNOSIS — E7800 Pure hypercholesterolemia, unspecified: Secondary | ICD-10-CM

## 2018-10-24 NOTE — Telephone Encounter (Signed)
lvm informing pt that prescription has been sent to pharmacy also to schedule appt within the next 15 days.

## 2018-10-24 NOTE — Telephone Encounter (Signed)
Please schedule patient for follow up in the next 15 days.

## 2018-11-02 ENCOUNTER — Other Ambulatory Visit: Payer: Self-pay | Admitting: Family Medicine

## 2018-11-02 DIAGNOSIS — E7800 Pure hypercholesterolemia, unspecified: Secondary | ICD-10-CM

## 2018-11-12 DIAGNOSIS — C069 Malignant neoplasm of mouth, unspecified: Secondary | ICD-10-CM | POA: Diagnosis not present

## 2018-11-12 DIAGNOSIS — H6123 Impacted cerumen, bilateral: Secondary | ICD-10-CM | POA: Diagnosis not present

## 2018-11-12 DIAGNOSIS — K1321 Leukoplakia of oral mucosa, including tongue: Secondary | ICD-10-CM | POA: Diagnosis not present

## 2018-12-19 DIAGNOSIS — M5416 Radiculopathy, lumbar region: Secondary | ICD-10-CM | POA: Diagnosis not present

## 2018-12-19 DIAGNOSIS — M5136 Other intervertebral disc degeneration, lumbar region: Secondary | ICD-10-CM | POA: Diagnosis not present

## 2018-12-19 DIAGNOSIS — M47816 Spondylosis without myelopathy or radiculopathy, lumbar region: Secondary | ICD-10-CM | POA: Diagnosis not present

## 2018-12-19 DIAGNOSIS — M62830 Muscle spasm of back: Secondary | ICD-10-CM | POA: Diagnosis not present

## 2018-12-19 DIAGNOSIS — M4306 Spondylolysis, lumbar region: Secondary | ICD-10-CM | POA: Diagnosis not present

## 2019-01-02 DIAGNOSIS — H6063 Unspecified chronic otitis externa, bilateral: Secondary | ICD-10-CM | POA: Diagnosis not present

## 2019-01-02 DIAGNOSIS — H6123 Impacted cerumen, bilateral: Secondary | ICD-10-CM | POA: Diagnosis not present

## 2019-01-02 DIAGNOSIS — K1321 Leukoplakia of oral mucosa, including tongue: Secondary | ICD-10-CM | POA: Diagnosis not present

## 2019-02-11 DIAGNOSIS — Z85819 Personal history of malignant neoplasm of unspecified site of lip, oral cavity, and pharynx: Secondary | ICD-10-CM | POA: Insufficient documentation

## 2019-02-18 ENCOUNTER — Other Ambulatory Visit: Payer: Self-pay

## 2019-02-18 ENCOUNTER — Ambulatory Visit (INDEPENDENT_AMBULATORY_CARE_PROVIDER_SITE_OTHER): Payer: Medicare Other | Admitting: Family Medicine

## 2019-02-18 ENCOUNTER — Encounter: Payer: Self-pay | Admitting: Family Medicine

## 2019-02-18 VITALS — BP 124/78 | HR 73 | Temp 97.6°F | Resp 14 | Ht 65.0 in | Wt 161.6 lb

## 2019-02-18 DIAGNOSIS — Z0000 Encounter for general adult medical examination without abnormal findings: Secondary | ICD-10-CM

## 2019-02-18 DIAGNOSIS — R195 Other fecal abnormalities: Secondary | ICD-10-CM | POA: Diagnosis not present

## 2019-02-18 DIAGNOSIS — I10 Essential (primary) hypertension: Secondary | ICD-10-CM | POA: Diagnosis not present

## 2019-02-18 DIAGNOSIS — Z125 Encounter for screening for malignant neoplasm of prostate: Secondary | ICD-10-CM

## 2019-02-18 DIAGNOSIS — E7800 Pure hypercholesterolemia, unspecified: Secondary | ICD-10-CM

## 2019-02-18 DIAGNOSIS — Z8719 Personal history of other diseases of the digestive system: Secondary | ICD-10-CM

## 2019-02-18 MED ORDER — PANTOPRAZOLE SODIUM 40 MG PO TBEC: 40.0000 mg | DELAYED_RELEASE_TABLET | Freq: Every day | ORAL | 1 refills | Status: DC

## 2019-02-18 NOTE — Progress Notes (Signed)
Name: Michael Stevens   MRN: [id number]    DOB: Jan 20, 1941   Date:02/18/2019       Progress Note  Subjective  Chief Complaint  Chief Complaint  Patient presents with  . Annual Exam    HPI  Patient presents for annual CPE USPSTF grade A and B recommendations:  Diet: He has an adequate diet and limits his white potatoes, his wife grills and bakes regularly. Fresh fruits and vegetables consumed daily. Does not drink sodas, but enjoys black coffee and an occasional beer. Follows a DASH diet.   Exercise: Walking has become more difficult since his back surgeries hx of spondylosis of the lumbar region but attempts to move around. He does not keep track of steps, he will go outside occassionally.   Depression: phq 9 is positive and negative Depression screen [facility] 2/9 02/18/2019 04/05/2018 01/03/2018 10/02/2017 06/13/2017  Decreased Interest 0 0 0 0 0  Down, Depressed, Hopeless 0 0 0 0 0  PHQ - 2 Score 0 0 0 0 0  Altered sleeping 0 - - - -  Tired, decreased energy 0 - - - -  Change in appetite 0 - - - -  Feeling bad or failure about yourself  0 - - - -  Trouble concentrating 0 - - - -  Moving slowly or fidgety/restless 0 - - - -  Suicidal thoughts 0 - - - -  PHQ-9 Score 0 - - - -  Difficult doing work/chores Not difficult at all - - - -    Hypertension:  BP Readings from Last 3 Encounters:  02/18/19 124/78  04/05/18 128/82  01/03/18 126/78    Obesity: Wt Readings from Last 3 Encounters:  02/18/19 73.3 kg  04/05/18 73.9 kg  01/03/18 72.4 kg   BMI Readings from Last 3 Encounters:  02/18/19 26.89 kg/m  04/05/18 27.11 kg/m  01/03/18 26.58 kg/m     Lipids:  Lab Results  Component Value Date   CHOL 207 (H) 04/02/2018   CHOL 142 10/02/2017   CHOL 210 (H) 06/13/2017   Lab Results  Component Value Date   HDL 50 04/02/2018   HDL 48 10/02/2017   HDL 39 (L) 06/13/2017   Lab Results  Component Value Date   LDLCALC 137 (H) 04/02/2018   LDLCALC 77 10/02/2017   LDLCALC 151  (H) 06/13/2017   Lab Results  Component Value Date   TRIG 96 04/02/2018   TRIG 83 10/02/2017   TRIG 95 06/13/2017   Lab Results  Component Value Date   CHOLHDL 4.1 04/02/2018   CHOLHDL 3.0 10/02/2017   CHOLHDL 5.4 (H) 06/13/2017   No results found for: LDLDIRECT Glucose:  Glucose, Bld  Date Value Ref Range Status  04/02/2018 100 (H) 65 - 99 mg/dL Final    Comment:    .            Fasting reference interval . For someone without known diabetes, a glucose value between 100 and 125 mg/dL is consistent with prediabetes and should be confirmed with a follow-up test. .   10/02/2017 97 65 - 139 mg/dL Final    Comment:    .        Non-fasting reference interval .   07/03/2017 124 65 - 139 mg/dL Final    Comment:    .        Non-fasting reference interval .    Glucose-Capillary  Date Value Ref Range Status  06/23/2017 109 (H) 65 - 99 mg/dL Final  06/23/2017 90 65 - 99 mg/dL Final  06/23/2017 119 (H) 65 - 99 mg/dL Final      [facility] Visit from 02/18/2019 in [facility]  AUDIT-C Score  0       Married STD testing and prevention (HIV/chl/gon/syphilis): of no concern today  Hep C: testing decline   Skin cancer: has no concerns and denies changes in his skin. Uses sunscreen and sees a dermatologist yearly.  Colorectal cancer: Upper Endoscopy completed 06/2017 from GI bleed r/t duodenal ulcer Prostate cancer: No concerns last PSA  Lab Results  Component Value Date   PSA 0.6 06/21/2016    IPSS Questionnaire (AUA-7): Over the past month.   1)  How often have you had a sensation of not emptying your bladder completely after you finish urinating?  2 - Less than half the time  2)  How often have you had to urinate again less than two hours after you finished urinating? 0 - Not at all  3)  How often have you found you stopped and started again several times when you urinated?  1 - Less than 1 time in 5  4) How difficult have you found it to postpone  urination?  0 - Not at all  5) How often have you had a weak urinary stream?  1 - Less than 1 time in 5  6) How often have you had to push or strain to begin urination?  0 - Not at all  7) How many times did you most typically get up to urinate from the time you went to bed until the time you got up in the morning?  3 - 3 times  Total score:  0-7 mildly symptomatic   8-19 moderately symptomatic   20-35 severely symptomatic  Score of 7.   Lung cancer:  Low Dose CT Chest recommended if Age 24-80 years, 30 pack-year currently smoking OR have quit w/in 15years. Patient does not qualify.   AAA:  The USPSTF recommends one-time screening with ultrasonography in men ages 46 to 28 years who have ever smoked ECG:  On file; denies chest pain, shortness of breath, or palpitations.  Advanced Care Planning: A voluntary discussion about advance care planning including the explanation and discussion of advance directives.  Discussed health care proxy and Living will, and the patient was able to identify a health care proxy as [place] Sieg.  Patient does have a living will at present time. If patient does have living will, I have requested they bring this to the clinic to be scanned in to their chart.  Patient Active Problem List   Diagnosis Date Noted  . At risk for falling 04/05/2018  . Screening for gout 04/05/2018  . 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 Tremor 11/27/2017  . Leg pain, bilateral 11/07/2017  . Neuropathy 11/07/2017  . Duodenal ulcer with hemorrhage 09/05/2017  . Abnormal EKG 01/31/2017  . Right bundle branch block 01/31/2017  . 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 Hyperglycemia 12/22/2015  . DDD (degenerative disc disease), lumbar 07/14/2015  . Internal hemorrhoids 02/11/2015  . Male erectile dysfunction, unspecified 02/11/2015  . Anemia 01/14/2015  . Allergic rhinitis 10/13/2014  . 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  Diverticulosis of intestine 10/13/2014  . Gastric catarrh 10/13/2014  . Gastroesophageal reflux disease 10/13/2014  . Glaucoma 10/13/2014  . Hypercholesteremia 10/13/2014  . Essential (primary) hypertension 10/13/2014  . Occult blood in stools 02/06/2014  .  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 BACK SURGERY  April 2011  . COLONOSCOPY    . ESOPHAGOGASTRODUODENOSCOPY N/A 06/22/2017   Procedure: ESOPHAGOGASTRODUODENOSCOPY (EGD);  Surgeon: Toledo, Benay Pike, MD;  Location: [facility] ENDOSCOPY;  Service: Gastroenterology;  Laterality: N/A;    Family History  Problem Relation Age of Onset  . Healthy Mother   . Healthy Brother     Social History   Socioeconomic History  . Marital status: Married    Spouse name: [name]  . Number of children: 2  . Years of education: Not on file  . Highest education level: High school graduate  Occupational History  . Occupation: Retired  Scientific laboratory technician  . Financial resource strain: Not hard at all  . Food insecurity    Worry: Never true    Inability: Never true  . Transportation needs    Medical: No    Non-medical: No  Tobacco Use  . Smoking status: Former Smoker    Packs/day: 2.00    Years: 30.00    Pack years: 60.00    Types: Cigarettes    Quit date: 12/01/1982    Years since quitting: 36.2  . Smokeless tobacco: Never Used  . Tobacco comment: Quit 34 years ago  Substance and Sexual Activity  . Alcohol use: Yes    Alcohol/week: 7.0 standard drinks    Types: 7 Glasses of wine per week    Comment: occasional  . Drug use: No  . Sexual activity: Not Currently  Lifestyle  . Physical activity    Days per week: 0 days    Minutes per session: 0 min  . Stress: Not at all  Relationships  . Social connections    Talks on phone: More than three times a week    Gets together: More than three times a week    Attends  religious service: More than 4 times per year    Active member of club or organization: No    Attends meetings of clubs or organizations: Never    Relationship status: Married  . Intimate partner violence    Fear of current or ex partner: No    Emotionally abused: No    Physically abused: No    Forced sexual activity: No  Other Topics Concern  . Not on file  Social History Narrative  . Not on file     Current Outpatient Medications:  .  atorvastatin (LIPITOR) 20 MG tablet, Take 1 tablet (20 mg total) by mouth daily at 6 PM. CALL OFFICE FOR APPOINTMENT, Disp: 14 tablet, Rfl: 0 .  cetirizine (ZYRTEC) 10 MG tablet, Take 10 mg by mouth daily., Disp: , Rfl:  .  donepezil (ARICEPT) 5 MG tablet, Take by mouth., Disp: , Rfl:  .  latanoprost (XALATAN) 0.005 % ophthalmic solution, Place 1 drop into both eyes at bedtime., Disp: 2.5 mL, Rfl:  .  Misc Natural Products (MENS PROSTATE HEALTH FORMULA PO), Take by mouth., Disp: , Rfl:  .  vitamin B-12 (CYANOCOBALAMIN) 1000 MCG tablet, Take 1,000 mcg by mouth daily., Disp: , Rfl:  .  pantoprazole (PROTONIX) 40 MG tablet, Take 1 tablet (40 mg total) by mouth 2 (two) times daily., Disp: 60 tablet, Rfl: 2  No Known Allergies   Review of Systems  Constitutional: Negative.   HENT: Negative.   Eyes: Negative.   Respiratory: Negative.  Negative for  cough and shortness of breath.   Cardiovascular: Negative.  Negative for chest pain and palpitations.  Gastrointestinal: Negative.  Negative for abdominal pain, blood in stool and constipation.  Genitourinary: Positive for frequency. Negative for hematuria.  Musculoskeletal: Positive for back pain.  Skin: Negative.   Neurological: Negative.   Endo/Heme/Allergies: Negative.   Psychiatric/Behavioral: Negative.   All other systems reviewed and are negative.      Objective  Vitals:   02/18/19 1257  BP: 124/78  Pulse: 73  Resp: 14  Temp: 97.6 F (36.4 C)  TempSrc: Temporal  SpO2: 93%  Weight:  73.3 kg  Height: 5\' 5"  (1.651 m)    Body mass index is 26.89 kg/m.  Physical Exam Vitals signs reviewed.  Constitutional:      General: He is not in acute distress.    Appearance: He is normal weight.  HENT:     Head: Normocephalic and atraumatic.     Nose: Nose normal. No congestion.     Mouth/Throat:     Mouth: Mucous membranes are moist.     Pharynx: Oropharynx is clear.  Eyes:     Extraocular Movements: Extraocular movements intact.     Pupils: Pupils are equal, round, and reactive to light.  Neck:     Musculoskeletal: Normal range of motion and neck supple. No muscular tenderness.  Cardiovascular:     Rate and Rhythm: Normal rate and regular rhythm.     Pulses: Normal pulses.     Heart sounds: Normal heart sounds. No murmur. No friction rub. No gallop.   Pulmonary:     Effort: Pulmonary effort is normal. No respiratory distress.     Breath sounds: Normal breath sounds. No stridor. No wheezing or rhonchi.  Abdominal:     General: Abdomen is flat. There is no distension.     Palpations: Abdomen is soft. There is no mass.     Tenderness: There is no abdominal tenderness. There is no guarding.     Hernia: No hernia is present.  Musculoskeletal: Normal range of motion.  Skin:    General: Skin is warm and dry.     Findings: No erythema.  Neurological:     General: No focal deficit present.     Mental Status: He is alert and oriented to person, place, and time. Mental status is at baseline.     Cranial Nerves: No cranial nerve deficit.     Motor: No weakness.     Gait: Gait normal.  Psychiatric:        Mood and Affect: Mood normal.        Thought Content: Thought content normal.        Judgment: Judgment normal.     No results found for this or any previous visit (from the past 2160 hour(s)).   PHQ2/9: Depression screen [facility] 2/9 02/18/2019 04/05/2018 01/03/2018 10/02/2017 06/13/2017  Decreased Interest 0 0 0 0 0  Down, Depressed, Hopeless 0 0 0 0 0  PHQ - 2 Score 0  0 0 0 0  Altered sleeping 0 - - - -  Tired, decreased energy 0 - - - -  Change in appetite 0 - - - -  Feeling bad or failure about yourself  0 - - - -  Trouble concentrating 0 - - - -  Moving slowly or fidgety/restless 0 - - - -  Suicidal thoughts 0 - - - -  PHQ-9 Score 0 - - - -  Difficult doing work/chores Not difficult at  all - - - -   Fall Risk: Fall Risk  02/18/2019 04/05/2018 01/03/2018 10/02/2017 06/13/2017  Falls in the past year? 0 0 No No No  Number falls in past yr: 0 0 - - -  Injury with Fall? 0 - - - -  Risk for fall due to : - - - - -  Risk for fall due to: Comment - - - - -  Follow up Falls evaluation completed - - - -     Assessment & Plan 1. Annual physical exam -Prostate cancer screening and PSA options (with potential risks and benefits of testing vs not testing) were discussed along with recent recs/guidelines. -USPSTF grade A and B recommendations reviewed with patient; age-appropriate recommendations, preventive care, screening tests, etc discussed and encouraged; healthy living encouraged; see AVS for patient education given to patient -Discussed importance of 150 minutes of physical activity weekly, eat two servings of fish weekly, eat one serving of tree nuts ( cashews, pistachios, pecans, almonds.Marland Kitchen) every other day, eat 6 servings of fruit/vegetables daily and drink plenty of water and avoid sweet beverages.  - PSA  2. Essential (primary) hypertension - COMPLETE METABOLIC PANEL WITH GFR  3. Hypercholesteremia - Lipid panel  4. Occult blood in stools - No issues in over a year; monitor CBC and ferritin per labs - CBC with Differential/Platelet - Iron, TIBC and Ferritin Panel - pantoprazole (PROTONIX) 40 MG tablet; Take 1 tablet (40 mg total) by mouth daily.  Dispense: 90 tablet; Refill: 1  5. History of GI bleed - pantoprazole (PROTONIX) 40 MG tablet; Take 1 tablet (40 mg total) by mouth daily.  Dispense: 90 tablet; Refill: 1  6. Prostate cancer  screening - PSA

## 2019-02-19 ENCOUNTER — Other Ambulatory Visit: Payer: Self-pay | Admitting: Family Medicine

## 2019-02-19 DIAGNOSIS — E7800 Pure hypercholesterolemia, unspecified: Secondary | ICD-10-CM

## 2019-02-19 LAB — COMPLETE METABOLIC PANEL WITH GFR
AG Ratio: 1.8 (calc) (ref 1.0–2.5)
ALT: 8 U/L — ABNORMAL LOW (ref 9–46)
AST: 16 U/L (ref 10–35)
Albumin: 4.4 g/dL (ref 3.6–5.1)
Alkaline phosphatase (APISO): 54 U/L (ref 35–144)
BUN: 13 mg/dL (ref 7–25)
CO2: 23 mmol/L (ref 20–32)
Calcium: 9.4 mg/dL (ref 8.6–10.3)
Chloride: 106 mmol/L (ref 98–110)
Creat: 0.99 mg/dL (ref 0.70–1.18)
GFR, Est African American: 84 mL/min/{1.73_m2} (ref >=60)
GFR, Est Non African American: 73 mL/min/{1.73_m2} (ref >=60)
Globulin: 2.4 g/dL (calc) (ref 1.9–3.7)
Glucose, Bld: 87 mg/dL (ref 65–99)
Potassium: 4.1 mmol/L (ref 3.5–5.3)
Sodium: 140 mmol/L (ref 135–146)
Total Bilirubin: 0.7 mg/dL (ref 0.2–1.2)
Total Protein: 6.8 g/dL (ref 6.1–8.1)

## 2019-02-19 LAB — CBC WITH DIFFERENTIAL/PLATELET
Absolute Monocytes: 968 cells/uL — ABNORMAL HIGH (ref 200–950)
Basophils Absolute: 113 cells/uL (ref 0–200)
Basophils Relative: 1.2 %
Eosinophils Absolute: 216 cells/uL (ref 15–500)
Eosinophils Relative: 2.3 %
HCT: 43.1 % (ref 38.5–50.0)
Hemoglobin: 14.7 g/dL (ref 13.2–17.1)
Lymphs Abs: 2698 cells/uL (ref 850–3900)
MCH: 31.8 pg (ref 27.0–33.0)
MCHC: 34.1 g/dL (ref 32.0–36.0)
MCV: 93.3 fL (ref 80.0–100.0)
MPV: 10.1 fL (ref 7.5–12.5)
Monocytes Relative: 10.3 %
Neutro Abs: 5405 cells/uL (ref 1500–7800)
Neutrophils Relative %: 57.5 %
Platelets: 172 10*3/uL (ref 140–400)
RBC: 4.62 10*6/uL (ref 4.20–5.80)
RDW: 16.7 % — ABNORMAL HIGH (ref 11.0–15.0)
Total Lymphocyte: 28.7 %
WBC: 9.4 10*3/uL (ref 3.8–10.8)

## 2019-02-19 LAB — LIPID PANEL
Cholesterol: 215 mg/dL — ABNORMAL HIGH (ref <200)
HDL: 51 mg/dL (ref >=40)
LDL Cholesterol (Calc): 141 mg/dL (calc) — ABNORMAL HIGH
Non-HDL Cholesterol (Calc): 164 mg/dL (calc) — ABNORMAL HIGH (ref <130)
Total CHOL/HDL Ratio: 4.2 (calc) (ref <5.0)
Triglycerides: 109 mg/dL (ref <150)

## 2019-02-19 LAB — IRON,TIBC AND FERRITIN PANEL
%SAT: 25 % (calc) (ref 20–48)
Ferritin: 39 ng/mL (ref 24–380)
Iron: 89 ug/dL (ref 50–180)
TIBC: 353 mcg/dL (calc) (ref 250–425)

## 2019-02-19 LAB — PSA: PSA: 0.8 ng/mL (ref <=4.0)

## 2019-02-19 MED ORDER — ATORVASTATIN CALCIUM 40 MG PO TABS: 40.0000 mg | ORAL_TABLET | Freq: Every day | ORAL | 3 refills | Status: DC

## 2019-02-27 DIAGNOSIS — R413 Other amnesia: Secondary | ICD-10-CM | POA: Insufficient documentation

## 2019-02-27 DIAGNOSIS — E538 Deficiency of other specified B group vitamins: Secondary | ICD-10-CM | POA: Insufficient documentation

## 2019-04-16 ENCOUNTER — Other Ambulatory Visit: Payer: Self-pay

## 2019-04-16 ENCOUNTER — Ambulatory Visit (INDEPENDENT_AMBULATORY_CARE_PROVIDER_SITE_OTHER): Payer: Medicare Other

## 2019-04-16 VITALS — BP 130/72 | HR 72 | Temp 96.9°F | Resp 16 | Ht 65.0 in | Wt 164.5 lb

## 2019-04-16 DIAGNOSIS — Z0000 Encounter for general adult medical examination without abnormal findings: Secondary | ICD-10-CM | POA: Diagnosis not present

## 2019-04-16 NOTE — Addendum Note (Signed)
Addended by: Clemetine Marker D on: 04/16/2019 03:20 PM   Modules accepted: Level of Service, SmartSet

## 2019-04-16 NOTE — Progress Notes (Signed)
Subjective:   Michael Stevens is a 78 y.o. male who presents for Medicare Annual/Subsequent preventive examination.  Review of Systems:   Cardiac Risk Factors include: advanced age (>8men, >53 women);dyslipidemia;male gender     Objective:    Vitals: BP 130/72 (BP Location: Right Arm, Patient Position: Sitting, Cuff Size: Normal)   Pulse 72   Temp (!) 96.9 F (36.1 C) (Temporal)   Resp 16   Ht 5\' 5"  (1.651 m)   Wt 164 lb 8 oz (74.6 kg)   SpO2 97%   BMI 27.37 kg/m   Body mass index is 27.37 kg/m.  Advanced Directives 04/16/2019 04/05/2018 06/22/2017 06/21/2017 06/21/2017 03/24/2017 10/04/2016  Does Patient Have a Medical Advance Directive? Yes Yes Yes Yes Yes No No  Type of Paramedic of [place];Living will Living will;Healthcare Power of Attorney Living will Living will Living will - -  Does patient want to make changes to medical advance directive? - No - Patient declined - No - Patient declined No - Patient declined - -  Copy of [place] in Chart? No - copy requested No - copy requested No - copy requested - - - -  Would patient like information on creating a medical advance directive? - - - - - Yes (MAU/Ambulatory/Procedural Areas - Information given) -  Pre-existing out of facility DNR order (yellow form or pink MOST form) - - - - - - -    Tobacco Social History   Tobacco Use  Smoking Status Former Smoker  . Packs/day: 2.00  . Years: 30.00  . Pack years: 60.00  . Types: Cigarettes  . Quit date: 12/01/1982  . Years since quitting: 36.3  Smokeless Tobacco Never Used  Tobacco Comment   Quit 34 years ago     Counseling given: Not Answered Comment: Quit 34 years ago   Clinical Intake:  Pre-visit preparation completed: Yes  Pain : 0-10 Pain Score: 5  Pain Type: Neuropathic pain Pain Location: Leg(back pain) Pain Orientation: Right, Left Pain Descriptors / Indicators: Aching, Discomfort, Tingling Pain Onset: More than a  month ago Pain Frequency: Constant     BMI - recorded: 27.37 Nutritional Status: BMI 25 -29 Overweight Nutritional Risks: None Diabetes: No  How often do you need to have someone help you when you read instructions, pamphlets, or other written materials from your doctor or pharmacy?: 1 - Never  Interpreter Needed?: No  Information entered by :: Michael Marker LPN  Past Medical History:  Diagnosis Date  . Allergic rhinitis   . Anemia   . Arthritis    Back  . Bladder neck obstruction   . Dementia ([place])   . Duodenal ulcer with hemorrhage   . GERD (gastroesophageal reflux disease)    ocassional  . Glaucoma   . Hypercholesteremia   . Hypertension   . Impotence   . 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 Mixed hyperlipidemia   . 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 BACK SURGERY  April 2011  . COLONOSCOPY    . ESOPHAGOGASTRODUODENOSCOPY N/A 06/22/2017   Procedure: ESOPHAGOGASTRODUODENOSCOPY (EGD);  Surgeon: Toledo, Benay Pike, MD;  Location: [facility] ENDOSCOPY;  Service: Gastroenterology;  Laterality: N/A;   Family History  Problem Relation Age of Onset  . Healthy Mother   . Healthy Brother    Social History   Socioeconomic History  . Marital status: Married  Spouse name: Michael Stevens  . Number of children: 2  . Years of education: Not on file  . Highest education level: High school graduate  Occupational History  . Occupation: Retired  Tobacco Use  . Smoking status: Former Smoker    Packs/day: 2.00    Years: 30.00    Pack years: 60.00    Types: Cigarettes    Quit date: 12/01/1982    Years since quitting: 36.3  . Smokeless tobacco: Never Used  . Tobacco comment: Quit 34 years ago  Substance and Sexual Activity  . Alcohol use: Yes    Alcohol/week: 7.0 standard drinks    Types: 7 Glasses of wine per week    Comment: occasional  . Drug use: No  . Sexual activity: Not Currently  Other  Topics Concern  . Not on file  Social History Narrative  . Not on file   Social Determinants of Health   Financial Resource Strain:   . Difficulty of Paying Living Expenses: Not on file  Food Insecurity:   . Worried About Charity fundraiser in the Last Year: Not on file  . Ran Out of Food in the Last Year: Not on file  Transportation Needs:   . Lack of Transportation (Medical): Not on file  . Lack of Transportation (Non-Medical): Not on file  Physical Activity:   . Days of Exercise per Week: Not on file  . Minutes of Exercise per Session: Not on file  Stress:   . Feeling of Stress : Not on file  Social Connections:   . Frequency of Communication with Friends and Family: Not on file  . Frequency of Social Gatherings with Friends and Family: Not on file  . Attends Religious Services: Not on file  . Active Member of Clubs or Organizations: Not on file  . Attends Archivist Meetings: Not on file  . Marital Status: Not on file    Outpatient Encounter Medications as of 04/16/2019  Medication Sig  . atorvastatin (LIPITOR) 40 MG tablet Take 1 tablet (40 mg total) by mouth daily.  Marland Kitchen donepezil (ARICEPT) 10 MG tablet Take 1 tablet by mouth at bedtime.  Marland Kitchen guaiFENesin (MUCINEX) 600 MG 12 hr tablet Take 600 mg by mouth 2 (two) times daily as needed.  . latanoprost (XALATAN) 0.005 % ophthalmic solution Place 1 drop into both eyes at bedtime.  . pantoprazole (PROTONIX) 40 MG tablet Take 1 tablet (40 mg total) by mouth daily.  . vitamin B-12 (CYANOCOBALAMIN) 1000 MCG tablet Take 1,000 mcg by mouth daily.  . [DISCONTINUED] cetirizine (ZYRTEC) 10 MG tablet Take 10 mg by mouth daily.  . [DISCONTINUED] donepezil (ARICEPT) 5 MG tablet Take by mouth.  . [DISCONTINUED] Misc Natural Products (MENS PROSTATE HEALTH FORMULA PO) Take by mouth.   No facility-administered encounter medications on file as of 04/16/2019.    Activities of Daily Living In your present state of health, do you  have any difficulty performing the following activities: 04/16/2019  Hearing? N  Comment declines hearing aids  Vision? N  Difficulty concentrating or making decisions? Y  Walking or climbing stairs? Y  Dressing or bathing? N  Doing errands, shopping? Y  Preparing Food and eating ? N  Using the Toilet? N  In the past six months, have you accidently leaked urine? N  Do you have problems with loss of bowel control? N  Managing your Medications? Y  Comment managed by wife  Managing your Finances? Y  Housekeeping or managing your Housekeeping?  N  Some recent data might be hidden    Patient Care Team: Hubbard Hartshorn, FNP as PCP - General (Family Medicine) Samara Deist, DPM as Referring Physician (Podiatry) Loletta Parish, MD as Referring Physician (Hematology and Oncology) Meeler, Sherren Kerns, FNP (Family Medicine) Efrain Sella, MD as Consulting Physician (Gastroenterology) Anabel Bene, MD as Referring Physician (Neurology)   Assessment:   This is a routine wellness examination for Michael Stevens.  Exercise Activities and Dietary recommendations Current Exercise Habits: The patient does not participate in regular exercise at present, Exercise limited by: neurologic condition(s)  Goals    . Increase physical activity     Recommend increasing physical activity to at least 90 minutes per week.     . Prevent falls     Recommend to remove items from home that may cause slips or trips.    . Reduce sugar intake      Continue to decrease sugar intake daily.        Fall Risk Fall Risk  04/16/2019 02/18/2019 04/05/2018 01/03/2018 10/02/2017  Falls in the past year? 0 0 0 No No  Number falls in past yr: 0 0 0 - -  Injury with Fall? 0 0 - - -  Risk for fall due to : Impaired balance/gait - - - -  Risk for fall due to: Comment - - - - -  Follow up Falls prevention discussed Falls evaluation completed - - -   FALL RISK PREVENTION PERTAINING TO THE HOME:  Any stairs in or around  the home? Yes  If so, do they handrails? Yes   Home free of loose throw rugs in walkways, pet beds, electrical cords, etc? Yes  Adequate lighting in your home to reduce risk of falls? Yes   ASSISTIVE DEVICES UTILIZED TO PREVENT FALLS:  Life alert? No  Use of a cane, walker or w/c? No  Grab bars in the bathroom? Yes  Shower chair or bench in shower? Yes  Elevated toilet seat or a handicapped toilet? No   DME ORDERS:  DME order needed?  No   TIMED UP AND GO:  Was the test performed? Yes .  Length of time to ambulate 10 feet: 7 sec.   GAIT:  Appearance of gait: Gait slow, steady and without the use of an assistive device. Pt was holding onto his wife.   Education: Fall risk prevention has been discussed.  Intervention(s) required? No   Depression Screen PHQ 2/9 Scores 04/16/2019 02/18/2019 04/05/2018 01/03/2018  PHQ - 2 Score 0 0 0 0  PHQ- 9 Score - 0 - -    Cognitive Function 6CIT deferred due to patient managed by Dr. Melrose Nakayama for dementia and currently taking aricept.      6CIT Screen 04/05/2018 03/24/2017 07/25/2016  What Year? 0 points 4 points 0 points  What month? 0 points 0 points 0 points  What time? 0 points 0 points 0 points  Count back from 20 0 points 0 points 0 points  Months in reverse 0 points 0 points 2 points  Repeat phrase 6 points 6 points 2 points  Total Score 6 10 4     Immunization History  Administered Date(s) Administered  . Influenza Inj Mdck Quad Pf 01/18/2019  . Influenza, High Dose Seasonal PF 01/03/2018  . Influenza-Unspecified 01/17/2015, 03/01/2016  . Pneumococcal Conjugate-13 03/24/2017  . Pneumococcal Polysaccharide-23 04/18/2010    Qualifies for Shingles Vaccine? Yes  . Due for Shingrix. Education has been provided regarding the importance of  this vaccine. Pt has been advised to call insurance company to determine out of pocket expense. Advised may also receive vaccine at local pharmacy or Health Dept. Verbalized acceptance and  understanding.  Tdap: Although this vaccine is not a covered service during a Wellness Exam, does the patient still wish to receive this vaccine today?  No .  Education has been provided regarding the importance of this vaccine. Advised may receive this vaccine at local pharmacy or Health Dept. Aware to provide a copy of the vaccination record if obtained from local pharmacy or Health Dept. Verbalized acceptance and understanding.  Flu Vaccine: Up to date  Pneumococcal Vaccine: Up to date   Screening Tests Health Maintenance  Topic Date Due  . TETANUS/TDAP  04/18/2026 (Originally 01/11/1960)  . INFLUENZA VACCINE  Completed  . PNA vac Low Risk Adult  Completed   Cancer Screenings:  Colorectal Screening:  No longer required.   Lung Cancer Screening: (Low Dose CT Chest recommended if Age 23-80 years, 30 pack-year currently smoking OR have quit w/in 15years.) does not qualify.    Additional Screening:  Hepatitis C Screening: does not qualify;   Vision Screening: Recommended annual ophthalmology exams for early detection of glaucoma and other disorders of the eye. Is the patient up to date with their annual eye exam?  Yes  Who is the provider or what is the name of the office in which the pt attends annual eye exams? [place] Screening: Recommended annual dental exams for proper oral hygiene  Community Resource Referral:  CRR required this visit?  No       Plan:    I have personally reviewed and addressed the Medicare Annual Wellness questionnaire and have noted the following in the patient's chart:  A. Medical and social history B. Use of alcohol, tobacco or illicit drugs  C. Current medications and supplements D. Functional ability and status E.  Nutritional status F.  Physical activity G. Advance directives H. List of other physicians I.  Hospitalizations, surgeries, and ER visits in previous 12 months J.  [place] such as hearing and  vision if needed, cognitive and depression L. Referrals and appointments   In addition, I have reviewed and discussed with patient certain preventive protocols, quality metrics, and best practice recommendations. A written personalized care plan for preventive services as well as general preventive health recommendations were provided to patient.   Signed,  Michael Marker, LPN Nurse Health Advisor   Nurse Notes: pt accompanied to visit by his wife. He c/o pain in his legs and walking slowly but declines use of assistive device other than holding onto her at times. He also c/o slight cough and congestion but denied any other symptoms of Covid-19. Pt advised to contact us if sxs worsen or persist.

## 2019-04-16 NOTE — Patient Instructions (Signed)
Michael Stevens , Thank you for taking time to come for your Medicare Wellness Visit. I appreciate your ongoing commitment to your health goals. Please review the following plan we discussed and let me know if I can assist you in the future.   Screening recommendations/referrals: Colonoscopy: no longer required Recommended yearly ophthalmology/optometry visit for glaucoma screening and checkup Recommended yearly dental visit for hygiene and checkup  Vaccinations: Influenza vaccine: done 01/18/19 Pneumococcal vaccine: done 03/24/17 Tdap vaccine: due - please contact us if you get a cut or scrape on rust or metal Shingles vaccine: Shingrix discussed. Please contact your pharmacy for coverage information.   Advanced directives: Please bring a copy of your health care power of attorney and living will to the office at your convenience.  Conditions/risks identified: Recommend drinking 6-8 glasses of water per day  Next appointment: Please follow up in one year for your Medicare Annual Wellness visit.    Preventive Care 78 Years and Older, Male Preventive care refers to lifestyle choices and visits with your health care provider that can promote health and wellness. What does preventive care include?  A yearly physical exam. This is also called an annual well check.  Dental exams once or twice a year.  Routine eye exams. Ask your health care provider how often you should have your eyes checked.  Personal lifestyle choices, including:  Daily care of your teeth and gums.  Regular physical activity.  Eating a healthy diet.  Avoiding tobacco and drug use.  Limiting alcohol use.  Practicing safe sex.  Taking low doses of aspirin every day.  Taking vitamin and mineral supplements as recommended by your health care provider. What happens during an annual well check? The services and screenings done by your health care provider during your annual well check will depend on your age,  overall health, lifestyle risk factors, and family history of disease. Counseling  Your health care provider may ask you questions about your:  Alcohol use.  Tobacco use.  Drug use.  Emotional well-being.  Home and relationship well-being.  Sexual activity.  Eating habits.  History of falls.  Memory and ability to understand (cognition).  Work and work Statistician. Screening  You may have the following tests or measurements:  Height, weight, and BMI.  Blood pressure.  Lipid and cholesterol levels. These may be checked every 5 years, or more frequently if you are over 78 years old.  Skin check.  Lung cancer screening. You may have this screening every year starting at age 78 if you have a 30-pack-year history of smoking and currently smoke or have quit within the past 15 years.  Fecal occult blood test (FOBT) of the stool. You may have this test every year starting at age 78.  Flexible sigmoidoscopy or colonoscopy. You may have a sigmoidoscopy every 5 years or a colonoscopy every 10 years starting at age 78.  Prostate cancer screening. Recommendations will vary depending on your family history and other risks.  Hepatitis C blood test.  Hepatitis B blood test.  Sexually transmitted disease (STD) testing.  Diabetes screening. This is done by checking your blood sugar (glucose) after you have not eaten for a while (fasting). You may have this done every 1-3 years.  Abdominal aortic aneurysm (AAA) screening. You may need this if you are a current or former smoker.  Osteoporosis. You may be screened starting at age 78 if you are at high risk. Talk with your health care provider about your test results, treatment  options, and if necessary, the need for more tests. Vaccines  Your health care provider may recommend certain vaccines, such as:  Influenza vaccine. This is recommended every year.  Tetanus, diphtheria, and acellular pertussis (Tdap, Td) vaccine. You may  need a Td booster every 10 years.  Zoster vaccine. You may need this after age 78.  Pneumococcal 13-valent conjugate (PCV13) vaccine. One dose is recommended after age 78.  Pneumococcal polysaccharide (PPSV23) vaccine. One dose is recommended after age 78. Talk to your health care provider about which screenings and vaccines you need and how often you need them. This information is not intended to replace advice given to you by your health care provider. Make sure you discuss any questions you have with your health care provider. Document Released: 05/01/2015 Document Revised: 12/23/2015 Document Reviewed: 02/03/2015 Elsevier Interactive Patient Education  2017 [employer] Prevention in the Home Falls can cause injuries. They can happen to people of all ages. There are many things you can do to make your home safe and to help prevent falls. What can I do on the outside of my home?  Regularly fix the edges of walkways and driveways and fix any cracks.  Remove anything that might make you trip as you walk through a door, such as a raised step or threshold.  Trim any bushes or trees on the path to your home.  Use bright outdoor lighting.  Clear any walking paths of anything that might make someone trip, such as rocks or tools.  Regularly check to see if handrails are loose or broken. Make sure that both sides of any steps have handrails.  Any raised decks and porches should have guardrails on the edges.  Have any leaves, snow, or ice cleared regularly.  Use sand or salt on walking paths during winter.  Clean up any spills in your garage right away. This includes oil or grease spills. What can I do in the bathroom?  Use night lights.  Install grab bars by the toilet and in the tub and shower. Do not use towel bars as grab bars.  Use non-skid mats or decals in the tub or shower.  If you need to sit down in the shower, use a plastic, non-slip stool.  Keep the floor  dry. Clean up any water that spills on the floor as soon as it happens.  Remove soap buildup in the tub or shower regularly.  Attach bath mats securely with double-sided non-slip rug tape.  Do not have throw rugs and other things on the floor that can make you trip. What can I do in the bedroom?  Use night lights.  Make sure that you have a light by your bed that is easy to reach.  Do not use any sheets or blankets that are too big for your bed. They should not hang down onto the floor.  Have a firm chair that has side arms. You can use this for support while you get dressed.  Do not have throw rugs and other things on the floor that can make you trip. What can I do in the kitchen?  Clean up any spills right away.  Avoid walking on wet floors.  Keep items that you use a lot in easy-to-reach places.  If you need to reach something above you, use a strong step stool that has a grab bar.  Keep electrical cords out of the way.  Do not use floor polish or wax that makes floors slippery.  If you must use wax, use non-skid floor wax.  Do not have throw rugs and other things on the floor that can make you trip. What can I do with my stairs?  Do not leave any items on the stairs.  Make sure that there are handrails on both sides of the stairs and use them. Fix handrails that are broken or loose. Make sure that handrails are as long as the stairways.  Check any carpeting to make sure that it is firmly attached to the stairs. Fix any carpet that is loose or worn.  Avoid having throw rugs at the top or bottom of the stairs. If you do have throw rugs, attach them to the floor with carpet tape.  Make sure that you have a light switch at the top of the stairs and the bottom of the stairs. If you do not have them, ask someone to add them for you. What else can I do to help prevent falls?  Wear shoes that:  Do not have high heels.  Have rubber bottoms.  Are comfortable and fit you  well.  Are closed at the toe. Do not wear sandals.  If you use a stepladder:  Make sure that it is fully opened. Do not climb a closed stepladder.  Make sure that both sides of the stepladder are locked into place.  Ask someone to hold it for you, if possible.  Clearly mark and make sure that you can see:  Any grab bars or handrails.  First and last steps.  Where the edge of each step is.  Use tools that help you move around (mobility aids) if they are needed. These include:  Canes.  Walkers.  Scooters.  Crutches.  Turn on the lights when you go into a dark area. Replace any light bulbs as soon as they burn out.  Set up your furniture so you have a clear path. Avoid moving your furniture around.  If any of your floors are uneven, fix them.  If there are any pets around you, be aware of where they are.  Review your medicines with your doctor. Some medicines can make you feel dizzy. This can increase your chance of falling. Ask your doctor what other things that you can do to help prevent falls. This information is not intended to replace advice given to you by your health care provider. Make sure you discuss any questions you have with your health care provider. Document Released: 01/29/2009 Document Revised: 09/10/2015 Document Reviewed: 05/09/2014 Elsevier Interactive Patient Education  2017 [employer].

## 2019-07-04 DIAGNOSIS — M6289 Other specified disorders of muscle: Secondary | ICD-10-CM | POA: Insufficient documentation

## 2019-08-15 ENCOUNTER — Other Ambulatory Visit: Payer: Self-pay | Admitting: Family Medicine

## 2019-08-15 DIAGNOSIS — Z8719 Personal history of other diseases of the digestive system: Secondary | ICD-10-CM

## 2019-08-15 DIAGNOSIS — R195 Other fecal abnormalities: Secondary | ICD-10-CM

## 2019-12-14 IMAGING — MR MR LUMBAR SPINE W/O CM
4 of 5 series · 24 of 48 positions shown · non-contrast
Comparison: Lumbar MRI 05/31/2017

CLINICAL DATA: Low back pain bilateral leg pain. Numbness and
tingling in both legs and feet

EXAM:
MRI LUMBAR SPINE WITHOUT CONTRAST
TECHNIQUE: Multiplanar, multisequence MR imaging of the lumbar spine was
performed. No intravenous contrast was administered.

[Series 3: T2 · sagittal · 4.0mm · 0.81mm/px · 6 of 15 slices shown (1 of 2)]
[im 1/15]
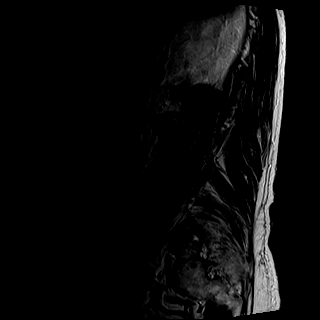
[im 3/15]
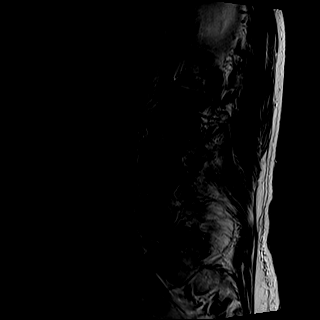
[im 6/15]
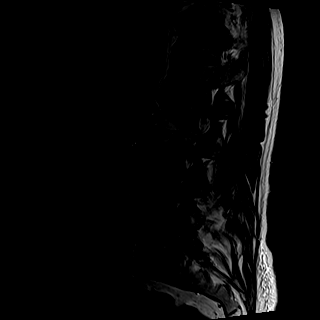
[im 9/15]
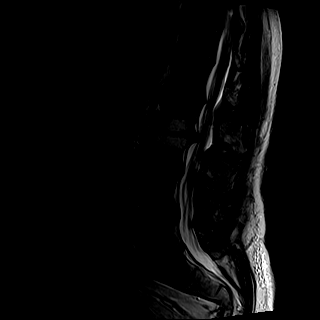
[im 12/15]
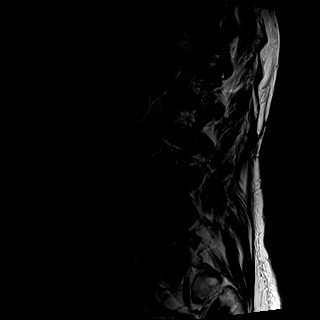
[im 15/15]
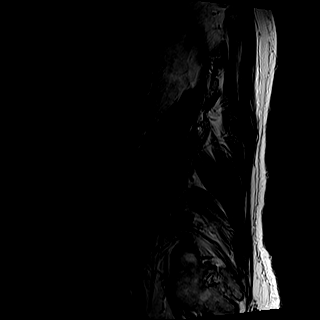

[Series 4: T1 · sagittal · 4.0mm · 0.41mm/px · 7 of 15 slices shown (1 of 2)]
[im 1/15]
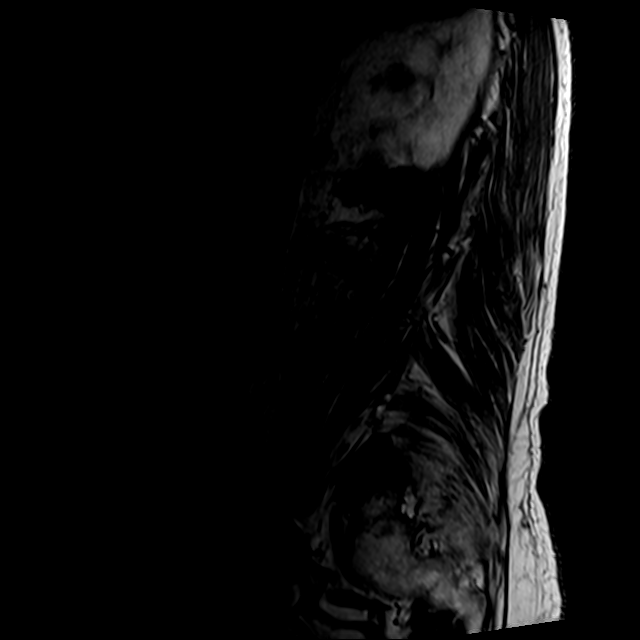
[im 3/15]
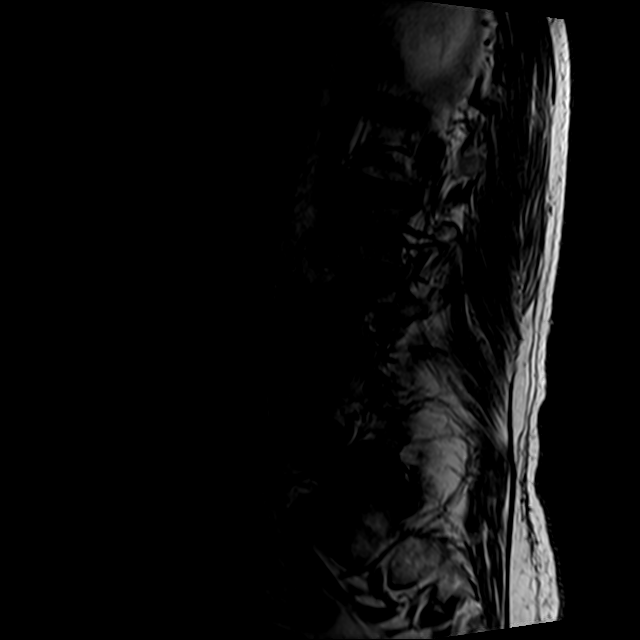
[im 5/15]
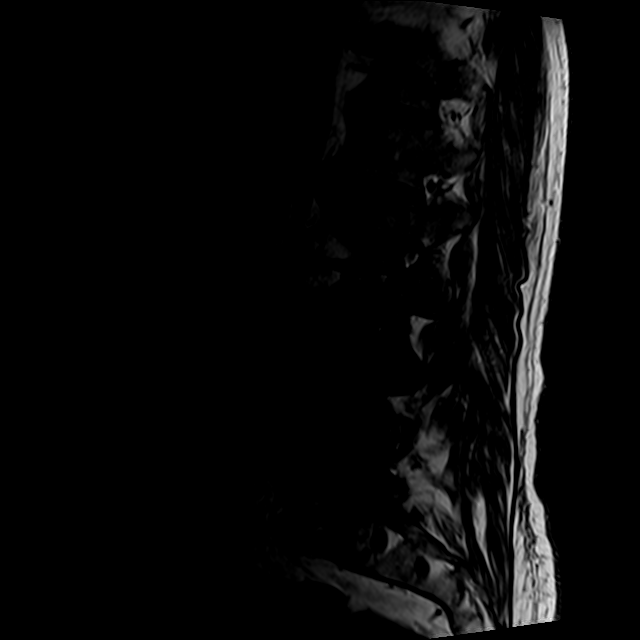
[im 8/15]
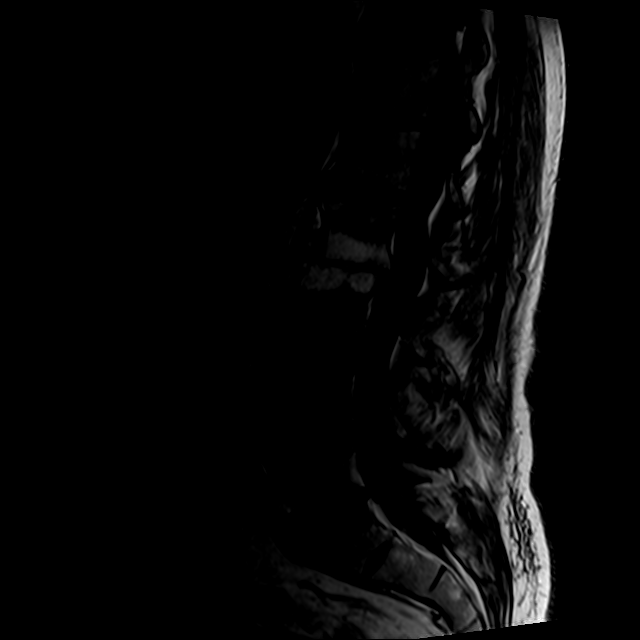
[im 10/15]
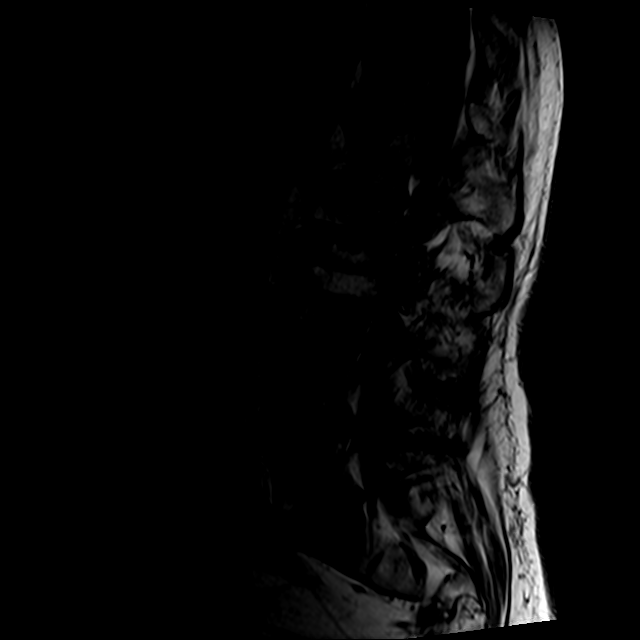
[im 12/15]
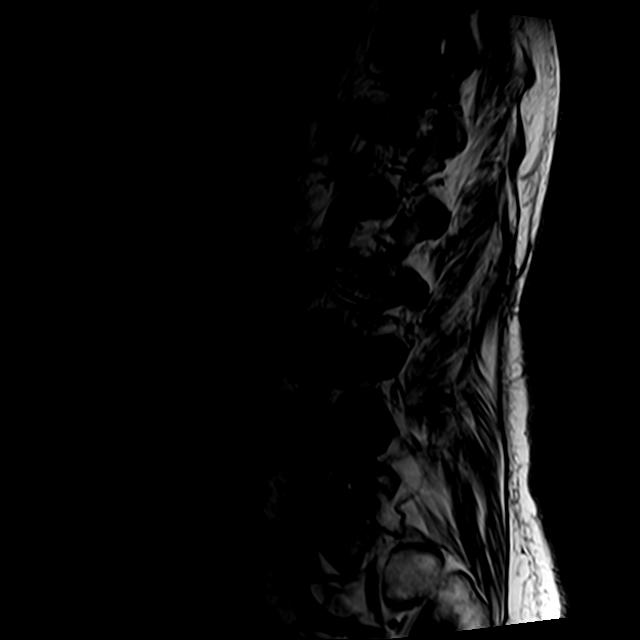
[im 15/15]
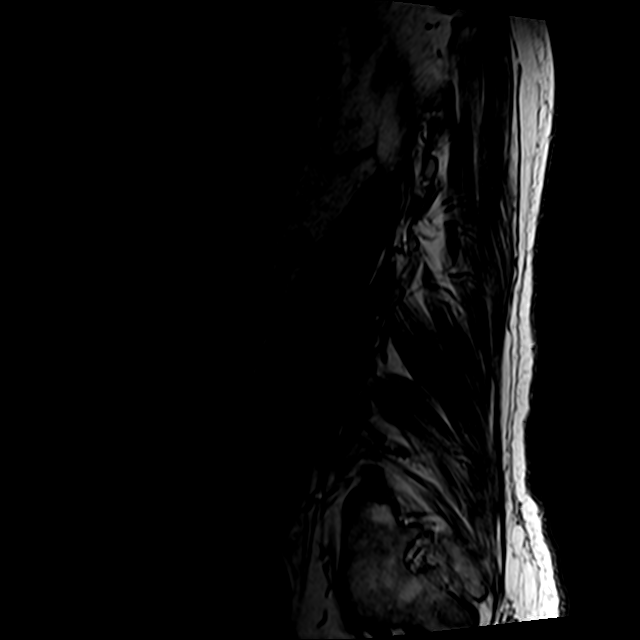

[Series 6: T2 · axial · 4.0mm · 0.78mm/px · z∈[-179,-2]mm · 8 of 31 slices shown (2 of 2)]
[im 1/31]
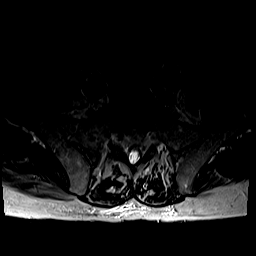
[im 5/31]
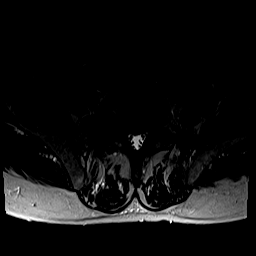
[im 10/31]
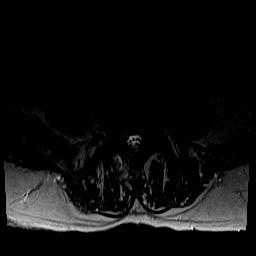
[im 14/31]
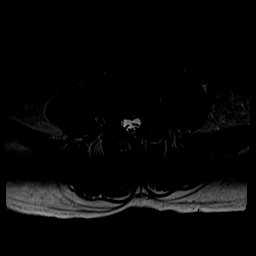
[im 17/31]
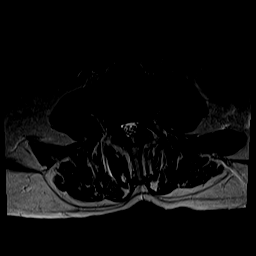
[im 21/31]
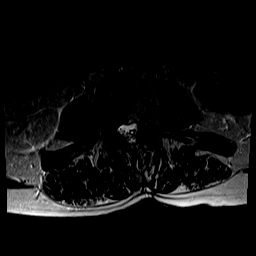
[im 26/31]
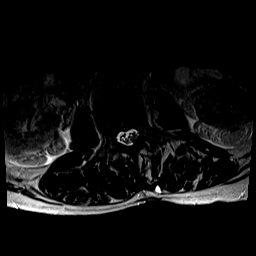
[im 31/31]
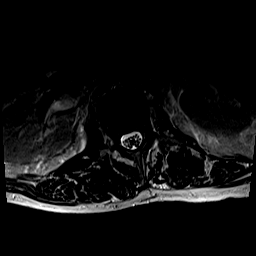

[Series 7: T1 · axial · 4.0mm · 0.39mm/px · z∈[-160,-27]mm · 3 of 31 slices shown (2 of 2)]
[im 5/31]
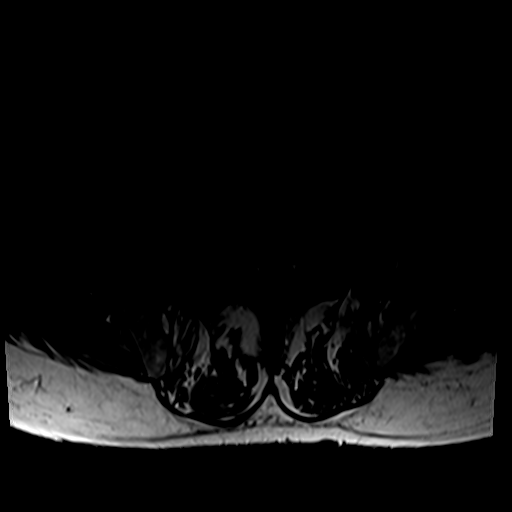
[im 17/31]
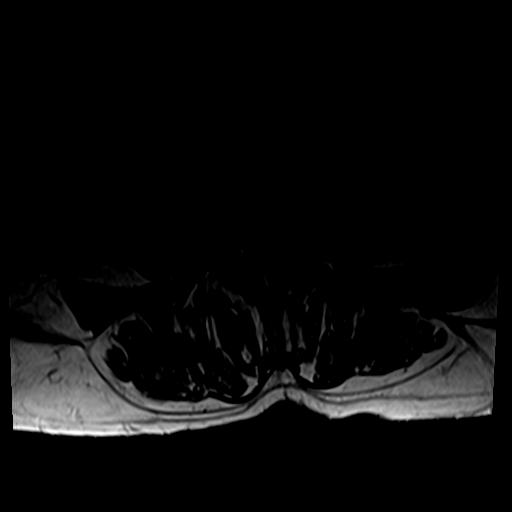
[im 26/31]
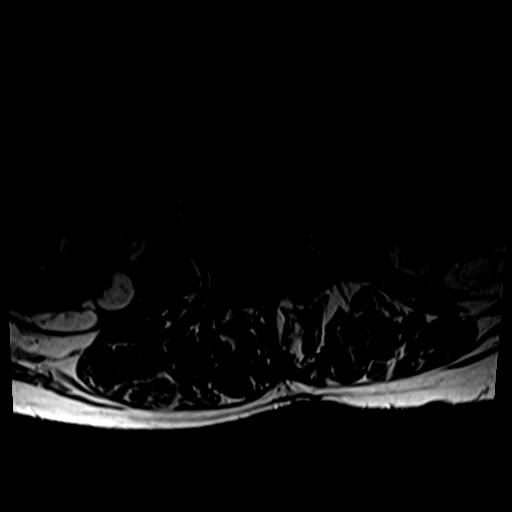

[24 of 48 positions shown; findings below may reference images not displayed]

FINDINGS: Segmentation:  Normal

Alignment:  5 mm retrolisthesis L2-3 unchanged from the prior study.

Vertebrae: Negative for fracture or mass. Bilateral sacroiliac joint
ankylosis.

Conus medullaris and cauda equina: Conus extends to the L1-2 level.
Conus and cauda equina appear normal.

Paraspinal and other soft tissues: Negative for paraspinous mass or
soft tissue swelling.

Disc levels:

L1-2: Disc degeneration with diffuse disc bulging with small central
disc protrusion. Mild facet hypertrophy and mild spinal stenosis
unchanged. Moderate foraminal stenosis on the left.

L2-3: Asymmetric advanced degenerative change on the left with fatty
changes in the bone marrow and diffuse endplate spurring left
greater than right. Severe subarticular and foraminal stenosis on
the left unchanged. Moderate right foraminal stenosis. Mild spinal
stenosis.

L3-4: Mild spinal stenosis. Mild disc bulging. Moderate facet
hypertrophy bilaterally causing moderate subarticular stenosis
bilaterally

L4-5: Mild disc degeneration and spurring. Bilateral facet
hypertrophy. Moderate subarticular stenosis bilaterally with severe
right foraminal encroachment unchanged.

L5-S1: Disc degeneration and spurring right greater than left.
Severe subarticular foraminal stenosis on the right due to spurring.
Moderate left foraminal stenosis. Bone marrow edema in the right L5
pedicle is new and is likely a stress phenomenon. No definite
fracture.
IMPRESSION: Multilevel disc and facet degeneration causing spinal and foraminal
stenosis.

Bone marrow edema in the right L5 pedicle is new likely related to
stress reaction.

## 2020-01-02 DIAGNOSIS — R251 Tremor, unspecified: Secondary | ICD-10-CM | POA: Diagnosis not present

## 2020-01-02 DIAGNOSIS — G20 Parkinson's disease: Secondary | ICD-10-CM | POA: Diagnosis not present

## 2020-01-02 DIAGNOSIS — M6289 Other specified disorders of muscle: Secondary | ICD-10-CM | POA: Diagnosis not present

## 2020-01-02 DIAGNOSIS — R413 Other amnesia: Secondary | ICD-10-CM | POA: Diagnosis not present

## 2020-01-02 DIAGNOSIS — G629 Polyneuropathy, unspecified: Secondary | ICD-10-CM | POA: Diagnosis not present

## 2020-01-06 DIAGNOSIS — Z79899 Other long term (current) drug therapy: Secondary | ICD-10-CM | POA: Diagnosis not present

## 2020-01-06 DIAGNOSIS — Z79891 Long term (current) use of opiate analgesic: Secondary | ICD-10-CM | POA: Diagnosis not present

## 2020-01-06 DIAGNOSIS — K069 Disorder of gingiva and edentulous alveolar ridge, unspecified: Secondary | ICD-10-CM | POA: Diagnosis not present

## 2020-01-06 DIAGNOSIS — Z85819 Personal history of malignant neoplasm of unspecified site of lip, oral cavity, and pharynx: Secondary | ICD-10-CM | POA: Diagnosis not present

## 2020-01-06 DIAGNOSIS — Z87891 Personal history of nicotine dependence: Secondary | ICD-10-CM | POA: Diagnosis not present

## 2020-01-06 DIAGNOSIS — C001 Malignant neoplasm of external lower lip: Secondary | ICD-10-CM | POA: Diagnosis not present

## 2020-01-06 DIAGNOSIS — L859 Epidermal thickening, unspecified: Secondary | ICD-10-CM | POA: Diagnosis not present

## 2020-02-10 NOTE — Progress Notes (Signed)
Patient ID: SUNIL HUE, male    DOB: July 23, 1940, 79 y.o.   MRN: [id number]  PCP: Towanda Malkin, MD  Chief Complaint  Patient presents with  . Gait Problem  . Memory Loss  . Establish Care    Subjective:   DRAYSON DORKO is a 79 y.o. male, presents to clinic with CC of the following:  Chief Complaint  Patient presents with  . Gait Problem  . Memory Loss  . Establish Care    HPI:  Patient is a 79 year old male former patient of Raelyn Ensign Last visit with her was 02/18/2019 for an annual physical. Communication after lab tests were done included the following:  - Your CBC shows no anemia, normal platelets, and normal white blood cells. Your iron studies show adequate iron storage. Good news. - CMP is normal - Your LDL cholesterol is quite elevated at 141, and your risk of heart attack and stroke is very high at 28.3% change in the next 10 years. I recommend going back on statin therapy and we will recheck your levels at your follow up in January. - PSA is normal  Follows up today. All in all doing ok Wife with him today  Essential (primary) hypertension hx -denied history of hypertension when asked, may have had an elevated blood pressure at some point in the past noted. Medication regimen-no medications in past to manage  BP Readings from Last 3 Encounters:  02/12/20 134/80  04/16/19 130/72  02/18/19 124/78   No Cp, palp's, SOB, no increase legs swelling, No HA's, vision changes  Hypercholesteremia Medication regimen-not taking statin presently, atorvastatin 40mg  prior and off about a year now. Discussed the statin medication today and the benefits. Noted he has the atorvastatin at home that was prescribed previously, and has not been taking if recommendations are to restart that.  Lab Results  Component Value Date   CHOL 215 (H) 02/18/2019   HDL 51 02/18/2019   LDLCALC 141 (H) 02/18/2019   TRIG 109 02/18/2019   CHOLHDL 4.2 02/18/2019      Occult blood in stools/h/o GI bleed No concerns in the recent past for recurrence Not taking protonix in past year No black stools, dark stools, blood in stools, abdominal pain Lab Results  Component Value Date   WBC 9.4 02/18/2019   HGB 14.7 02/18/2019   HCT 43.1 02/18/2019   MCV 93.3 02/18/2019   PLT 172 02/18/2019   Lab Results  Component Value Date   IRON 89 02/18/2019   TIBC 353 02/18/2019   FERRITIN 39 02/18/2019    History of oral cancer -had follow-up 01/06/2020 at [facility] with the following noted:  Assessment:  79 y.o. 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 He underwent surgical resection in June 2020. The gingiva showed high-grade dysplasia and hyperkeratosis with clear surgical margins. 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 He is a former smoker, but never used smokeless tobacco.  He returns today for routine follow-up with no new complaints, however, physical exam reveals severe periodontal disease  especially around the labial gingiva of tooth #27. Some of this tissue is ulcerated and I have gone ahead and taken a biopsy today. If this is cancer, I think that it is early and can be dealt with by local excision, however, that would likely require extraction of the tooth. If the biopsy is benign, I would simply like him to see his dentist for their evaluation of what to do about the periodontal disease. He will follow-up with me again in 2 weeks time or sooner if there are any  interim problems. Plan:   Biopsy taken in clinic today. Follow-up with me in 2 weeks time or sooner if there are any interim problems. If the biopsy is negative I will have him see his dentist to consider treatment for periodontal disease; if the biopsy shows cancer then I will likely refer him to one of my colleagues for excision possibly with tooth extraction and grafting.  Biopsy was ok per patient  Neuropathy/Parkinson's/memory loss -followed by neurology, last visit 01/02/2020 with the following assessment/plan:  IMPRESSION/PLAN  Mr. Thibault is a 79 y.o. male presenting for evaluation of  NEUROPATHY/LEG PAIN/TREMORS/PARKINSON'S - Ongoing.  - Patient with Parkinson's, neuropathy, and leg pain. Patient has shuffling gait, mask facies, decreased left arm swing, hypophonia, stiffness, and stooped forward posture.  - Increase Sinemet to 25/250 1 tab 4 times per day (breakfast, lunch, dinner, and bedtime). - We will send a referral for [facility] physical therapy for parkinson's.  - Encouraged patient to stay physically active and exercise on regular basis (15 - 30 mins per day).   MEMORY LOSS - Ongoing.  - Patient with memory loss, likely related to parkinson's.  - Memory test today is 22/30.  - Continue taking Aricept 10 mg nightly for memory loss, refilled.   Follow up with Dr. Melrose Nakayama in 6-12 months.  Patient Active Problem List   Diagnosis Date Noted  . B12 deficiency 02/27/2019  . Memory loss, short term 02/27/2019  . History of oral cancer 02/11/2019  . Bilateral impacted cerumen 11/12/2018  . Oral cancer ([place]) 09/03/2018  . At risk for falling 04/05/2018  . Screening for gout 04/05/2018  . 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 Tremor 11/27/2017  . Leg pain, bilateral 11/07/2017  . Neuropathy 11/07/2017  . Duodenal ulcer with hemorrhage 09/05/2017  . Abnormal EKG 01/31/2017  . Right bundle branch block 01/31/2017  . 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 BPH associated  with nocturia 06/21/2016  . Hyperglycemia 12/22/2015  . DDD (degenerative disc disease), lumbar 07/14/2015  . Internal hemorrhoids 02/11/2015  . Male erectile dysfunction, unspecified 02/11/2015  . Anemia 01/14/2015  . Allergic rhinitis 10/13/2014  . 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 Diverticulosis of intestine 10/13/2014  . Gastric catarrh 10/13/2014  . Gastroesophageal reflux disease 10/13/2014  . Glaucoma 10/13/2014  . Hypercholesteremia 10/13/2014  . Essential (primary) hypertension 10/13/2014  . Occult blood in stools 02/06/2014  . 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 Neoplasm of uncertain behavior of lip, oral cavity, 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      Current Outpatient Medications:  .  atorvastatin (LIPITOR) 40 MG tablet, Take 1 tablet (40 mg total) by mouth daily., Disp: 90 tablet, Rfl: 3 .  carbidopa-levodopa (SINEMET IR) 25-100 MG tablet, Take by mouth., Disp: , Rfl:  .  donepezil (ARICEPT) 10 MG tablet, Take 1 tablet by mouth at bedtime., Disp: , Rfl:  .  guaiFENesin (MUCINEX) 600 MG 12 hr tablet, Take 600 mg by mouth 2 (  two) times daily as needed., Disp: , Rfl:  .  latanoprost (XALATAN) 0.005 % ophthalmic solution, Place 1 drop into both eyes at bedtime., Disp: 2.5 mL, Rfl:  .  pantoprazole (PROTONIX) 40 MG tablet, TAKE 1 TABLET BY MOUTH EVERY DAY, Disp: 90 tablet, Rfl: 1 .  vitamin B-12 (CYANOCOBALAMIN) 1000 MCG tablet, Take 1,000 mcg by mouth daily., Disp: , Rfl:      No Known Allergies   Past Surgical History:  Procedure Laterality Date  . BACK SURGERY  April 2011  . COLONOSCOPY    . ESOPHAGOGASTRODUODENOSCOPY N/A 06/22/2017   Procedure: ESOPHAGOGASTRODUODENOSCOPY (EGD);  Surgeon: Toledo, Benay Pike, MD;  Location: [facility] ENDOSCOPY;  Service: Gastroenterology;  Laterality: N/A;     Family History  Problem Relation Age of Onset  . Healthy Mother   . Healthy Brother      Social History   Tobacco Use  . Smoking status: Former Smoker    Packs/day: 2.00     Years: 30.00    Pack years: 60.00    Types: Cigarettes    Quit date: 12/01/1982    Years since quitting: 37.2  . Smokeless tobacco: Never Used  . Tobacco comment: Quit 34 years ago  Substance Use Topics  . Alcohol use: Yes    Alcohol/week: 7.0 standard drinks    Types: 7 Glasses of wine per week    Comment: occasional    With staff assistance, above reviewed with the patient today.  ROS: As per HPI, noted usually up one time overnight to urinate, denies urgency or hesitancy, otherwise no specific complaints on a limited and focused system review   No results found for this or any previous visit (from the past 72 hour(s)).   PHQ2/9: Depression screen [facility] 2/9 02/12/2020 04/16/2019 02/18/2019 04/05/2018 01/03/2018  Decreased Interest 1 0 0 0 0  Down, Depressed, Hopeless 1 0 0 0 0  PHQ - 2 Score 2 0 0 0 0  Altered sleeping - - 0 - -  Tired, decreased energy - - 0 - -  Change in appetite - - 0 - -  Feeling bad or failure about yourself  - - 0 - -  Trouble concentrating - - 0 - -  Moving slowly or fidgety/restless - - 0 - -  Suicidal thoughts - - 0 - -  PHQ-9 Score - - 0 - -  Difficult doing work/chores - - Not difficult at all - -   PHQ-2/9 Result reviewed  Fall Risk: Fall Risk  04/16/2019 02/18/2019 04/05/2018 01/03/2018 10/02/2017  Falls in the past year? 0 0 0 No No  Number falls in past yr: 0 0 0 - -  Injury with Fall? 0 0 - - -  Risk for fall due to : Impaired balance/gait - - - -  Risk for fall due to: Comment - - - - -  Follow up Falls prevention discussed Falls evaluation completed - - -      Objective:   Vitals:   02/12/20 1440  BP: 134/80  Pulse: 78  Resp: 16  Temp: (!) 97.5 F (36.4 C)  TempSrc: Oral  SpO2: 97%  Weight: 163 lb 8 oz (74.2 kg)  Height: 5\' 5"  (1.651 m)    Body mass index is 27.21 kg/m.  Physical Exam   NAD, masked, pleasant HEENT - [place]/AT, positive masked facies, sclera anicteric, positive glasses, PERRL, EOMI, conj - non-inj'ed,  pharynx clear Neck - supple, no adenopathy, no TM, carotids 2+ and = without bruits  bilat Car - RRR without m/g/r Pulm- RR and effort normal at rest, CTA without wheeze or rales Abd - soft, NT, ND, BS+,  no masses, no obvious HSM Back - no CVA tenderness Ext - no LE edema,  Neuro/psychiatric - affect was mildly flat versus more soft-spoken, appropriate with conversation  Has mildly stooped forward posture  Alert with hypophonic speech  Grossly non-focal -adequate strength on testing extremities with mild extremity stiffness noted, sensation intact to LT in distal extremities  DTRs 1+ and equal in the patella, balance was good on 2 feet when testing [place], with slight sensory drift noted on the left, with arms extended, had a mild tremor in the upper extremities bilaterally, not marked.  Deliberate finger-to-nose with no past-pointing, had a shuffling gait,     Results for orders placed or performed in visit on 02/18/19  CBC with Differential/Platelet  Result Value Ref Range   WBC 9.4 3.8 - 10.8 Thousand/uL   RBC 4.62 4.20 - 5.80 Million/uL   Hemoglobin 14.7 13.2 - 17.1 g/dL   HCT 43.1 38 - 50 %   MCV 93.3 80.0 - 100.0 fL   MCH 31.8 27.0 - 33.0 pg   MCHC 34.1 32.0 - 36.0 g/dL   RDW 16.7 (H) 11.0 - 15.0 %   Platelets 172 140 - 400 Thousand/uL   MPV 10.1 7.5 - 12.5 fL   Neutro Abs 5,405 1,500 - 7,800 cells/uL   Lymphs Abs 2,698 850 - 3,900 cells/uL   Absolute Monocytes 968 (H) 200 - 950 cells/uL   Eosinophils Absolute 216 15.0 - 500.0 cells/uL   Basophils Absolute 113 0.0 - 200.0 cells/uL   Neutrophils Relative % 57.5 %   Total Lymphocyte 28.7 %   Monocytes Relative 10.3 %   Eosinophils Relative 2.3 %   Basophils Relative 1.2 %  COMPLETE METABOLIC PANEL WITH GFR  Result Value Ref Range   Glucose, Bld 87 65 - 99 mg/dL   BUN 13 7 - 25 mg/dL   Creat 0.99 0.70 - 1.18 mg/dL   GFR, Est Non African American 73 > OR = 60 mL/min/1.12m2   GFR, Est African American 84 > OR = 60  mL/min/1.18m2   BUN/Creatinine Ratio NOT APPLICABLE 6 - 22 (calc)   Sodium 140 135 - 146 mmol/L   Potassium 4.1 3.5 - 5.3 mmol/L   Chloride 106 98 - 110 mmol/L   CO2 23 20 - 32 mmol/L   Calcium 9.4 8.6 - 10.3 mg/dL   Total Protein 6.8 6.1 - 8.1 g/dL   Albumin 4.4 3.6 - 5.1 g/dL   Globulin 2.4 1.9 - 3.7 g/dL (calc)   AG Ratio 1.8 1.0 - 2.5 (calc)   Total Bilirubin 0.7 0.2 - 1.2 mg/dL   Alkaline phosphatase (APISO) 54 35 - 144 U/L   AST 16 10 - 35 U/L   ALT 8 (L) 9 - 46 U/L  Lipid panel  Result Value Ref Range   Cholesterol 215 (H) <200 mg/dL   HDL 51 > OR = 40 mg/dL   Triglycerides 109 <150 mg/dL   LDL Cholesterol (Calc) 141 (H) mg/dL (calc)   Total CHOL/HDL Ratio 4.2 <5.0 (calc)   Non-HDL Cholesterol (Calc) 164 (H) <130 mg/dL (calc)  Iron, TIBC and Ferritin Panel  Result Value Ref Range   Iron 89 50 - 180 mcg/dL   TIBC 353 250 - 425 mcg/dL (calc)   %SAT 25 20 - 48 % (calc)   Ferritin 39 24 - 380 ng/mL  PSA  Result Value Ref Range   PSA 0.8 < OR = 4.0 ng/mL   Last labs reviewed    Assessment & Plan:    1. Hypercholesteremia Reviewed last lipid panel, and noted the higher LDL cholesterol concern. A statin was recommended, and he stopped that soon after and has not taken it in the past year noted We will recheck a lipid panel today. Did discuss at length the benefits of statins, and the likely recommendation will be to resume a statin if the cholesterol panel remains similar to previous work is worsened some. - COMPLETE METABOLIC PANEL WITH GFR - Lipid panel  2. Essential (primary) hypertension Denies a history of hypertension when asked, and his blood pressures have been good in the recent past. - COMPLETE METABOLIC PANEL WITH GFR  3. History of GI bleed No concerns for recurrence in the recent past. He has remained off of the PPI as well. We will recheck a CBC today. - CBC with Differential/Platelet  4. Prostate cancer screening Has followed [facility] in the past  for prostate cancer screening and will recheck again today. - PSA  5. Neuropathy 6. Parkinson's disease ([place]) Continue follow-up with neurology, with continuing the Sinemet and Aricept prescribed by neurology.  Emphasized today the importance of continuing to follow-up with neurology as well. Emphasized the importance of fall prevention, and having a cane or device that provides support with walking recommended, and it was noted that he does have those to utilize.  7. Oral cancer ([place]) Continue to follow-up at [facility] that the recent biopsy was okay per the wife and patient.   Await lab results, can communicate via MyChart with those results. Tentatively, schedule follow-up in approximately 6 months time, follow-up sooner as needed.    Towanda Malkin, MD 02/12/20 2:50 PM

## 2020-02-12 ENCOUNTER — Other Ambulatory Visit: Payer: Self-pay

## 2020-02-12 ENCOUNTER — Ambulatory Visit (INDEPENDENT_AMBULATORY_CARE_PROVIDER_SITE_OTHER): Payer: Medicare Other | Admitting: Internal Medicine

## 2020-02-12 ENCOUNTER — Ambulatory Visit: Payer: Medicare Other | Admitting: Internal Medicine

## 2020-02-12 ENCOUNTER — Encounter: Payer: Self-pay | Admitting: Internal Medicine

## 2020-02-12 VITALS — BP 134/80 | HR 78 | Temp 97.5°F | Resp 16 | Ht 65.0 in | Wt 163.5 lb

## 2020-02-12 DIAGNOSIS — G629 Polyneuropathy, unspecified: Secondary | ICD-10-CM

## 2020-02-12 DIAGNOSIS — Z125 Encounter for screening for malignant neoplasm of prostate: Secondary | ICD-10-CM

## 2020-02-12 DIAGNOSIS — E7800 Pure hypercholesterolemia, unspecified: Secondary | ICD-10-CM | POA: Diagnosis not present

## 2020-02-12 DIAGNOSIS — Z8719 Personal history of other diseases of the digestive system: Secondary | ICD-10-CM

## 2020-02-12 DIAGNOSIS — C069 Malignant neoplasm of mouth, unspecified: Secondary | ICD-10-CM

## 2020-02-12 DIAGNOSIS — I10 Essential (primary) hypertension: Secondary | ICD-10-CM

## 2020-02-12 DIAGNOSIS — G20 Parkinson's disease: Secondary | ICD-10-CM | POA: Insufficient documentation

## 2020-02-12 LAB — CBC WITH DIFFERENTIAL/PLATELET
Hemoglobin: 14.1 g/dL (ref 13.2–17.1)
Lymphs Abs: 2610 cells/uL (ref 850–3900)
MPV: 10 fL (ref 7.5–12.5)

## 2020-02-13 ENCOUNTER — Other Ambulatory Visit: Payer: Self-pay | Admitting: Internal Medicine

## 2020-02-13 DIAGNOSIS — E7800 Pure hypercholesterolemia, unspecified: Secondary | ICD-10-CM

## 2020-02-13 LAB — COMPLETE METABOLIC PANEL WITH GFR
AG Ratio: 1.9 (calc) (ref 1.0–2.5)
ALT: 11 U/L (ref 9–46)
AST: 17 U/L (ref 10–35)
Albumin: 4.4 g/dL (ref 3.6–5.1)
Alkaline phosphatase (APISO): 52 U/L (ref 35–144)
BUN: 14 mg/dL (ref 7–25)
CO2: 27 mmol/L (ref 20–32)
Calcium: 9.3 mg/dL (ref 8.6–10.3)
Chloride: 104 mmol/L (ref 98–110)
Creat: 1.09 mg/dL (ref 0.70–1.18)
GFR, Est African American: 74 mL/min/{1.73_m2} (ref >=60)
GFR, Est Non African American: 64 mL/min/{1.73_m2} (ref >=60)
Globulin: 2.3 g/dL (calc) (ref 1.9–3.7)
Glucose, Bld: 85 mg/dL (ref 65–99)
Potassium: 4.3 mmol/L (ref 3.5–5.3)
Sodium: 139 mmol/L (ref 135–146)
Total Bilirubin: 0.9 mg/dL (ref 0.2–1.2)
Total Protein: 6.7 g/dL (ref 6.1–8.1)

## 2020-02-13 LAB — LIPID PANEL
Cholesterol: 208 mg/dL — ABNORMAL HIGH (ref <200)
HDL: 43 mg/dL (ref >=40)
LDL Cholesterol (Calc): 139 mg/dL (calc) — ABNORMAL HIGH
Non-HDL Cholesterol (Calc): 165 mg/dL (calc) — ABNORMAL HIGH (ref <130)
Total CHOL/HDL Ratio: 4.8 (calc) (ref <5.0)
Triglycerides: 135 mg/dL (ref <150)

## 2020-02-13 LAB — CBC WITH DIFFERENTIAL/PLATELET
Absolute Monocytes: 785 cells/uL (ref 200–950)
Basophils Absolute: 69 cells/uL (ref 0–200)
Basophils Relative: 0.9 %
Eosinophils Absolute: 293 cells/uL (ref 15–500)
Eosinophils Relative: 3.8 %
HCT: 41.1 % (ref 38.5–50.0)
MCH: 32.7 pg (ref 27.0–33.0)
MCHC: 34.3 g/dL (ref 32.0–36.0)
MCV: 95.4 fL (ref 80.0–100.0)
Monocytes Relative: 10.2 %
Neutro Abs: 3942 cells/uL (ref 1500–7800)
Neutrophils Relative %: 51.2 %
Platelets: 170 10*3/uL (ref 140–400)
RBC: 4.31 10*6/uL (ref 4.20–5.80)
RDW: 17.3 % — ABNORMAL HIGH (ref 11.0–15.0)
Total Lymphocyte: 33.9 %
WBC: 7.7 10*3/uL (ref 3.8–10.8)

## 2020-02-13 LAB — PSA: PSA: 1.17 ng/mL (ref <=4.0)

## 2020-02-13 MED ORDER — ATORVASTATIN CALCIUM 20 MG PO TABS
20.0000 mg | ORAL_TABLET | Freq: Every day | ORAL | 3 refills | Status: DC
Start: 2020-02-13 — End: 2021-03-15

## 2020-02-13 NOTE — Progress Notes (Signed)
After reviewing his lab results, and discussions with the patient on his visit, felt best to resume the atorvastatin, and will do so at a slightly lower dose of 20 mg daily to start. This prescription was sent to his mail order pharmacy.

## 2020-02-17 ENCOUNTER — Other Ambulatory Visit: Payer: Self-pay | Admitting: Internal Medicine

## 2020-02-17 DIAGNOSIS — Z8719 Personal history of other diseases of the digestive system: Secondary | ICD-10-CM

## 2020-02-17 DIAGNOSIS — R195 Other fecal abnormalities: Secondary | ICD-10-CM

## 2020-02-17 MED ORDER — PANTOPRAZOLE SODIUM 40 MG PO TBEC: 40.0000 mg | DELAYED_RELEASE_TABLET | Freq: Every day | ORAL | 1 refills | Status: DC

## 2020-03-26 DIAGNOSIS — M48062 Spinal stenosis, lumbar region with neurogenic claudication: Secondary | ICD-10-CM | POA: Insufficient documentation

## 2020-03-26 DIAGNOSIS — G8929 Other chronic pain: Secondary | ICD-10-CM

## 2020-03-26 HISTORY — DX: Other chronic pain: G89.29

## 2020-04-16 ENCOUNTER — Ambulatory Visit: Payer: Medicare Other

## 2020-04-30 ENCOUNTER — Other Ambulatory Visit: Payer: Self-pay

## 2020-04-30 ENCOUNTER — Ambulatory Visit (INDEPENDENT_AMBULATORY_CARE_PROVIDER_SITE_OTHER): Payer: Medicare Other

## 2020-04-30 VITALS — BP 132/82 | HR 71 | Temp 97.4°F | Resp 16 | Ht 65.0 in | Wt 166.3 lb

## 2020-04-30 DIAGNOSIS — Z0000 Encounter for general adult medical examination without abnormal findings: Secondary | ICD-10-CM | POA: Diagnosis not present

## 2020-04-30 NOTE — Progress Notes (Addendum)
Subjective:   Michael Stevens is a 80 y.o. male who presents for Medicare Annual/Subsequent preventive examination.  Review of Systems     Cardiac Risk Factors include: advanced age (>35men, >58 women);dyslipidemia;male gender     Objective:    Today's Vitals   04/30/20 1542  BP: 132/82  Pulse: 71  Resp: 16  Temp: (!) 97.4 F (36.3 C)  TempSrc: Oral  SpO2: 98%  Weight: 166 lb 4.8 oz (75.4 kg)  Height: 5\' 5"  (1.651 m)   Body mass index is 27.67 kg/m.  Advanced Directives 04/30/2020 04/16/2019 04/05/2018 06/22/2017 06/21/2017 06/21/2017 03/24/2017  Does Patient Have a Medical Advance Directive? Yes Yes Yes Yes Yes Yes No  Type of Paramedic of [place];Living will [place];Living will Living will;Healthcare Power of Attorney Living will Living will Living will -  Does patient want to make changes to medical advance directive? - - No - Patient declined - No - Patient declined No - Patient declined -  Copy of [place] in Chart? No - copy requested No - copy requested No - copy requested No - copy requested - - -  Would patient like information on creating a medical advance directive? - - - - - - Yes (MAU/Ambulatory/Procedural Areas - Information given)  Pre-existing out of facility DNR order (yellow form or pink MOST form) - - - - - - -    Current Medications (verified) Outpatient Encounter Medications as of 04/30/2020  Medication Sig  . atorvastatin (LIPITOR) 20 MG tablet Take 1 tablet (20 mg total) by mouth daily.  . carbidopa-levodopa (SINEMET IR) 25-250 MG tablet Take 1 tablet by mouth 4 (four) times daily.  Marland Kitchen donepezil (ARICEPT) 10 MG tablet Take 1 tablet by mouth at bedtime.  Marland Kitchen latanoprost (XALATAN) 0.005 % ophthalmic solution Place 1 drop into both eyes at bedtime.  . pantoprazole (PROTONIX) 40 MG tablet Take 1 tablet (40 mg total) by mouth daily.  . vitamin B-12 (CYANOCOBALAMIN) 1000 MCG tablet Take 1,000 mcg by  mouth daily.  . [DISCONTINUED] carbidopa-levodopa (SINEMET IR) 25-100 MG tablet Take by mouth.  . [DISCONTINUED] guaiFENesin (MUCINEX) 600 MG 12 hr tablet Take 600 mg by mouth 2 (two) times daily as needed.   No facility-administered encounter medications on file as of 04/30/2020.    Allergies (verified) Patient has no known allergies.   History: Past Medical History:  Diagnosis Date  . Allergic rhinitis   . Anemia   . Arthritis    Back  . Bladder neck obstruction   . 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 Dementia ([place])   . Duodenal ulcer with hemorrhage   . GERD (gastroesophageal reflux disease)    ocassional  . Glaucoma   . Hypercholesteremia   . Hypertension   . Impotence   . 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 Mixed hyperlipidemia   . 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 BACK SURGERY  April 2011  . COLONOSCOPY    . ESOPHAGOGASTRODUODENOSCOPY N/A 06/22/2017   Procedure: ESOPHAGOGASTRODUODENOSCOPY (EGD);  Surgeon: Toledo, Benay Pike, MD;  Location: [facility] ENDOSCOPY;  Service: Gastroenterology;  Laterality: N/A;   Family History  Problem Relation Age of Onset  . Healthy Mother   . Healthy Brother    Social History   Socioeconomic History  . Marital status: Married    Spouse name:  [name]  . Number of children: 2  . Years of education: Not on file  . Highest education level: High school graduate  Occupational History  . Occupation: Retired  Tobacco Use  . Smoking status: Former Smoker    Packs/day: 2.00    Years: 30.00    Pack years: 60.00    Types: Cigarettes    Quit date: 12/01/1982    Years since quitting: 37.4  . Smokeless tobacco: Never Used  . Tobacco comment: Quit 34 years ago  Vaping Use  . Vaping Use: Never used  Substance and Sexual Activity  . Alcohol use: Yes    Alcohol/week: 1.0 standard drink    Types: 1 Glasses of wine  per week    Comment: occasional  . Drug use: No  . Sexual activity: Not Currently  Other Topics Concern  . Not on file  Social History Narrative  . Not on file   Social Determinants of Health   Financial Resource Strain: Low Risk   . Difficulty of Paying Living Expenses: Not hard at all  Food Insecurity: No Food Insecurity  . Worried About Charity fundraiser in the Last Year: Never true  . Ran Out of Food in the Last Year: Never true  Transportation Needs: No Transportation Needs  . Lack of Transportation (Medical): No  . Lack of Transportation (Non-Medical): No  Physical Activity: Inactive  . Days of Exercise per Week: 0 days  . Minutes of Exercise per Session: 0 min  Stress: No Stress Concern Present  . Feeling of Stress : Not at all  Social Connections: Moderately Isolated  . Frequency of Communication with Friends and Family: Three times a week  . Frequency of Social Gatherings with Friends and Family: More than three times a week  . Attends Religious Services: Never  . Active Member of Clubs or Organizations: No  . Attends Archivist Meetings: Never  . Marital Status: Married    Tobacco Counseling Counseling given: Not Answered Comment: Quit 34 years ago   Clinical Intake:  Pre-visit preparation completed: Yes  Pain : No/denies pain     BMI - recorded: 27.67 Nutritional Status: BMI 25 -29 Overweight Nutritional Risks: None Diabetes: No  How often do you need to have someone help you when you read instructions, pamphlets, or other written materials from your doctor or pharmacy?: 1 - Never    Interpreter Needed?: No  Information entered by :: Clemetine Marker LPN   Activities of Daily Living In your present state of health, do you have any difficulty performing the following activities: 04/30/2020 02/12/2020  Hearing? N Y  Comment declines hearing aids -  Vision? N N  Difficulty concentrating or making decisions? Tempie Donning  Walking or climbing  stairs? Y Y  Dressing or bathing? N N  Doing errands, shopping? Tempie Donning  Preparing Food and eating ? N -  Using the Toilet? N -  In the past six months, have you accidently leaked urine? N -  Do you have problems with loss of bowel control? N -  Managing your Medications? Y -  Managing your Finances? Y -  Housekeeping or managing your Housekeeping? N -  Some recent data might be hidden    Patient Care Team: Towanda Malkin, MD as PCP - General (Internal Medicine) Samara Deist, DPM as Referring Physician (Podiatry) Loletta Parish, MD as Referring Physician (Hematology and Oncology) Meeler, Sherren Kerns, FNP (Family Medicine) Efrain Sella, MD as Consulting Physician (Gastroenterology)  Anabel Bene, MD as Referring Physician (Neurology)  Indicate any recent Medical Services you may have received from other than [facility] providers in the past year (date may be approximate).     Assessment:   This is a routine wellness examination for [place].  Hearing/Vision screen  Hearing Screening   125Hz  250Hz  500Hz  1000Hz  2000Hz  3000Hz  4000Hz  6000Hz  8000Hz   Right ear:           Left ear:           Comments: Pt denies hearing difficulty  Vision Screening Comments: [facility] screenings [facility] Dr. Wallace Going  Dietary issues and exercise activities discussed: Current Exercise Habits: The patient does not participate in regular exercise at present, Exercise limited by: neurologic condition(s)  Goals    . Increase physical activity     Recommend increasing physical activity to at least 90 minutes per week.     . Prevent falls     Recommend to remove items from home that may cause slips or trips.    . Reduce sugar intake      Continue to decrease sugar intake daily.       Depression Screen PHQ 2/9 Scores 04/30/2020 02/12/2020 04/16/2019 02/18/2019 04/05/2018 01/03/2018 10/02/2017  PHQ - 2 Score 0 2 0 0 0 0 0  PHQ- 9 Score - - - 0 - - -    Fall Risk Fall Risk   04/30/2020 04/16/2019 02/18/2019 04/05/2018 01/03/2018  Falls in the past year? 0 0 0 0 No  Number falls in past yr: 0 0 0 0 -  Injury with Fall? 0 0 0 - -  Risk for fall due to : Impaired balance/gait Impaired balance/gait - - -  Risk for fall due to: Comment - - - - -  Follow up Falls prevention discussed Falls prevention discussed Falls evaluation completed - -    FALL RISK PREVENTION PERTAINING TO THE HOME:  Any stairs in or around the home? Yes  If so, are there any without handrails? No  Home free of loose throw rugs in walkways, pet beds, electrical cords, etc? Yes  Adequate lighting in your home to reduce risk of falls? Yes   ASSISTIVE DEVICES UTILIZED TO PREVENT FALLS:  Life alert? No  Use of a cane, walker or w/c? No  Grab bars in the bathroom? Yes  Shower chair or bench in shower? Yes  Elevated toilet seat or a handicapped toilet? No   TIMED UP AND GO:  Was the test performed? Yes .  Length of time to ambulate 10 feet: 7 sec.   Gait slow and steady without use of assistive device  Cognitive Function: Cognitive status assessed by direct observation. Patient has current diagnosis of cognitive impairment. Patient is followed by neurology for ongoing assessment.        6CIT Screen 04/05/2018 03/24/2017 07/25/2016  What Year? 0 points 4 points 0 points  What month? 0 points 0 points 0 points  What time? 0 points 0 points 0 points  Count back from 20 0 points 0 points 0 points  Months in reverse 0 points 0 points 2 points  Repeat phrase 6 points 6 points 2 points  Total Score 6 10 4     Immunizations Immunization History  Administered Date(s) Administered  . Influenza Inj Mdck Quad Pf 01/18/2019  . Influenza, High Dose Seasonal PF 01/03/2018  . Influenza-Unspecified 01/17/2015, 03/01/2016, 01/30/2020  . Pneumococcal Conjugate-13 03/24/2017  . Pneumococcal Polysaccharide-23 04/18/2010  TDAP status: Due, Education has been provided regarding the importance of this  vaccine. Advised may receive this vaccine at local pharmacy or Health Dept. Aware to provide a copy of the vaccination record if obtained from local pharmacy or Health Dept. Verbalized acceptance and understanding.  Flu Vaccine status: Up to date  Pneumococcal vaccine status: Up to date  Covid-19 vaccine status: Completed vaccines   Qualifies for Shingles Vaccine? Yes   Zostavax completed No   Shingrix Completed?: No.    Education has been provided regarding the importance of this vaccine. Patient has been advised to call insurance company to determine out of pocket expense if they have not yet received this vaccine. Advised may also receive vaccine at local pharmacy or Health Dept. Verbalized acceptance and understanding.  Screening Tests Health Maintenance  Topic Date Due  . Hepatitis C Screening  Never done  . COVID-19 Vaccine (1) Never done  . TETANUS/TDAP  04/18/2026 (Originally 01/11/1960)  . INFLUENZA VACCINE  Completed  . PNA vac Low Risk Adult  Completed    Health Maintenance  Health Maintenance Due  Topic Date Due  . Hepatitis C Screening  Never done  . COVID-19 Vaccine (1) Never done    Colorectal cancer screening: No longer required.   Lung Cancer Screening: (Low Dose CT Chest recommended if Age 26-80 years, 30 pack-year currently smoking OR have quit w/in 15years.) does not qualify.   Additional Screening:  Hepatitis C Screening: does qualify: postponed  Vision Screening: Recommended annual ophthalmology exams for early detection of glaucoma and other disorders of the eye. Is the patient up to date with their annual eye exam?  Yes  Who is the provider or what is the name of the office in which the patient attends annual eye exams? [place] Screening: Recommended annual dental exams for proper oral hygiene  Community Resource Referral / Chronic Care Management: CRR required this visit?  No   CCM required this visit?  No      Plan:      I have personally reviewed and noted the following in the patient's chart:   . Medical and social history . Use of alcohol, tobacco or illicit drugs  . Current medications and supplements . Functional ability and status . Nutritional status . Physical activity . Advanced directives . List of other physicians . Hospitalizations, surgeries, and ER visits in previous 12 months . Vitals . Screenings to include cognitive, depression, and falls . Referrals and appointments  In addition, I have reviewed and discussed with patient certain preventive protocols, quality metrics, and best practice recommendations. A written personalized care plan for preventive services as well as general preventive health recommendations were provided to patient.     Clemetine Marker, LPN   0/01/2724   Nurse Notes: pt accompanied to visit today by his wife [name]. Pt doing well and appreciative of visit today.

## 2020-04-30 NOTE — Patient Instructions (Signed)
[name] , Thank you for taking time to come for your Medicare Wellness Visit. I appreciate your ongoing commitment to your health goals. Please review the following plan we discussed and let me know if I can assist you in the future.   Screening recommendations/referrals: Colonoscopy: no longer required Recommended yearly ophthalmology/optometry visit for glaucoma screening and checkup Recommended yearly dental visit for hygiene and checkup  Vaccinations: Influenza vaccine: done 01/30/20 Pneumococcal vaccine: done 03/24/17 Tdap vaccine: due Shingles vaccine: Shingrix discussed. Please contact your pharmacy for coverage information.  Covid-19: please bring a copy of your vaccine record to your next appointment.   Advanced directives: Please bring a copy of your health care power of attorney and living will to the office at your convenience.  Conditions/risks identified: Recommend increasing physical activity   Next appointment: Follow up in one year for your annual wellness visit.   Preventive Care 40 Years and Older, Male Preventive care refers to lifestyle choices and visits with your health care provider that can promote health and wellness. What does preventive care include?  A yearly physical exam. This is also called an annual well check.  Dental exams once or twice a year.  Routine eye exams. Ask your health care provider how often you should have your eyes checked.  Personal lifestyle choices, including:  Daily care of your teeth and gums.  Regular physical activity.  Eating a healthy diet.  Avoiding tobacco and drug use.  Limiting alcohol use.  Practicing safe sex.  Taking low doses of aspirin every day.  Taking vitamin and mineral supplements as recommended by your health care provider. What happens during an annual well check? The services and screenings done by your health care provider during your annual well check will depend on your age, overall  health, lifestyle risk factors, and family history of disease. Counseling  Your health care provider may ask you questions about your:  Alcohol use.  Tobacco use.  Drug use.  Emotional well-being.  Home and relationship well-being.  Sexual activity.  Eating habits.  History of falls.  Memory and ability to understand (cognition).  Work and work Statistician. Screening  You may have the following tests or measurements:  Height, weight, and BMI.  Blood pressure.  Lipid and cholesterol levels. These may be checked every 5 years, or more frequently if you are over 36 years old.  Skin check.  Lung cancer screening. You may have this screening every year starting at age 68 if you have a 30-pack-year history of smoking and currently smoke or have quit within the past 15 years.  Fecal occult blood test (FOBT) of the stool. You may have this test every year starting at age 74.  Flexible sigmoidoscopy or colonoscopy. You may have a sigmoidoscopy every 5 years or a colonoscopy every 10 years starting at age 55.  Prostate cancer screening. Recommendations will vary depending on your family history and other risks.  Hepatitis C blood test.  Hepatitis B blood test.  Sexually transmitted disease (STD) testing.  Diabetes screening. This is done by checking your blood sugar (glucose) after you have not eaten for a while (fasting). You may have this done every 1-3 years.  Abdominal aortic aneurysm (AAA) screening. You may need this if you are a current or former smoker.  Osteoporosis. You may be screened starting at age 33 if you are at high risk. Talk with your health care provider about your test results, treatment options, and if necessary, the need for  more tests. Vaccines  Your health care provider may recommend certain vaccines, such as:  Influenza vaccine. This is recommended every year.  Tetanus, diphtheria, and acellular pertussis (Tdap, Td) vaccine. You may need a Td  booster every 10 years.  Zoster vaccine. You may need this after age 67.  Pneumococcal 13-valent conjugate (PCV13) vaccine. One dose is recommended after age 61.  Pneumococcal polysaccharide (PPSV23) vaccine. One dose is recommended after age 45. Talk to your health care provider about which screenings and vaccines you need and how often you need them. This information is not intended to replace advice given to you by your health care provider. Make sure you discuss any questions you have with your health care provider. Document Released: 05/01/2015 Document Revised: 12/23/2015 Document Reviewed: 02/03/2015 Elsevier Interactive Patient Education  2017 [place] Prevention in the Home Falls can cause injuries. They can happen to people of all ages. There are many things you can do to make your home safe and to help prevent falls. What can I do on the outside of my home?  Regularly fix the edges of walkways and driveways and fix any cracks.  Remove anything that might make you trip as you walk through a door, such as a raised step or threshold.  Trim any bushes or trees on the path to your home.  Use bright outdoor lighting.  Clear any walking paths of anything that might make someone trip, such as rocks or tools.  Regularly check to see if handrails are loose or broken. Make sure that both sides of any steps have handrails.  Any raised decks and porches should have guardrails on the edges.  Have any leaves, snow, or ice cleared regularly.  Use sand or salt on walking paths during winter.  Clean up any spills in your garage right away. This includes oil or grease spills. What can I do in the bathroom?  Use night lights.  Install grab bars by the toilet and in the tub and shower. Do not use towel bars as grab bars.  Use non-skid mats or decals in the tub or shower.  If you need to sit down in the shower, use a plastic, non-slip stool.  Keep the floor dry. Clean up  any water that spills on the floor as soon as it happens.  Remove soap buildup in the tub or shower regularly.  Attach bath mats securely with double-sided non-slip rug tape.  Do not have throw rugs and other things on the floor that can make you trip. What can I do in the bedroom?  Use night lights.  Make sure that you have a light by your bed that is easy to reach.  Do not use any sheets or blankets that are too big for your bed. They should not hang down onto the floor.  Have a firm chair that has side arms. You can use this for support while you get dressed.  Do not have throw rugs and other things on the floor that can make you trip. What can I do in the kitchen?  Clean up any spills right away.  Avoid walking on wet floors.  Keep items that you use a lot in easy-to-reach places.  If you need to reach something above you, use a strong step stool that has a grab bar.  Keep electrical cords out of the way.  Do not use floor polish or wax that makes floors slippery. If you must use wax, use non-skid  floor wax.  Do not have throw rugs and other things on the floor that can make you trip. What can I do with my stairs?  Do not leave any items on the stairs.  Make sure that there are handrails on both sides of the stairs and use them. Fix handrails that are broken or loose. Make sure that handrails are as long as the stairways.  Check any carpeting to make sure that it is firmly attached to the stairs. Fix any carpet that is loose or worn.  Avoid having throw rugs at the top or bottom of the stairs. If you do have throw rugs, attach them to the floor with carpet tape.  Make sure that you have a light switch at the top of the stairs and the bottom of the stairs. If you do not have them, ask someone to add them for you. What else can I do to help prevent falls?  Wear shoes that:  Do not have high heels.  Have rubber bottoms.  Are comfortable and fit you well.  Are  closed at the toe. Do not wear sandals.  If you use a stepladder:  Make sure that it is fully opened. Do not climb a closed stepladder.  Make sure that both sides of the stepladder are locked into place.  Ask someone to hold it for you, if possible.  Clearly mark and make sure that you can see:  Any grab bars or handrails.  First and last steps.  Where the edge of each step is.  Use tools that help you move around (mobility aids) if they are needed. These include:  Canes.  Walkers.  Scooters.  Crutches.  Turn on the lights when you go into a dark area. Replace any light bulbs as soon as they burn out.  Set up your furniture so you have a clear path. Avoid moving your furniture around.  If any of your floors are uneven, fix them.  If there are any pets around you, be aware of where they are.  Review your medicines with your doctor. Some medicines can make you feel dizzy. This can increase your chance of falling. Ask your doctor what other things that you can do to help prevent falls. This information is not intended to replace advice given to you by your health care provider. Make sure you discuss any questions you have with your health care provider. Document Released: 01/29/2009 Document Revised: 09/10/2015 Document Reviewed: 05/09/2014 Elsevier Interactive Patient Education  2017 [employer].

## 2020-09-17 ENCOUNTER — Ambulatory Visit
Admission: RE | Admit: 2020-09-17 | Discharge: 2020-09-17 | Disposition: A | Discharge status: Home / Self Care | Payer: Medicare Other | Source: Ambulatory Visit | Attending: Unknown Physician Specialty | Admitting: Unknown Physician Specialty

## 2020-09-17 ENCOUNTER — Encounter: Payer: Self-pay | Admitting: Unknown Physician Specialty

## 2020-09-17 ENCOUNTER — Ambulatory Visit (INDEPENDENT_AMBULATORY_CARE_PROVIDER_SITE_OTHER): Payer: Medicare Other | Admitting: Unknown Physician Specialty

## 2020-09-17 ENCOUNTER — Ambulatory Visit
Admission: RE | Admit: 2020-09-17 | Discharge: 2020-09-17 | Disposition: A | Discharge status: Home / Self Care | Payer: Medicare Other | Attending: Unknown Physician Specialty | Admitting: Unknown Physician Specialty

## 2020-09-17 ENCOUNTER — Other Ambulatory Visit: Payer: Self-pay

## 2020-09-17 VITALS — BP 118/78 | HR 98 | Temp 98.1°F | Resp 14 | Ht 65.0 in | Wt 166.9 lb

## 2020-09-17 DIAGNOSIS — R059 Cough, unspecified: Secondary | ICD-10-CM

## 2020-09-17 DIAGNOSIS — M25473 Effusion, unspecified ankle: Secondary | ICD-10-CM

## 2020-09-17 DIAGNOSIS — E7800 Pure hypercholesterolemia, unspecified: Secondary | ICD-10-CM

## 2020-09-17 NOTE — Assessment & Plan Note (Signed)
Check cholesterol and CMP.  Wife would like him to come less often.  If we are just following cholesterol I think that is reasonable

## 2020-09-17 NOTE — Progress Notes (Signed)
BP 118/78   Pulse 98   Temp 98.1 F (36.7 C)   Resp 14   Ht 5\' 5"  (1.651 m)   Wt 166 lb 14.4 oz (75.7 kg)   SpO2 99%   BMI 27.77 kg/m    Subjective:    Patient ID: Michael Stevens, male    DOB: 05-10-1940, 80 y.o.   MRN: [id number]  HPI: Michael Stevens is a 80 y.o. male  Chief Complaint  Patient presents with  . Follow-up   Hyperlipidemia Using medications without problems: No Muscle aches  Diet compliance/Exercise:Not much activity due to Parkinson's    Relevant past medical, surgical, family and social history reviewed and updated as indicated. Interim medical history since our last visit reviewed. Allergies and medications reviewed and updated.  Review of Systems  Constitutional: Positive for activity change.  HENT: Negative.   Respiratory: Positive for cough.   Cardiovascular:       Bilateral ankle swelling    Per HPI unless specifically indicated above     Objective:    BP 118/78   Pulse 98   Temp 98.1 F (36.7 C)   Resp 14   Ht 5\' 5"  (1.651 m)   Wt 166 lb 14.4 oz (75.7 kg)   SpO2 99%   BMI 27.77 kg/m   Wt Readings from Last 3 Encounters:  09/17/20 166 lb 14.4 oz (75.7 kg)  04/30/20 166 lb 4.8 oz (75.4 kg)  02/12/20 163 lb 8 oz (74.2 kg)    Physical Exam Constitutional:      General: He is not in acute distress.    Appearance: Normal appearance. He is well-developed.  HENT:     Head: Normocephalic and atraumatic.  Eyes:     General: Lids are normal. No scleral icterus.       Right eye: No discharge.        Left eye: No discharge.     Conjunctiva/sclera: Conjunctivae normal.  Neck:     Vascular: No carotid bruit or JVD.  Cardiovascular:     Rate and Rhythm: Normal rate and regular rhythm.     Heart sounds: Normal heart sounds.  Pulmonary:     Effort: Pulmonary effort is normal. No respiratory distress.     Breath sounds: Rhonchi present.     Comments: Rhonchi clears with coughing Abdominal:     Palpations: There is no hepatomegaly  or splenomegaly.  Musculoskeletal:        General: Normal range of motion.     Cervical back: Normal range of motion and neck supple.     Right lower leg: 1+ Edema present.     Left lower leg: 1+ Edema present.  Skin:    General: Skin is warm and dry.     Coloration: Skin is not pale.     Findings: No rash.  Neurological:     Mental Status: He is alert and oriented to person, place, and time.  Psychiatric:        Behavior: Behavior normal.        Thought Content: Thought content normal.        Judgment: Judgment normal.     Results for orders placed or performed in visit on 02/12/20  CBC with Differential/Platelet  Result Value Ref Range   WBC 7.7 3.8 - 10.8 Thousand/uL   RBC 4.31 4.20 - 5.80 Million/uL   Hemoglobin 14.1 13.2 - 17.1 g/dL   HCT 41.1 38.5 - 50.0 %   MCV 95.4 80.0 -  100.0 fL   MCH 32.7 27.0 - 33.0 pg   MCHC 34.3 32.0 - 36.0 g/dL   RDW 17.3 (H) 11.0 - 15.0 %   Platelets 170 140 - 400 Thousand/uL   MPV 10.0 7.5 - 12.5 fL   Neutro Abs 3,942 1,500 - 7,800 cells/uL   Lymphs Abs 2,610 850 - 3,900 cells/uL   Absolute Monocytes 785 200 - 950 cells/uL   Eosinophils Absolute 293 15 - 500 cells/uL   Basophils Absolute 69 0 - 200 cells/uL   Neutrophils Relative % 51.2 %   Total Lymphocyte 33.9 %   Monocytes Relative 10.2 %   Eosinophils Relative 3.8 %   Basophils Relative 0.9 %  COMPLETE METABOLIC PANEL WITH GFR  Result Value Ref Range   Glucose, Bld 85 65 - 99 mg/dL   BUN 14 7 - 25 mg/dL   Creat 1.09 0.70 - 1.18 mg/dL   GFR, Est Non African American 64 > OR = 60 mL/min/1.74m2   GFR, Est African American 74 > OR = 60 mL/min/1.36m2   BUN/Creatinine Ratio NOT APPLICABLE 6 - 22 (calc)   Sodium 139 135 - 146 mmol/L   Potassium 4.3 3.5 - 5.3 mmol/L   Chloride 104 98 - 110 mmol/L   CO2 27 20 - 32 mmol/L   Calcium 9.3 8.6 - 10.3 mg/dL   Total Protein 6.7 6.1 - 8.1 g/dL   Albumin 4.4 3.6 - 5.1 g/dL   Globulin 2.3 1.9 - 3.7 g/dL (calc)   AG Ratio 1.9 1.0 - 2.5  (calc)   Total Bilirubin 0.9 0.2 - 1.2 mg/dL   Alkaline phosphatase (APISO) 52 35 - 144 U/L   AST 17 10 - 35 U/L   ALT 11 9 - 46 U/L  Lipid panel  Result Value Ref Range   Cholesterol 208 (H) <200 mg/dL   HDL 43 > OR = 40 mg/dL   Triglycerides 135 <150 mg/dL   LDL Cholesterol (Calc) 139 (H) mg/dL (calc)   Total CHOL/HDL Ratio 4.8 <5.0 (calc)   Non-HDL Cholesterol (Calc) 165 (H) <130 mg/dL (calc)  PSA  Result Value Ref Range   PSA 1.17 < OR = 4.0 ng/mL      Assessment & Plan:   Problem List Items Addressed This Visit      Unprioritized   Hypercholesteremia (Chronic)    Check cholesterol and CMP.  Wife would like him to come less often.  If we are just following cholesterol I think that is reasonable      Relevant Orders   Comprehensive metabolic panel   Lipid panel    Other Visit Diagnoses    Ankle swelling, unspecified laterality    -  Primary   Due to cough and swelling, will check Chest x-ray.  Discussed activity, support hose, and ankle pumps   Relevant Orders   DG Chest 2 View   Cough       Suspect secondary to allergies and atelectesis due to immobility.  Will get chest x-ray   Relevant Orders   DG Chest 2 View   CBC with Differential/Platelet       Follow up plan: Return in about 6 months (around 03/19/2021).

## 2020-09-18 LAB — CBC WITH DIFFERENTIAL/PLATELET
Absolute Monocytes: 930 cells/uL (ref 200–950)
Basophils Absolute: 66 cells/uL (ref 0–200)
Basophils Relative: 0.8 %
Eosinophils Absolute: 232 cells/uL (ref 15–500)
Eosinophils Relative: 2.8 %
HCT: 36.6 % — ABNORMAL LOW (ref 38.5–50.0)
Hemoglobin: 12.4 g/dL — ABNORMAL LOW (ref 13.2–17.1)
Lymphs Abs: 2291 cells/uL (ref 850–3900)
MCH: 31.7 pg (ref 27.0–33.0)
MCHC: 33.9 g/dL (ref 32.0–36.0)
MCV: 93.6 fL (ref 80.0–100.0)
MPV: 9.3 fL (ref 7.5–12.5)
Monocytes Relative: 11.2 %
Neutro Abs: 4781 cells/uL (ref 1500–7800)
Neutrophils Relative %: 57.6 %
Platelets: 170 10*3/uL (ref 140–400)
RBC: 3.91 10*6/uL — ABNORMAL LOW (ref 4.20–5.80)
RDW: 16.8 % — ABNORMAL HIGH (ref 11.0–15.0)
Total Lymphocyte: 27.6 %
WBC: 8.3 10*3/uL (ref 3.8–10.8)

## 2020-09-18 LAB — COMPREHENSIVE METABOLIC PANEL
AG Ratio: 2.1 (calc) (ref 1.0–2.5)
ALT: 7 U/L — ABNORMAL LOW (ref 9–46)
AST: 15 U/L (ref 10–35)
Albumin: 4.2 g/dL (ref 3.6–5.1)
Alkaline phosphatase (APISO): 53 U/L (ref 35–144)
BUN: 15 mg/dL (ref 7–25)
CO2: 27 mmol/L (ref 20–32)
Calcium: 9.1 mg/dL (ref 8.6–10.3)
Chloride: 101 mmol/L (ref 98–110)
Creat: 1.04 mg/dL (ref 0.70–1.18)
Globulin: 2 g/dL (calc) (ref 1.9–3.7)
Glucose, Bld: 93 mg/dL (ref 65–99)
Potassium: 4.4 mmol/L (ref 3.5–5.3)
Sodium: 136 mmol/L (ref 135–146)
Total Bilirubin: 0.7 mg/dL (ref 0.2–1.2)
Total Protein: 6.2 g/dL (ref 6.1–8.1)

## 2020-09-18 LAB — LIPID PANEL
Cholesterol: 130 mg/dL (ref <200)
HDL: 37 mg/dL — ABNORMAL LOW (ref >=40)
LDL Cholesterol (Calc): 73 mg/dL (calc)
Non-HDL Cholesterol (Calc): 93 mg/dL (calc) (ref <130)
Total CHOL/HDL Ratio: 3.5 (calc) (ref <5.0)
Triglycerides: 114 mg/dL (ref <150)

## 2020-10-06 ENCOUNTER — Ambulatory Visit: Payer: Medicare Other | Admitting: Family Medicine

## 2021-03-17 ENCOUNTER — Encounter: Payer: Self-pay | Admitting: Nurse Practitioner

## 2021-03-17 ENCOUNTER — Other Ambulatory Visit: Payer: Self-pay

## 2021-03-17 ENCOUNTER — Ambulatory Visit (INDEPENDENT_AMBULATORY_CARE_PROVIDER_SITE_OTHER): Payer: Medicare Other | Admitting: Nurse Practitioner

## 2021-03-17 VITALS — BP 112/66 | HR 79 | Temp 97.6°F | Resp 16 | Ht 65.0 in | Wt 156.1 lb

## 2021-03-17 DIAGNOSIS — E7800 Pure hypercholesterolemia, unspecified: Secondary | ICD-10-CM | POA: Diagnosis not present

## 2021-03-17 DIAGNOSIS — Z23 Encounter for immunization: Secondary | ICD-10-CM

## 2021-03-17 DIAGNOSIS — D649 Anemia, unspecified: Secondary | ICD-10-CM

## 2021-03-17 DIAGNOSIS — R053 Chronic cough: Secondary | ICD-10-CM | POA: Diagnosis not present

## 2021-03-17 DIAGNOSIS — Z79899 Other long term (current) drug therapy: Secondary | ICD-10-CM

## 2021-03-17 DIAGNOSIS — G20 Parkinson's disease: Secondary | ICD-10-CM

## 2021-03-17 MED ORDER — ATORVASTATIN CALCIUM 20 MG PO TABS: 20.0000 mg | ORAL_TABLET | Freq: Every day | ORAL | 3 refills | Status: AC

## 2021-03-17 MED ORDER — OMEPRAZOLE 20 MG PO CPDR
20.0000 mg | DELAYED_RELEASE_CAPSULE | Freq: Every day | ORAL | 3 refills | Status: DC
Start: 2021-03-17 — End: 2021-05-18

## 2021-03-17 NOTE — Progress Notes (Signed)
BP 112/66   Pulse 79   Temp 97.6 F (36.4 C) (Oral)   Resp 16   Ht 5\' 5"  (1.651 m)   Wt 156 lb 1.6 oz (70.8 kg)   SpO2 97%   BMI 25.98 kg/m    Subjective:    Patient ID: Michael Stevens, male    DOB: [birth year], 80 y.o.   MRN: [id number]  HPI: Michael Stevens is a 80 y.o. male, her with wife  Chief Complaint  Patient presents with   Follow-up   Hyperlipidemia   Hypercholesteremia: He is currently taking atorvastatin 20 mg daily.  He denies any myalgia.  Last LDL was 73 on 09/17/20, will recheck today.   Parkinson's: Managed by Dr. Melrose Nakayama, neurology.  Last seen on 01/13/21. Currently taking sinemet 25/100 2 tablets four times a day.  Aricept 10 mg at night, namenda 5 mg in am, and trazodone 50 mg nightly.    Cough:  He has had a cough for six months.  Had a negative chest xray.  Has tried allergy medication and cough medication.  Will try omeprazole to see if it is related to GERD.   Relevant past medical, surgical, family and social history reviewed and updated as indicated. Interim medical history since our last visit reviewed. Allergies and medications reviewed and updated.  Review of Systems  Constitutional: Negative for fever or weight change.  Respiratory: Positive for cough, negative for shortness of breath.   Cardiovascular: Negative for chest pain or palpitations.  Gastrointestinal: Negative for abdominal pain, no bowel changes.  Musculoskeletal: Positive for gait problem, negative for joint swelling.  Skin: Negative for rash.  Neurological: Negative for dizziness or headache. No other specific complaints in a complete review of systems (except as listed in HPI above).     Objective:    BP 112/66   Pulse 79   Temp 97.6 F (36.4 C) (Oral)   Resp 16   Ht 5\' 5"  (1.651 m)   Wt 156 lb 1.6 oz (70.8 kg)   SpO2 97%   BMI 25.98 kg/m   Wt Readings from Last 3 Encounters:  03/17/21 156 lb 1.6 oz (70.8 kg)  09/17/20 166 lb 14.4 oz (75.7 kg)  04/30/20  166 lb 4.8 oz (75.4 kg)    Physical Exam  Constitutional: Patient appears well-developed and well-nourished.  No distress.  HEENT: head atraumatic, normocephalic, pupils equal and reactive to light neck supple Cardiovascular: Normal rate, regular rhythm and normal heart sounds.  No murmur heard. No BLE edema. Pulmonary/Chest: Effort normal and breath sounds normal. No respiratory distress. Abdominal: Soft.  There is no tenderness. Psychiatric: Patient has a normal mood and affect. behavior is normal. Judgment and thought content normal for patient  Results for orders placed or performed in visit on 09/17/20  Comprehensive metabolic panel  Result Value Ref Range   Glucose, Bld 93 65 - 99 mg/dL   BUN 15 7 - 25 mg/dL   Creat 1.04 0.70 - 1.18 mg/dL   BUN/Creatinine Ratio NOT APPLICABLE 6 - 22 (calc)   Sodium 136 135 - 146 mmol/L   Potassium 4.4 3.5 - 5.3 mmol/L   Chloride 101 98 - 110 mmol/L   CO2 27 20 - 32 mmol/L   Calcium 9.1 8.6 - 10.3 mg/dL   Total Protein 6.2 6.1 - 8.1 g/dL   Albumin 4.2 3.6 - 5.1 g/dL   Globulin 2.0 1.9 - 3.7 g/dL (calc)   AG Ratio 2.1 1.0 - 2.5 (calc)  Total Bilirubin 0.7 0.2 - 1.2 mg/dL   Alkaline phosphatase (APISO) 53 35 - 144 U/L   AST 15 10 - 35 U/L   ALT 7 (L) 9 - 46 U/L  Lipid panel  Result Value Ref Range   Cholesterol 130 <200 mg/dL   HDL 37 (L) > OR = 40 mg/dL   Triglycerides 114 <150 mg/dL   LDL Cholesterol (Calc) 73 mg/dL (calc)   Total CHOL/HDL Ratio 3.5 <5.0 (calc)   Non-HDL Cholesterol (Calc) 93 <130 mg/dL (calc)  CBC with Differential/Platelet  Result Value Ref Range   WBC 8.3 3.8 - 10.8 Thousand/uL   RBC 3.91 (L) 4.20 - 5.80 Million/uL   Hemoglobin 12.4 (L) 13.2 - 17.1 g/dL   HCT 36.6 (L) 38.5 - 50.0 %   MCV 93.6 80.0 - 100.0 fL   MCH 31.7 27.0 - 33.0 pg   MCHC 33.9 32.0 - 36.0 g/dL   RDW 16.8 (H) 11.0 - 15.0 %   Platelets 170 140 - 400 Thousand/uL   MPV 9.3 7.5 - 12.5 fL   Neutro Abs 4,781 1,500 - 7,800 cells/uL   Lymphs Abs  2,291 850 - 3,900 cells/uL   Absolute Monocytes 930 200 - 950 cells/uL   Eosinophils Absolute 232 15 - 500 cells/uL   Basophils Absolute 66 0 - 200 cells/uL   Neutrophils Relative % 57.6 %   Total Lymphocyte 27.6 %   Monocytes Relative 11.2 %   Eosinophils Relative 2.8 %   Basophils Relative 0.8 %      Assessment & Plan:   1. Hypercholesteremia  - atorvastatin (LIPITOR) 20 MG tablet; Take 1 tablet (20 mg total) by mouth daily.  Dispense: 90 tablet; Refill: 3 - Lipid panel - COMPLETE METABOLIC PANEL WITH GFR  2. Parkinson's disease ([place]) -continue current treatment plan  3. Medication management  - Lipid panel - CBC with Differential/Platelet - COMPLETE METABOLIC PANEL WITH GFR  4. Chronic cough  - omeprazole (PRILOSEC) 20 MG capsule; Take 1 capsule (20 mg total) by mouth daily.  Dispense: 30 capsule; Refill: 3  5. Anemia, unspecified type  - CBC with Differential/Platelet  6. Need for influenza vaccination  - Flu Vaccine QUAD High Dose(Fluad)   Follow up plan: No follow-ups on file.

## 2021-03-18 ENCOUNTER — Other Ambulatory Visit: Payer: Self-pay | Admitting: Nurse Practitioner

## 2021-03-18 DIAGNOSIS — D649 Anemia, unspecified: Secondary | ICD-10-CM

## 2021-03-19 LAB — COMPLETE METABOLIC PANEL WITH GFR
AG Ratio: 1.9 (calc) (ref 1.0–2.5)
ALT: 7 U/L — ABNORMAL LOW (ref 9–46)
AST: 15 U/L (ref 10–35)
Albumin: 4 g/dL (ref 3.6–5.1)
Alkaline phosphatase (APISO): 60 U/L (ref 35–144)
BUN: 22 mg/dL (ref 7–25)
CO2: 26 mmol/L (ref 20–32)
Calcium: 9.1 mg/dL (ref 8.6–10.3)
Chloride: 102 mmol/L (ref 98–110)
Creat: 1.19 mg/dL (ref 0.70–1.22)
Globulin: 2.1 g/dL (calc) (ref 1.9–3.7)
Glucose, Bld: 80 mg/dL (ref 65–99)
Potassium: 5.2 mmol/L (ref 3.5–5.3)
Sodium: 138 mmol/L (ref 135–146)
Total Bilirubin: 0.7 mg/dL (ref 0.2–1.2)
Total Protein: 6.1 g/dL (ref 6.1–8.1)
eGFR: 62 mL/min/{1.73_m2} (ref >=60)

## 2021-03-19 LAB — CBC WITH DIFFERENTIAL/PLATELET
Absolute Monocytes: 840 cells/uL (ref 200–950)
Basophils Absolute: 73 cells/uL (ref 0–200)
Basophils Relative: 1 %
Eosinophils Absolute: 248 cells/uL (ref 15–500)
Eosinophils Relative: 3.4 %
HCT: 36.1 % — ABNORMAL LOW (ref 38.5–50.0)
Hemoglobin: 12 g/dL — ABNORMAL LOW (ref 13.2–17.1)
Lymphs Abs: 2351 cells/uL (ref 850–3900)
MCH: 31.7 pg (ref 27.0–33.0)
MCHC: 33.2 g/dL (ref 32.0–36.0)
MCV: 95.3 fL (ref 80.0–100.0)
MPV: 10.2 fL (ref 7.5–12.5)
Monocytes Relative: 11.5 %
Neutro Abs: 3789 cells/uL (ref 1500–7800)
Neutrophils Relative %: 51.9 %
Platelets: 188 10*3/uL (ref 140–400)
RBC: 3.79 10*6/uL — ABNORMAL LOW (ref 4.20–5.80)
RDW: 17.3 % — ABNORMAL HIGH (ref 11.0–15.0)
Total Lymphocyte: 32.2 %
WBC: 7.3 10*3/uL (ref 3.8–10.8)

## 2021-03-19 LAB — IRON,TIBC AND FERRITIN PANEL
%SAT: 42 % (calc) (ref 20–48)
Ferritin: 121 ng/mL (ref 24–380)
Iron: 117 ug/dL (ref 50–180)
TIBC: 276 mcg/dL (calc) (ref 250–425)

## 2021-03-19 LAB — TEST AUTHORIZATION

## 2021-03-19 LAB — LIPID PANEL
Cholesterol: 197 mg/dL (ref <200)
HDL: 39 mg/dL — ABNORMAL LOW (ref >=40)
LDL Cholesterol (Calc): 133 mg/dL (calc) — ABNORMAL HIGH
Non-HDL Cholesterol (Calc): 158 mg/dL (calc) — ABNORMAL HIGH (ref <130)
Total CHOL/HDL Ratio: 5.1 (calc) — ABNORMAL HIGH (ref <5.0)
Triglycerides: 132 mg/dL (ref <150)

## 2021-03-19 LAB — VITAMIN B12: Vitamin B-12: 474 pg/mL (ref 200–1100)

## 2021-05-04 ENCOUNTER — Ambulatory Visit: Payer: Medicare Other

## 2021-05-04 ENCOUNTER — Ambulatory Visit (INDEPENDENT_AMBULATORY_CARE_PROVIDER_SITE_OTHER): Payer: Medicare Other

## 2021-05-04 VITALS — BP 98/60 | HR 86 | Temp 98.0°F | Resp 16 | Ht 65.0 in | Wt 160.0 lb

## 2021-05-04 DIAGNOSIS — Z0000 Encounter for general adult medical examination without abnormal findings: Secondary | ICD-10-CM | POA: Diagnosis not present

## 2021-05-04 NOTE — Progress Notes (Signed)
Subjective:   Michael Stevens is a 81 y.o. male who presents for Medicare Annual/Subsequent preventive examination.  Review of Systems     Cardiac Risk Factors include: advanced age (>75men, >57 women);dyslipidemia;male gender     Objective:    Today's Vitals   05/04/21 1343  BP: 98/60  Pulse: 86  Resp: 16  Temp: 98 F (36.7 C)  TempSrc: Oral  SpO2: 97%  Weight: 160 lb (72.6 kg)  Height: 5\' 5"  (1.651 m)   Body mass index is 26.63 kg/m.  Advanced Directives 05/04/2021 04/30/2020 04/16/2019 04/05/2018 06/22/2017 06/21/2017 06/21/2017  Does Patient Have a Medical Advance Directive? Yes Yes Yes Yes Yes Yes Yes  Type of Paramedic of [place];Living will [place];Living will [place];Living will Living will;Healthcare Power of Attorney Living will Living will Living will  Does patient want to make changes to medical advance directive? - - - No - Patient declined - No - Patient declined No - Patient declined  Copy of [place] in Chart? No - copy requested No - copy requested No - copy requested No - copy requested No - copy requested - -  Would patient like information on creating a medical advance directive? - - - - - - -  Pre-existing out of facility DNR order (yellow form or pink MOST form) - - - - - - -    Current Medications (verified) Outpatient Encounter Medications as of 05/04/2021  Medication Sig   atorvastatin (LIPITOR) 20 MG tablet Take 1 tablet (20 mg total) by mouth daily.   carbidopa-levodopa (SINEMET IR) 25-250 MG tablet Take 1 tablet by mouth 4 (four) times daily.   donepezil (ARICEPT) 10 MG tablet Take 1 tablet by mouth at bedtime.   latanoprost (XALATAN) 0.005 % ophthalmic solution Place 1 drop into both eyes at bedtime.   memantine (NAMENDA) 5 MG tablet Take 5 mg by mouth daily.   omeprazole (PRILOSEC) 20 MG capsule Take 1 capsule (20 mg total) by mouth daily.   traZODone (DESYREL) 50  MG tablet Take 50 mg by mouth at bedtime.   vitamin B-12 (CYANOCOBALAMIN) 1000 MCG tablet Take 1,000 mcg by mouth daily.   No facility-administered encounter medications on file as of 05/04/2021.    Allergies (verified) Patient has no known allergies.   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   Monitored by local ENT and [facility]   Mixed hyperlipidemia    Oral cancer ([place]) 09/03/2018   Parkinson's disease [facility])    Past Surgical History:  Procedure Laterality Date   BACK SURGERY  April 2011   COLONOSCOPY     ESOPHAGOGASTRODUODENOSCOPY N/A 06/22/2017   Procedure: ESOPHAGOGASTRODUODENOSCOPY (EGD);  Surgeon: Tol[place], Benay Pike, MD;  Location: [facility] ENDOSCOPY;  Service: Gastroenterology;  Laterality: N/A;   Family History  Problem Relation Age of Onset   Healthy Mother    Healthy Brother    Social History   Socioeconomic History   Marital status: Married    Spouse name: [name]  Number of children: 2   Years of education: Not on file   Highest education level: High school graduate  Occupational History   Occupation: Retired  Tobacco Use   Smoking status: Former    Packs/day: 2.00    Years: 30.00    Pack years: 60.00    Types: Cigarettes    Quit date: 12/01/1982    Years since quitting: 38.4   Smokeless tobacco: Never   Tobacco comments:    Quit 34 years ago  Vaping Use   Vaping Use: Never used  Substance and Sexual Activity   Alcohol use: Not Currently   Drug use: No   Sexual activity: Not Currently  Other Topics Concern   Not on file  Social History Narrative   Not on file   Social  Determinants of Health   Financial Resource Strain: Low Risk    Difficulty of Paying Living Expenses: Not hard at all  Food Insecurity: No Food Insecurity   Worried About Charity fundraiser in the Last Year: Never true   [place] in the Last Year: Never true  Transportation Needs: No Transportation Needs   Lack of Transportation (Medical): No   Lack of Transportation (Non-Medical): No  Physical Activity: Inactive   Days of Exercise per Week: 0 days   Minutes of Exercise per Session: 0 min  Stress: No Stress Concern Present   Feeling of Stress : Not at all  Social Connections: Moderately Isolated   Frequency of Communication with Friends and Family: Three times a week   Frequency of Social Gatherings with Friends and Family: More than three times a week   Attends Religious Services: Never   Marine scientist or Organizations: No   Attends Music therapist: Never   Marital Status: Married    Tobacco Counseling Counseling given: Not Answered Tobacco comments: Quit 34 years ago   Clinical Intake:  Pre-visit preparation completed: Yes  Pain : No/denies pain     BMI - recorded: 26.63 Nutritional Status: BMI 25 -29 Overweight Nutritional Risks: None Diabetes: No  How often do you need to have someone help you when you read instructions, pamphlets, or other written materials from your doctor or pharmacy?: 1 - Never    Interpreter Needed?: No  Information entered by :: Clemetine Marker LPN   Activities of Daily Living In your present state of health, do you have any difficulty performing the following activities: 05/04/2021 03/17/2021  Hearing? N N  Vision? N N  Difficulty concentrating or making decisions? Tempie Donning  Walking or climbing stairs? Y Y  Dressing or bathing? Y Y  Doing errands, shopping? Tempie Donning  Preparing Food and eating ? N -  Using the Toilet? N -  In the past six months, have you accidently leaked urine? Y -  Comment wears depends -  Do  you have problems with loss of bowel control? N -  Managing your Medications? Y -  Managing your Finances? Y -  Housekeeping or managing your Housekeeping? Y -  Some recent data might be hidden    Patient Care Team: Bo Merino, FNP as PCP - General (Nurse Practitioner) Anabel Bene, MD as Referring Physician (Neurology)  Indicate any recent Medical Services you may have received from other than [facility] providers in the past year (date may be approximate).     Assessment:   This is a routine wellness examination for [place].  Hearing/Vision screen Hearing Screening - Comments::  Pt denies hearing difficulty Vision Screening - Comments:: [facility] screen[facility] Dr. Wallace Going  Dietary issues and exercise activities discussed: Current Exercise Habits: The patient does not participate in regular exercise at present, Exercise limited by: neurologic condition(s);orthopedic condition(s)   Goals Addressed             This Visit's Progress    Prevent falls   On track    Recommend to remove items from home that may cause slips or trips.       Depression Screen PHQ 2/9 Scores 05/04/2021 03/17/2021 09/17/2020 04/30/2020 02/12/2020 04/16/2019 02/18/2019  PHQ - 2 Score 0 0 0 0 2 0 0  PHQ- 9 Score - 0 0 - - - 0    Fall Risk Fall Risk  05/04/2021 03/17/2021 09/17/2020 04/30/2020 04/16/2019  Falls in the past year? 1 1 0 0 0  Number falls in past yr: 1 1 0 0 0  Injury with Fall? 0 0 0 0 0  Risk for fall due to : Impaired balance/gait;Impaired mobility;History of fall(s) Impaired balance/gait - Impaired balance/gait Impaired balance/gait  Risk for fall due to: Comment - - - - -  Follow up Falls prevention discussed Falls prevention discussed - Falls prevention discussed Falls prevention discussed    FALL RISK PREVENTION PERTAINING TO THE HOME:  Any stairs in or around the home? No  If so, are there any without handrails? No  Home free of loose throw rugs in  walkways, pet beds, electrical cords, etc? Yes  Adequate lighting in your home to reduce risk of falls? Yes   ASSISTIVE DEVICES UTILIZED TO PREVENT FALLS:  Life alert? No  Use of a cane, walker or w/c? Yes  Grab bars in the bathroom? Yes  Shower chair or bench in shower? Yes  Elevated toilet seat or a handicapped toilet? Yes   TIMED UP AND GO:  Was the test performed? No . Pt remained in wheelchair  Cognitive Function: Cognitive status assessed by direct observation. Patient has current diagnosis of cognitive impairment. Patient is followed by neurology for ongoing assessment. Patient is unable to complete screening 6CIT or MMSE.       6CIT Screen 04/05/2018 03/24/2017 07/25/2016  What Year? 0 points 4 points 0 points  What month? 0 points 0 points 0 points  What time? 0 points 0 points 0 points  Count back from 20 0 points 0 points 0 points  Months in reverse 0 points 0 points 2 points  Repeat phrase 6 points 6 points 2 points  Total Score 6 10 4     Immunizations Immunization History  Administered Date(s) Administered   Fluad Quad(high Dose 65+) 03/17/2021   Influenza Inj Mdck Quad Pf 01/18/2019   Influenza, High Dose Seasonal PF 01/03/2018   Influenza-Unspecified 01/17/2015, 03/01/2016, 01/30/2020   Pneumococcal Conjugate-13 03/24/2017   Pneumococcal Polysaccharide-23 04/18/2010    TDAP status: Due, Education has been provided regarding the importance of this vaccine. Advised may receive this vaccine at local pharmacy or Health Dept. Aware to provide a copy of the vaccination record if obtained from local pharmacy or Health Dept. Verbalized acceptance and understanding.  Flu Vaccine status: Up to date  Pneumococcal vaccine status: Up to date  Covid-19 vaccine status: Completed vaccines  Qualifies for Shingles Vaccine? Yes   Zostavax completed No   Shingrix Completed?: No.    Education has been provided regarding the importance of this vaccine. Patient has been advised  to call insurance company to determine  out of pocket expense if they have not yet received this vaccine. Advised may also receive vaccine at local pharmacy or Health Dept. Verbalized acceptance and understanding.  Screening Tests Health Maintenance  Topic Date Due   COVID-19 Vaccine (1) Never done   Zoster Vaccines- Shingrix (1 of 2) Never done   TETANUS/TDAP  04/18/2026 (Originally 01/11/1960)   Pneumonia Vaccine 22+ Years old  Completed   INFLUENZA VACCINE  Completed   HPV VACCINES  Aged Out    Health Maintenance  Health Maintenance Due  Topic Date Due   COVID-19 Vaccine (1) Never done   Zoster Vaccines- Shingrix (1 of 2) Never done    Colorectal cancer screening: No longer required.   Lung Cancer Screening: (Low Dose CT Chest recommended if Age 43-80 years, 30 pack-year currently smoking OR have quit w/in 15years.) does not qualify.   Additional Screening:  Hepatitis C Screening: does not qualify  Vision Screening: Recommended annual ophthalmology exams for early detection of glaucoma and other disorders of the eye. Is the patient up to date with their annual eye exam?  Yes  Who is the provider or what is the name of the office in which the patient attends annual eye exams? [facility].   Dental Screening: Recommended annual dental exams for proper oral hygiene  Community Resource Referral / Chronic Care Management: CRR required this visit?  No   CCM required this visit?  No      Plan:     I have personally reviewed and noted the following in the patients chart:   Medical and social history Use of alcohol, tobacco or illicit drugs  Current medications and supplements including opioid prescriptions. Patient is not currently taking opioid prescriptions. Functional ability and status Nutritional status Physical activity Advanced directives List of other physicians Hospitalizations, surgeries, and ER visits in previous 12 months Vitals Screenings to  include cognitive, depression, and falls Referrals and appointments  In addition, I have reviewed and discussed with patient certain preventive protocols, quality metrics, and best practice recommendations. A written personalized care plan for preventive services as well as general preventive health recommendations were provided to patient.     Clemetine Marker, LPN   08/07/310   Nurse Notes: pt accompanied to visit today by his wife Michael Stevens who states patient's mobility is decreasing and he is in need of a wheelchair at home; they plan to discuss with Dr. Melrose Nakayama at his appt next week. Pt's wife aware CCM services available if needed for case management, pharmacy and social work.

## 2021-05-04 NOTE — Patient Instructions (Signed)
Michael Stevens , Thank you for taking time to come for your Medicare Wellness Visit. I appreciate your ongoing commitment to your health goals. Please review the following plan we discussed and let me know if I can assist you in the future.   Screening recommendations/referrals: Colonoscopy: no longer required Recommended yearly ophthalmology/optometry visit for glaucoma screening and checkup Recommended yearly dental visit for hygiene and checkup  Vaccinations: Influenza vaccine: done 03/17/21 Pneumococcal vaccine: done 03/24/17 Tdap vaccine: due Shingles vaccine: Shingrix discussed. Please contact your pharmacy for coverage information.  Covid-19:  please bring a copy of your vaccine card to your next appt  Advanced directives: Please bring a copy of your health care power of attorney and living will to the office at your convenience.   Conditions/risks identified: Recommend continuing fall prevention in the home  Next appointment: Follow up in one year for your annual wellness visit.   Preventive Care 75 Years and Older, Male Preventive care refers to lifestyle choices and visits with your health care provider that can promote health and wellness. What does preventive care include? A yearly physical exam. This is also called an annual well check. Dental exams once or twice a year. Routine eye exams. Ask your health care provider how often you should have your eyes checked. Personal lifestyle choices, including: Daily care of your teeth and gums. Regular physical activity. Eating a healthy diet. Avoiding tobacco and drug use. Limiting alcohol use. Practicing safe sex. Taking low doses of aspirin every day. Taking vitamin and mineral supplements as recommended by your health care provider. What happens during an annual well check? The services and screenings done by your health care provider during your annual well check will depend on your age, overall health, lifestyle risk  factors, and family history of disease. Counseling  Your health care provider may ask you questions about your: Alcohol use. Tobacco use. Drug use. Emotional well-being. Home and relationship well-being. Sexual activity. Eating habits. History of falls. Memory and ability to understand (cognition). Work and work Statistician. Screening  You may have the following tests or measurements: Height, weight, and BMI. Blood pressure. Lipid and cholesterol levels. These may be checked every 5 years, or more frequently if you are over 75 years old. Skin check. Lung cancer screening. You may have this screening every year starting at age 97 if you have a 30-pack-year history of smoking and currently smoke or have quit within the past 15 years. Fecal occult blood test (FOBT) of the stool. You may have this test every year starting at age 89. Flexible sigmoidoscopy or colonoscopy. You may have a sigmoidoscopy every 5 years or a colonoscopy every 10 years starting at age 8. Prostate cancer screening. Recommendations will vary depending on your family history and other risks. Hepatitis C blood test. Hepatitis B blood test. Sexually transmitted disease (STD) testing. Diabetes screening. This is done by checking your blood sugar (glucose) after you have not eaten for a while (fasting). You may have this done every 1-3 years. Abdominal aortic aneurysm (AAA) screening. You may need this if you are a current or former smoker. Osteoporosis. You may be screened starting at age 32 if you are at high risk. Talk with your health care provider about your test results, treatment options, and if necessary, the need for more tests. Vaccines  Your health care provider may recommend certain vaccines, such as: Influenza vaccine. This is recommended every year. Tetanus, diphtheria, and acellular pertussis (Tdap, Td) vaccine. You may need a  Td booster every 10 years. Zoster vaccine. You may need this after age  96. Pneumococcal 13-valent conjugate (PCV13) vaccine. One dose is recommended after age 66. Pneumococcal polysaccharide (PPSV23) vaccine. One dose is recommended after age 65. Talk to your health care provider about which screenings and vaccines you need and how often you need them. This information is not intended to replace advice given to you by your health care provider. Make sure you discuss any questions you have with your health care provider. Document Released: 05/01/2015 Document Revised: 12/23/2015 Document Reviewed: 02/03/2015 Elsevier Interactive Patient Education  2017 [employer] Prevention in the Home Falls can cause injuries. They can happen to people of all ages. There are many things you can do to make your home safe and to help prevent falls. What can I do on the outside of my home? Regularly fix the edges of walkways and driveways and fix any cracks. Remove anything that might make you trip as you walk through a door, such as a raised step or threshold. Trim any bushes or trees on the path to your home. Use bright outdoor lighting. Clear any walking paths of anything that might make someone trip, such as rocks or tools. Regularly check to see if handrails are loose or broken. Make sure that both sides of any steps have handrails. Any raised decks and porches should have guardrails on the edges. Have any leaves, snow, or ice cleared regularly. Use sand or salt on walking paths during winter. Clean up any spills in your garage right away. This includes oil or grease spills. What can I do in the bathroom? Use night lights. Install grab bars by the toilet and in the tub and shower. Do not use towel bars as grab bars. Use non-skid mats or decals in the tub or shower. If you need to sit down in the shower, use a plastic, non-slip stool. Keep the floor dry. Clean up any water that spills on the floor as soon as it happens. Remove soap buildup in the tub or shower  regularly. Attach bath mats securely with double-sided non-slip rug tape. Do not have throw rugs and other things on the floor that can make you trip. What can I do in the bedroom? Use night lights. Make sure that you have a light by your bed that is easy to reach. Do not use any sheets or blankets that are too big for your bed. They should not hang down onto the floor. Have a firm chair that has side arms. You can use this for support while you get dressed. Do not have throw rugs and other things on the floor that can make you trip. What can I do in the kitchen? Clean up any spills right away. Avoid walking on wet floors. Keep items that you use a lot in easy-to-reach places. If you need to reach something above you, use a strong step stool that has a grab bar. Keep electrical cords out of the way. Do not use floor polish or wax that makes floors slippery. If you must use wax, use non-skid floor wax. Do not have throw rugs and other things on the floor that can make you trip. What can I do with my stairs? Do not leave any items on the stairs. Make sure that there are handrails on both sides of the stairs and use them. Fix handrails that are broken or loose. Make sure that handrails are as long as the stairways. Check any  carpeting to make sure that it is firmly attached to the stairs. Fix any carpet that is loose or worn. Avoid having throw rugs at the top or bottom of the stairs. If you do have throw rugs, attach them to the floor with carpet tape. Make sure that you have a light switch at the top of the stairs and the bottom of the stairs. If you do not have them, ask someone to add them for you. What else can I do to help prevent falls? Wear shoes that: Do not have high heels. Have rubber bottoms. Are comfortable and fit you well. Are closed at the toe. Do not wear sandals. If you use a stepladder: Make sure that it is fully opened. Do not climb a closed stepladder. Make sure that  both sides of the stepladder are locked into place. Ask someone to hold it for you, if possible. Clearly mark and make sure that you can see: Any grab bars or handrails. First and last steps. Where the edge of each step is. Use tools that help you move around (mobility aids) if they are needed. These include: Canes. Walkers. Scooters. Crutches. Turn on the lights when you go into a dark area. Replace any light bulbs as soon as they burn out. Set up your furniture so you have a clear path. Avoid moving your furniture around. If any of your floors are uneven, fix them. If there are any pets around you, be aware of where they are. Review your medicines with your doctor. Some medicines can make you feel dizzy. This can increase your chance of falling. Ask your doctor what other things that you can do to help prevent falls. This information is not intended to replace advice given to you by your health care provider. Make sure you discuss any questions you have with your health care provider. Document Released: 01/29/2009 Document Revised: 09/10/2015 Document Reviewed: 05/09/2014 Elsevier Interactive Patient Education  2017 [employer].

## 2021-05-18 ENCOUNTER — Other Ambulatory Visit: Payer: Self-pay | Admitting: Nurse Practitioner

## 2021-05-18 DIAGNOSIS — R053 Chronic cough: Secondary | ICD-10-CM

## 2021-05-18 NOTE — Telephone Encounter (Signed)
Requested Prescriptions  Pending Prescriptions Disp Refills   omeprazole (PRILOSEC) 20 MG capsule [Pharmacy Med Name: Omeprazole 20 MG Oral Capsule Delayed Release] 90 capsule 3    Sig: TAKE 1 CAPSULE BY MOUTH DAILY     Gastroenterology: Proton Pump Inhibitors Passed - 05/18/2021  4:38 AM      Passed - Valid encounter within last 12 months    Recent Outpatient Visits          2 months ago [place], FNP   8 months ago Ankle swelling, unspecified laterality   [facility] Kathrine Haddock, NP   1 year ago [facility] Towanda Malkin, MD   2 years ago Annual physical exam   [place], [place]   2 years ago Erroneous encounter - disregard   [place], NP      Future Appointments            In 4 months Reece Packer, Myna Hidalgo, [facility], [facility]

## 2021-09-15 ENCOUNTER — Ambulatory Visit (INDEPENDENT_AMBULATORY_CARE_PROVIDER_SITE_OTHER): Payer: Medicare Other | Admitting: Nurse Practitioner

## 2021-09-15 ENCOUNTER — Encounter: Payer: Self-pay | Admitting: Nurse Practitioner

## 2021-09-15 ENCOUNTER — Other Ambulatory Visit: Payer: Self-pay

## 2021-09-15 VITALS — BP 122/68 | HR 86 | Temp 98.3°F | Resp 16 | Ht 65.0 in | Wt 160.0 lb

## 2021-09-15 DIAGNOSIS — D649 Anemia, unspecified: Secondary | ICD-10-CM

## 2021-09-15 DIAGNOSIS — R053 Chronic cough: Secondary | ICD-10-CM

## 2021-09-15 DIAGNOSIS — G20 Parkinson's disease: Secondary | ICD-10-CM

## 2021-09-15 DIAGNOSIS — K219 Gastro-esophageal reflux disease without esophagitis: Secondary | ICD-10-CM

## 2021-09-15 DIAGNOSIS — I10 Essential (primary) hypertension: Secondary | ICD-10-CM | POA: Diagnosis not present

## 2021-09-15 DIAGNOSIS — E7800 Pure hypercholesterolemia, unspecified: Secondary | ICD-10-CM | POA: Diagnosis not present

## 2021-09-15 DIAGNOSIS — R413 Other amnesia: Secondary | ICD-10-CM

## 2021-09-15 DIAGNOSIS — R251 Tremor, unspecified: Secondary | ICD-10-CM

## 2021-09-15 DIAGNOSIS — G629 Polyneuropathy, unspecified: Secondary | ICD-10-CM

## 2021-09-15 NOTE — Assessment & Plan Note (Signed)
Patient currently taking omeprazole 20 mg daily.  Patient reports his cough has gotten much better.

## 2021-09-15 NOTE — Assessment & Plan Note (Signed)
Continue current treatment.  Keep follow-up appointments with neurology.

## 2021-09-15 NOTE — Progress Notes (Signed)
BP 122/68   Pulse 86   Temp 98.3 F (36.8 C) (Oral)   Resp 16   Ht '5\' 5"'  (1.651 m)   Wt 160 lb (72.6 kg)   SpO2 98%   BMI 26.63 kg/m    Subjective:    Patient ID: Michael Stevens, male    DOB: October 02, 1940, 81 y.o.   MRN: [id number]  HPI: Michael Stevens is a 81 y.o. male, here with wife  Chief Complaint  Patient presents with   Hyperlipidemia   Gastroesophageal Reflux    6 month follow up   Hypercholesteremia: He is currently taking atorvastatin 20 mg daily.  His last LDL was 133 on 03/17/2021.  After reaching out to patient patient had not been taking his atorvastatin every day.  He has since been taking it every day.  He denies any myalgia.  We will get labs today.   Parkinson's/memory loss./tremors/neuropathy: Managed by Dr. Melrose Nakayama, neurology.  He last saw neurology on 05/25/2021.  He is currently taking sinemet 25/100 2 tablets four times a day.  Aricept 10 mg at night, namenda was increased to 10 mg in am, and trazodone 50 mg nightly.  He says he is doing well.  His wife also reports that he is doing basically the same.   Cough/GERD:  He has had a cough for six months.  Had a negative chest xray.  Has tried allergy medication and cough medication.  At last visit we tried starting omeprazole to see if that helps with his cough.  Wife reports that patient's cough has improved.  We will continue with current treatment.  Anemia: Reports no abnormal bleeding.  His last few blood draws his hemoglobin hematocrit were low normal.  Did an iron panel which was normal.  No concerns at this time.  Hypertension: His blood pressure today was 122/68.  Patient denies any chest pain, shortness of breath, headaches or blurred vision.  Is not currently taking any blood pressure medication.  Relevant past medical, surgical, family and social history reviewed and updated as indicated. Interim medical history since our last visit reviewed. Allergies and medications reviewed and updated.  Review of  Systems  Constitutional: Negative for fever or weight change.  Respiratory: Positive for cough and negative for shortness of breath.   Cardiovascular: Negative for chest pain or palpitations.  Gastrointestinal: Negative for abdominal pain, no bowel changes.  Musculoskeletal: Negative for gait problem or joint swelling.  Skin: Negative for rash.  Neurological: Negative for dizziness or headache.  No other specific complaints in a complete review of systems (except as listed in HPI above).      Objective:    BP 122/68   Pulse 86   Temp 98.3 F (36.8 C) (Oral)   Resp 16   Ht '5\' 5"'  (1.651 m)   Wt 160 lb (72.6 kg)   SpO2 98%   BMI 26.63 kg/m   Wt Readings from Last 3 Encounters:  09/15/21 160 lb (72.6 kg)  05/04/21 160 lb (72.6 kg)  03/17/21 156 lb 1.6 oz (70.8 kg)    Physical Exam  Constitutional: Patient appears well-developed and well-nourished.  No distress.  HEENT: head atraumatic, normocephalic, pupils equal and reactive to light, neck supple Cardiovascular: Normal rate, regular rhythm and normal heart sounds.  No murmur heard. No BLE edema. Pulmonary/Chest: Effort normal and breath sounds normal. No respiratory distress. Abdominal: Soft.  There is no tenderness. Psychiatric: Patient has a normal mood and affect. behavior is normal. Judgment and  thought content normal.  Results for orders placed or performed in visit on 03/17/21  Lipid panel  Result Value Ref Range   Cholesterol 197 <200 mg/dL   HDL 39 (L) > OR = 40 mg/dL   Triglycerides 132 <150 mg/dL   LDL Cholesterol (Calc) 133 (H) mg/dL (calc)   Total CHOL/HDL Ratio 5.1 (H) <5.0 (calc)   Non-HDL Cholesterol (Calc) 158 (H) <130 mg/dL (calc)  CBC with Differential/Platelet  Result Value Ref Range   WBC 7.3 3.8 - 10.8 Thousand/uL   RBC 3.79 (L) 4.20 - 5.80 Million/uL   Hemoglobin 12.0 (L) 13.2 - 17.1 g/dL   HCT 36.1 (L) 38.5 - 50.0 %   MCV 95.3 80.0 - 100.0 fL   MCH 31.7 27.0 - 33.0 pg   MCHC 33.2 32.0 - 36.0  g/dL   RDW 17.3 (H) 11.0 - 15.0 %   Platelets 188 140 - 400 Thousand/uL   MPV 10.2 7.5 - 12.5 fL   Neutro Abs 3,789 1,500 - 7,800 cells/uL   Lymphs Abs 2,351 850 - 3,900 cells/uL   Absolute Monocytes 840 200 - 950 cells/uL   Eosinophils Absolute 248 15 - 500 cells/uL   Basophils Absolute 73 0 - 200 cells/uL   Neutrophils Relative % 51.9 %   Total Lymphocyte 32.2 %   Monocytes Relative 11.5 %   Eosinophils Relative 3.4 %   Basophils Relative 1.0 %  COMPLETE METABOLIC PANEL WITH GFR  Result Value Ref Range   Glucose, Bld 80 65 - 99 mg/dL   BUN 22 7 - 25 mg/dL   Creat 1.19 0.70 - 1.22 mg/dL   eGFR 62 > OR = 60 mL/min/1.29m   BUN/Creatinine Ratio NOT APPLICABLE 6 - 22 (calc)   Sodium 138 135 - 146 mmol/L   Potassium 5.2 3.5 - 5.3 mmol/L   Chloride 102 98 - 110 mmol/L   CO2 26 20 - 32 mmol/L   Calcium 9.1 8.6 - 10.3 mg/dL   Total Protein 6.1 6.1 - 8.1 g/dL   Albumin 4.0 3.6 - 5.1 g/dL   Globulin 2.1 1.9 - 3.7 g/dL (calc)   AG Ratio 1.9 1.0 - 2.5 (calc)   Total Bilirubin 0.7 0.2 - 1.2 mg/dL   Alkaline phosphatase (APISO) 60 35 - 144 U/L   AST 15 10 - 35 U/L   ALT 7 (L) 9 - 46 U/L  Iron, TIBC and Ferritin Panel  Result Value Ref Range   Iron 117 50 - 180 mcg/dL   TIBC 276 250 - 425 mcg/dL (calc)   %SAT 42 20 - 48 % (calc)   Ferritin 121 24 - 380 ng/mL  Vitamin B12  Result Value Ref Range   Vitamin B-12 474 200 - 1,100 pg/mL  TEST AUTHORIZATION  Result Value Ref Range   TEST NAME: IRON, TIBC AND FERRITIN PANEL    TEST CODE: [id number]    CLIENT CONTACT: Emric Kowalewski    REPORT ALWAYS MESSAGE SIGNATURE        Assessment & Plan:   Problem List Items Addressed This Visit       Digestive   Gastroesophageal reflux disease    Patient currently taking omeprazole 20 mg daily.  Patient reports his cough has gotten much better.         Nervous and Auditory   Neuropathy    Continue current treatment.  Keep follow-up appointments with neurology.        Parkinson's disease ([place]    Continue current treatment.  Keep  follow-up appointments with neurology.         Other   Hypercholesteremia - Primary (Chronic)   Relevant Orders   Lipid panel   COMPLETE METABOLIC PANEL WITH GFR   Anemia    Patient's last H&H was below normal.  Patient denies any abnormal bleeding.  Iron panel normal.  No concerns at this time.       Tremor    Continue current treatment.  Keep follow-up appointments with neurology.       Memory loss, short term    Continue current treatment.  Keep follow-up appointments with neurology.       Chronic cough    Patient currently taking omeprazole 20 mg daily.  Patient reports his cough has gotten much better.       Other Visit Diagnoses     Essential (primary) hypertension       Blood pressure is at goal patient no longer on blood pressure medication.        Follow up plan: Return in about 6 months (around 03/17/2022) for follow up.

## 2021-09-15 NOTE — Assessment & Plan Note (Signed)
Patient's last H&H was below normal.  Patient denies any abnormal bleeding.  Iron panel normal.  No concerns at this time.

## 2021-09-16 LAB — COMPLETE METABOLIC PANEL WITH GFR
AG Ratio: 1.7 (calc) (ref 1.0–2.5)
ALT: 7 U/L — ABNORMAL LOW (ref 9–46)
AST: 12 U/L (ref 10–35)
Albumin: 4 g/dL (ref 3.6–5.1)
Alkaline phosphatase (APISO): 64 U/L (ref 35–144)
BUN: 16 mg/dL (ref 7–25)
CO2: 26 mmol/L (ref 20–32)
Calcium: 8.9 mg/dL (ref 8.6–10.3)
Chloride: 106 mmol/L (ref 98–110)
Creat: 1.19 mg/dL (ref 0.70–1.22)
Globulin: 2.3 g/dL (calc) (ref 1.9–3.7)
Glucose, Bld: 95 mg/dL (ref 65–99)
Potassium: 4.7 mmol/L (ref 3.5–5.3)
Sodium: 139 mmol/L (ref 135–146)
Total Bilirubin: 0.7 mg/dL (ref 0.2–1.2)
Total Protein: 6.3 g/dL (ref 6.1–8.1)
eGFR: 62 mL/min/{1.73_m2} (ref >=60)

## 2021-09-16 LAB — LIPID PANEL
Cholesterol: 191 mg/dL (ref <200)
HDL: 37 mg/dL — ABNORMAL LOW (ref >=40)
LDL Cholesterol (Calc): 134 mg/dL (calc) — ABNORMAL HIGH
Non-HDL Cholesterol (Calc): 154 mg/dL (calc) — ABNORMAL HIGH (ref <130)
Total CHOL/HDL Ratio: 5.2 (calc) — ABNORMAL HIGH (ref <5.0)
Triglycerides: 98 mg/dL (ref <150)

## 2021-09-30 ENCOUNTER — Encounter: Payer: Self-pay | Admitting: Emergency Medicine

## 2021-09-30 ENCOUNTER — Emergency Department: Payer: Medicare Other

## 2021-09-30 ENCOUNTER — Inpatient Hospital Stay: Payer: Medicare Other | Admitting: Anesthesiology

## 2021-09-30 ENCOUNTER — Other Ambulatory Visit: Payer: Self-pay

## 2021-09-30 ENCOUNTER — Inpatient Hospital Stay
Admission: EM | Admit: 2021-09-30 | Discharge: 2021-10-05 | DRG: 522 | Disposition: A | Discharge status: Skilled Nursing Facility | Payer: Medicare Other | Attending: Internal Medicine | Admitting: Internal Medicine

## 2021-09-30 ENCOUNTER — Inpatient Hospital Stay: Payer: Medicare Other

## 2021-09-30 ENCOUNTER — Encounter
Admission: EM | Disposition: A | Discharge status: Skilled Nursing Facility | Payer: Self-pay | Source: Home / Self Care | Attending: Internal Medicine

## 2021-09-30 DIAGNOSIS — N390 Urinary tract infection, site not specified: Secondary | ICD-10-CM

## 2021-09-30 DIAGNOSIS — M479 Spondylosis, unspecified: Secondary | ICD-10-CM | POA: Diagnosis present

## 2021-09-30 DIAGNOSIS — Z8711 Personal history of peptic ulcer disease: Secondary | ICD-10-CM | POA: Diagnosis not present

## 2021-09-30 DIAGNOSIS — S72009A Fracture of unspecified part of neck of unspecified femur, initial encounter for closed fracture: Secondary | ICD-10-CM | POA: Diagnosis present

## 2021-09-30 DIAGNOSIS — F0280 Dementia in other diseases classified elsewhere without behavioral disturbance: Secondary | ICD-10-CM | POA: Diagnosis present

## 2021-09-30 DIAGNOSIS — G20 Parkinson's disease: Secondary | ICD-10-CM | POA: Diagnosis present

## 2021-09-30 DIAGNOSIS — W08XXXA Fall from other furniture, initial encounter: Secondary | ICD-10-CM | POA: Diagnosis present

## 2021-09-30 DIAGNOSIS — S72001A Fracture of unspecified part of neck of right femur, initial encounter for closed fracture: Secondary | ICD-10-CM | POA: Diagnosis not present

## 2021-09-30 DIAGNOSIS — Z79899 Other long term (current) drug therapy: Secondary | ICD-10-CM

## 2021-09-30 DIAGNOSIS — E7800 Pure hypercholesterolemia, unspecified: Secondary | ICD-10-CM | POA: Diagnosis present

## 2021-09-30 DIAGNOSIS — H409 Unspecified glaucoma: Secondary | ICD-10-CM | POA: Diagnosis present

## 2021-09-30 DIAGNOSIS — M25551 Pain in right hip: Secondary | ICD-10-CM | POA: Diagnosis present

## 2021-09-30 DIAGNOSIS — K219 Gastro-esophageal reflux disease without esophagitis: Secondary | ICD-10-CM | POA: Diagnosis present

## 2021-09-30 DIAGNOSIS — W19XXXA Unspecified fall, initial encounter: Principal | ICD-10-CM

## 2021-09-30 DIAGNOSIS — S72011A Unspecified intracapsular fracture of right femur, initial encounter for closed fracture: Principal | ICD-10-CM | POA: Diagnosis present

## 2021-09-30 DIAGNOSIS — M5441 Lumbago with sciatica, right side: Secondary | ICD-10-CM | POA: Diagnosis present

## 2021-09-30 DIAGNOSIS — Z87891 Personal history of nicotine dependence: Secondary | ICD-10-CM | POA: Diagnosis not present

## 2021-09-30 DIAGNOSIS — N529 Male erectile dysfunction, unspecified: Secondary | ICD-10-CM | POA: Diagnosis present

## 2021-09-30 DIAGNOSIS — Z85819 Personal history of malignant neoplasm of unspecified site of lip, oral cavity, and pharynx: Secondary | ICD-10-CM | POA: Diagnosis not present

## 2021-09-30 DIAGNOSIS — E782 Mixed hyperlipidemia: Secondary | ICD-10-CM | POA: Diagnosis present

## 2021-09-30 DIAGNOSIS — Z66 Do not resuscitate: Secondary | ICD-10-CM | POA: Diagnosis present

## 2021-09-30 DIAGNOSIS — Y92009 Unspecified place in unspecified non-institutional (private) residence as the place of occurrence of the external cause: Secondary | ICD-10-CM

## 2021-09-30 DIAGNOSIS — D509 Iron deficiency anemia, unspecified: Secondary | ICD-10-CM | POA: Diagnosis present

## 2021-09-30 DIAGNOSIS — I10 Essential (primary) hypertension: Secondary | ICD-10-CM | POA: Diagnosis present

## 2021-09-30 HISTORY — PX: HIP ARTHROPLASTY: SHX981

## 2021-09-30 LAB — COMPREHENSIVE METABOLIC PANEL
ALT: 17 U/L (ref 0–44)
AST: 19 U/L (ref 15–41)
Albumin: 3.6 g/dL (ref 3.5–5.0)
Alkaline Phosphatase: 67 U/L (ref 38–126)
Anion gap: 7 (ref 5–15)
BUN: 22 mg/dL (ref 8–23)
CO2: 22 mmol/L (ref 22–32)
Calcium: 8.9 mg/dL (ref 8.9–10.3)
Chloride: 104 mmol/L (ref 98–111)
Creatinine, Ser: 1.08 mg/dL (ref 0.61–1.24)
GFR, Estimated: >60 mL/min (ref >60)
Glucose, Bld: 142 mg/dL — ABNORMAL HIGH (ref 70–99)
Potassium: 3.9 mmol/L (ref 3.5–5.1)
Sodium: 133 mmol/L — ABNORMAL LOW (ref 135–145)
Total Bilirubin: 1.2 mg/dL (ref 0.3–1.2)
Total Protein: 6.8 g/dL (ref 6.5–8.1)

## 2021-09-30 LAB — URINALYSIS, ROUTINE W REFLEX MICROSCOPIC
Bacteria, UA: NONE SEEN
Bilirubin Urine: NEGATIVE
Glucose, UA: NEGATIVE mg/dL
Ketones, ur: NEGATIVE mg/dL
Nitrite: NEGATIVE
Protein, ur: NEGATIVE mg/dL
Specific Gravity, Urine: 1.013 (ref 1.005–1.030)
WBC, UA: >50 WBC/hpf — ABNORMAL HIGH (ref 0–5)
pH: 6 (ref 5.0–8.0)

## 2021-09-30 LAB — CBC WITH DIFFERENTIAL/PLATELET
Abs Immature Granulocytes: 0.12 10*3/uL — ABNORMAL HIGH (ref 0.00–0.07)
Basophils Absolute: 0.1 10*3/uL (ref 0.0–0.1)
Basophils Relative: 1 %
Eosinophils Absolute: 0.1 10*3/uL (ref 0.0–0.5)
Eosinophils Relative: 1 %
HCT: 32.7 % — ABNORMAL LOW (ref 39.0–52.0)
Hemoglobin: 10.9 g/dL — ABNORMAL LOW (ref 13.0–17.0)
Immature Granulocytes: 1 %
Lymphocytes Relative: 9 %
Lymphs Abs: 1.4 10*3/uL (ref 0.7–4.0)
MCH: 30.4 pg (ref 26.0–34.0)
MCHC: 33.3 g/dL (ref 30.0–36.0)
MCV: 91.3 fL (ref 80.0–100.0)
Monocytes Absolute: 1.4 10*3/uL — ABNORMAL HIGH (ref 0.1–1.0)
Monocytes Relative: 9 %
Neutro Abs: 12.3 10*3/uL — ABNORMAL HIGH (ref 1.7–7.7)
Neutrophils Relative %: 79 %
Platelets: 243 10*3/uL (ref 150–400)
RBC: 3.58 MIL/uL — ABNORMAL LOW (ref 4.22–5.81)
RDW: 18.3 % — ABNORMAL HIGH (ref 11.5–15.5)
WBC: 15.4 10*3/uL — ABNORMAL HIGH (ref 4.0–10.5)
nRBC: 0 % (ref 0.0–0.2)

## 2021-09-30 LAB — RETICULOCYTES
Immature Retic Fract: 13.5 % (ref 2.3–15.9)
RBC.: 3.32 MIL/uL — ABNORMAL LOW (ref 4.22–5.81)
Retic Count, Absolute: 40.2 10*3/uL (ref 19.0–186.0)
Retic Ct Pct: 1.2 % (ref 0.4–3.1)

## 2021-09-30 LAB — VITAMIN B12: Vitamin B-12: 438 pg/mL (ref 180–914)

## 2021-09-30 LAB — IRON AND TIBC
Iron: 28 ug/dL — ABNORMAL LOW (ref 45–182)
Saturation Ratios: 13 % — ABNORMAL LOW (ref 17.9–39.5)
TIBC: 218 ug/dL — ABNORMAL LOW (ref 250–450)
UIBC: 190 ug/dL

## 2021-09-30 LAB — TYPE AND SCREEN
ABO/RH(D): O NEG
Antibody Screen: NEGATIVE

## 2021-09-30 LAB — FERRITIN: Ferritin: 261 ng/mL (ref 24–336)

## 2021-09-30 LAB — FOLATE: Folate: 8.9 ng/mL (ref >5.9)

## 2021-09-30 SURGERY — HEMIARTHROPLASTY, HIP, DIRECT ANTERIOR APPROACH, FOR FRACTURE
Anesthesia: Spinal | Site: Hip | Laterality: Right

## 2021-09-30 MED ORDER — SODIUM CHLORIDE 0.9 % IV SOLN
1.0000 g | Freq: Once | INTRAVENOUS | Status: AC
  Administered 2021-09-30: 1 g via INTRAVENOUS
  Filled 2021-09-30: qty 10

## 2021-09-30 MED ORDER — VANCOMYCIN HCL 1000 MG IV SOLR
INTRAVENOUS | Status: AC
  Filled 2021-09-30: qty 20

## 2021-09-30 MED ORDER — METOCLOPRAMIDE HCL 5 MG/ML IJ SOLN: 5.0000 mg | Freq: Three times a day (TID) | INTRAMUSCULAR | Status: DC | PRN

## 2021-09-30 MED ORDER — SODIUM CHLORIDE 0.9 % IR SOLN
Status: DC | PRN
  Administered 2021-09-30: 1004 mL

## 2021-09-30 MED ORDER — ADULT MULTIVITAMIN W/MINERALS CH
1.0000 | ORAL_TABLET | Freq: Every day | ORAL | Status: DC
  Administered 2021-10-01 – 2021-10-05 (×5): 1 via ORAL
  Filled 2021-09-30 (×5): qty 1

## 2021-09-30 MED ORDER — ALBUMIN HUMAN 5 % IV SOLN
INTRAVENOUS | Status: AC
  Filled 2021-09-30: qty 250

## 2021-09-30 MED ORDER — METOCLOPRAMIDE HCL 5 MG PO TABS: 5.0000 mg | ORAL_TABLET | Freq: Three times a day (TID) | ORAL | Status: DC | PRN

## 2021-09-30 MED ORDER — PHENYLEPHRINE HCL (PRESSORS) 10 MG/ML IV SOLN
INTRAVENOUS | Status: DC | PRN
  Administered 2021-09-30: 80 ug via INTRAVENOUS

## 2021-09-30 MED ORDER — ACETAMINOPHEN 10 MG/ML IV SOLN
INTRAVENOUS | Status: AC
  Filled 2021-09-30: qty 100

## 2021-09-30 MED ORDER — FENTANYL CITRATE (PF) 100 MCG/2ML IJ SOLN: 25.0000 ug | INTRAMUSCULAR | Status: DC | PRN

## 2021-09-30 MED ORDER — GLYCOPYRROLATE 0.2 MG/ML IJ SOLN
INTRAMUSCULAR | Status: AC
  Filled 2021-09-30: qty 1

## 2021-09-30 MED ORDER — VASOPRESSIN 20 UNIT/ML IV SOLN
INTRAVENOUS | Status: AC
  Filled 2021-09-30: qty 1

## 2021-09-30 MED ORDER — VANCOMYCIN HCL 1 G IV SOLR
INTRAVENOUS | Status: DC | PRN
  Administered 2021-09-30: 1000 mg via TOPICAL

## 2021-09-30 MED ORDER — CEFAZOLIN SODIUM-DEXTROSE 2-4 GM/100ML-% IV SOLN
2.0000 g | Freq: Four times a day (QID) | INTRAVENOUS | Status: AC
  Administered 2021-09-30 – 2021-10-01 (×2): 2 g via INTRAVENOUS
  Filled 2021-09-30 (×2): qty 100

## 2021-09-30 MED ORDER — CEFAZOLIN SODIUM-DEXTROSE 1-4 GM/50ML-% IV SOLN
1.0000 g | Freq: Once | INTRAVENOUS | Status: DC
  Filled 2021-09-30: qty 50

## 2021-09-30 MED ORDER — ONDANSETRON HCL 4 MG/2ML IJ SOLN: 4.0000 mg | Freq: Four times a day (QID) | INTRAMUSCULAR | Status: DC | PRN

## 2021-09-30 MED ORDER — SUGAMMADEX SODIUM 200 MG/2ML IV SOLN
INTRAVENOUS | Status: DC | PRN
  Administered 2021-09-30: 200 mg via INTRAVENOUS

## 2021-09-30 MED ORDER — LIDOCAINE HCL (PF) 2 % IJ SOLN
INTRAMUSCULAR | Status: AC
  Filled 2021-09-30: qty 5

## 2021-09-30 MED ORDER — ENSURE ENLIVE PO LIQD
237.0000 mL | Freq: Three times a day (TID) | ORAL | Status: DC
  Administered 2021-10-01: 237 mL via ORAL

## 2021-09-30 MED ORDER — PROPOFOL 1000 MG/100ML IV EMUL
INTRAVENOUS | Status: AC
  Filled 2021-09-30: qty 100

## 2021-09-30 MED ORDER — BUPIVACAINE-EPINEPHRINE (PF) 0.5% -1:200000 IJ SOLN
INTRAMUSCULAR | Status: AC
  Filled 2021-09-30: qty 30

## 2021-09-30 MED ORDER — CEFAZOLIN SODIUM-DEXTROSE 2-4 GM/100ML-% IV SOLN
INTRAVENOUS | Status: AC
  Filled 2021-09-30: qty 100

## 2021-09-30 MED ORDER — CARBIDOPA-LEVODOPA 25-250 MG PO TABS
1.0000 | ORAL_TABLET | Freq: Four times a day (QID) | ORAL | Status: DC
  Administered 2021-09-30 – 2021-10-05 (×21): 1 via ORAL
  Filled 2021-09-30 (×24): qty 1

## 2021-09-30 MED ORDER — ROCURONIUM BROMIDE 100 MG/10ML IV SOLN
INTRAVENOUS | Status: DC | PRN
  Administered 2021-09-30: 20 mg via INTRAVENOUS
  Administered 2021-09-30 (×2): 10 mg via INTRAVENOUS
  Administered 2021-09-30: 50 mg via INTRAVENOUS

## 2021-09-30 MED ORDER — SODIUM CHLORIDE 0.9 % IR SOLN
Status: DC | PRN
  Administered 2021-09-30: 3012 mL

## 2021-09-30 MED ORDER — CEFAZOLIN SODIUM-DEXTROSE 2-4 GM/100ML-% IV SOLN
2.0000 g | INTRAVENOUS | Status: AC
  Administered 2021-09-30: 2 g via INTRAVENOUS

## 2021-09-30 MED ORDER — EPHEDRINE SULFATE (PRESSORS) 50 MG/ML IJ SOLN
INTRAMUSCULAR | Status: DC | PRN
  Administered 2021-09-30 (×2): 10 mg via INTRAVENOUS

## 2021-09-30 MED ORDER — MUPIROCIN 2 % EX OINT
1.0000 "application " | TOPICAL_OINTMENT | Freq: Two times a day (BID) | CUTANEOUS | Status: AC
  Administered 2021-09-30 – 2021-10-04 (×10): 1 via NASAL
  Filled 2021-09-30: qty 22

## 2021-09-30 MED ORDER — TRANEXAMIC ACID 1000 MG/10ML IV SOLN
INTRAVENOUS | Status: AC
  Filled 2021-09-30: qty 20

## 2021-09-30 MED ORDER — ALBUMIN HUMAN 5 % IV SOLN: INTRAVENOUS | Status: DC | PRN

## 2021-09-30 MED ORDER — FENTANYL CITRATE (PF) 100 MCG/2ML IJ SOLN
INTRAMUSCULAR | Status: DC | PRN
Start: 2021-09-30 — End: 2021-09-30
  Administered 2021-09-30 (×2): 25 ug via INTRAVENOUS
  Administered 2021-09-30: 50 ug via INTRAVENOUS

## 2021-09-30 MED ORDER — PROPOFOL 10 MG/ML IV BOLUS
INTRAVENOUS | Status: DC | PRN
  Administered 2021-09-30: 100 mg via INTRAVENOUS

## 2021-09-30 MED ORDER — ACETAMINOPHEN 10 MG/ML IV SOLN
INTRAVENOUS | Status: DC | PRN
  Administered 2021-09-30: 1000 mg via INTRAVENOUS

## 2021-09-30 MED ORDER — HYDROCODONE-ACETAMINOPHEN 5-325 MG PO TABS
1.0000 | ORAL_TABLET | ORAL | Status: DC | PRN
  Administered 2021-09-30: 1 via ORAL
  Administered 2021-10-02: 2 via ORAL
  Administered 2021-10-04: 1 via ORAL
  Administered 2021-10-04: 2 via ORAL
  Filled 2021-09-30: qty 2
  Filled 2021-09-30: qty 1
  Filled 2021-09-30: qty 2
  Filled 2021-09-30: qty 1

## 2021-09-30 MED ORDER — ONDANSETRON HCL 4 MG PO TABS: 4.0000 mg | ORAL_TABLET | Freq: Four times a day (QID) | ORAL | Status: DC | PRN

## 2021-09-30 MED ORDER — TRANEXAMIC ACID-NACL 1000-0.7 MG/100ML-% IV SOLN
INTRAVENOUS | Status: DC | PRN
  Administered 2021-09-30: 1000 mg via INTRAVENOUS

## 2021-09-30 MED ORDER — DEXAMETHASONE SODIUM PHOSPHATE 10 MG/ML IJ SOLN
INTRAMUSCULAR | Status: DC | PRN
  Administered 2021-09-30: 5 mg via INTRAVENOUS

## 2021-09-30 MED ORDER — ACETAMINOPHEN 500 MG PO TABS
1000.0000 mg | ORAL_TABLET | Freq: Once | ORAL | Status: AC
  Administered 2021-09-30: 1000 mg via ORAL
  Filled 2021-09-30: qty 2

## 2021-09-30 MED ORDER — ASPIRIN 325 MG PO TBEC
325.0000 mg | DELAYED_RELEASE_TABLET | Freq: Two times a day (BID) | ORAL | Status: DC
  Administered 2021-10-01 – 2021-10-05 (×9): 325 mg via ORAL
  Filled 2021-09-30 (×9): qty 1

## 2021-09-30 MED ORDER — BUPIVACAINE-EPINEPHRINE (PF) 0.5% -1:200000 IJ SOLN
INTRAMUSCULAR | Status: DC | PRN
  Administered 2021-09-30: 30 mL

## 2021-09-30 MED ORDER — ONDANSETRON HCL 4 MG/2ML IJ SOLN
INTRAMUSCULAR | Status: AC
  Filled 2021-09-30: qty 2

## 2021-09-30 MED ORDER — NEOMYCIN-POLYMYXIN B GU 40-200000 IR SOLN
Status: AC
Start: 2021-09-30 — End: ?
  Filled 2021-09-30: qty 20

## 2021-09-30 MED ORDER — SODIUM CHLORIDE 0.9 % IV SOLN
1.0000 g | INTRAVENOUS | Status: DC
  Administered 2021-09-30: 1 g via INTRAVENOUS
  Filled 2021-09-30: qty 10

## 2021-09-30 MED ORDER — SODIUM CHLORIDE 0.9 % IV SOLN: INTRAVENOUS | Status: DC

## 2021-09-30 MED ORDER — ACETAMINOPHEN 325 MG PO TABS: 325.0000 mg | ORAL_TABLET | Freq: Four times a day (QID) | ORAL | Status: DC | PRN

## 2021-09-30 MED ORDER — PHENOL 1.4 % MT LIQD
1.0000 | OROMUCOSAL | Status: DC | PRN
Start: 2021-09-30 — End: 2021-10-05

## 2021-09-30 MED ORDER — MORPHINE SULFATE (PF) 2 MG/ML IV SOLN: 0.5000 mg | INTRAVENOUS | Status: DC | PRN

## 2021-09-30 MED ORDER — ONDANSETRON HCL 4 MG/2ML IJ SOLN
INTRAMUSCULAR | Status: DC | PRN
  Administered 2021-09-30: 4 mg via INTRAVENOUS

## 2021-09-30 MED ORDER — LIDOCAINE HCL (CARDIAC) PF 100 MG/5ML IV SOSY
PREFILLED_SYRINGE | INTRAVENOUS | Status: DC | PRN
  Administered 2021-09-30: 60 mg via INTRAVENOUS

## 2021-09-30 MED ORDER — DEXAMETHASONE SODIUM PHOSPHATE 10 MG/ML IJ SOLN
INTRAMUSCULAR | Status: AC
  Filled 2021-09-30: qty 1

## 2021-09-30 MED ORDER — DOCUSATE SODIUM 100 MG PO CAPS
100.0000 mg | ORAL_CAPSULE | Freq: Two times a day (BID) | ORAL | Status: DC
  Administered 2021-09-30 – 2021-10-02 (×4): 100 mg via ORAL
  Filled 2021-09-30 (×4): qty 1

## 2021-09-30 MED ORDER — FENTANYL CITRATE (PF) 100 MCG/2ML IJ SOLN
INTRAMUSCULAR | Status: AC
  Filled 2021-09-30: qty 2

## 2021-09-30 MED ORDER — LATANOPROST 0.005 % OP SOLN
1.0000 [drp] | Freq: Every day | OPHTHALMIC | Status: DC
  Administered 2021-10-01 – 2021-10-04 (×4): 1 [drp] via OPHTHALMIC
  Filled 2021-09-30: qty 2.5

## 2021-09-30 MED ORDER — HYDROCODONE-ACETAMINOPHEN 7.5-325 MG PO TABS
1.0000 | ORAL_TABLET | ORAL | Status: DC | PRN
  Administered 2021-10-04 – 2021-10-05 (×2): 2 via ORAL
  Filled 2021-09-30 (×2): qty 2

## 2021-09-30 MED ORDER — MENTHOL 3 MG MT LOZG: 1.0000 | LOZENGE | OROMUCOSAL | Status: DC | PRN

## 2021-09-30 MED ORDER — PHENYLEPHRINE HCL-NACL 20-0.9 MG/250ML-% IV SOLN
INTRAVENOUS | Status: DC | PRN
  Administered 2021-09-30: 25 ug/min via INTRAVENOUS

## 2021-09-30 MED ORDER — OXYCODONE HCL 5 MG PO TABS: 5.0000 mg | ORAL_TABLET | Freq: Once | ORAL | Status: DC | PRN

## 2021-09-30 MED ORDER — PHENYLEPHRINE HCL-NACL 20-0.9 MG/250ML-% IV SOLN
INTRAVENOUS | Status: AC
  Filled 2021-09-30: qty 250

## 2021-09-30 MED ORDER — ACETAMINOPHEN 500 MG PO TABS
500.0000 mg | ORAL_TABLET | Freq: Four times a day (QID) | ORAL | Status: AC
  Administered 2021-09-30 – 2021-10-01 (×3): 500 mg via ORAL
  Filled 2021-09-30 (×3): qty 1

## 2021-09-30 MED ORDER — OXYCODONE HCL 5 MG/5ML PO SOLN: 5.0000 mg | Freq: Once | ORAL | Status: DC | PRN

## 2021-09-30 SURGICAL SUPPLY — 68 items
BLADE SAGITTAL WIDE XTHICK NO (BLADE) ×2 IMPLANT
BNDG COHESIVE 4X5 TAN ST LF (GAUZE/BANDAGES/DRESSINGS) ×2 IMPLANT
CEMENT BONE 40GM (Cement) ×4 IMPLANT
COVER BACK TABLE REUSABLE LG (DRAPES) ×2 IMPLANT
DRAPE 3/4 80X56 (DRAPES) ×4 IMPLANT
DRAPE IMP U-DRAPE 54X76 (DRAPES) ×2 IMPLANT
DRAPE INCISE IOBAN 66X45 STRL (DRAPES) ×2 IMPLANT
DRAPE INCISE IOBAN 66X60 STRL (DRAPES) ×2 IMPLANT
DRAPE ORTHO SPLIT 77X108 STRL (DRAPES) ×2
DRAPE SURG 17X11 SM STRL (DRAPES) ×2 IMPLANT
DRAPE SURG ORHT 6 SPLT 77X108 (DRAPES) ×2 IMPLANT
DRSG AQUACEL AG ADV 3.5X10 (GAUZE/BANDAGES/DRESSINGS) ×2 IMPLANT
DURAPREP 26ML APPLICATOR (WOUND CARE) ×4 IMPLANT
ELECT CAUTERY BLADE 6.4 (BLADE) ×2 IMPLANT
ELECT REM PT RETURN 9FT ADLT (ELECTROSURGICAL) ×2
ELECTRODE REM PT RTRN 9FT ADLT (ELECTROSURGICAL) ×1 IMPLANT
GAUZE 4X4 16PLY ~~LOC~~+RFID DBL (SPONGE) ×2 IMPLANT
GAUZE SPONGE 4X4 12PLY STRL (GAUZE/BANDAGES/DRESSINGS) ×2 IMPLANT
GAUZE XEROFORM 1X8 LF (GAUZE/BANDAGES/DRESSINGS) ×3 IMPLANT
GLOVE BIO SURGEON STRL SZ7.5 (GLOVE) ×4 IMPLANT
GLOVE SURG UNDER POLY LF SZ7.5 (GLOVE) ×2 IMPLANT
GOWN STRL REUS W/ TWL XL LVL3 (GOWN DISPOSABLE) ×1 IMPLANT
GOWN STRL REUS W/TWL XL LVL3 (GOWN DISPOSABLE) ×1
HEAD FEM UNIPOLAR 49 OD STRL (Hips) ×1 IMPLANT
HEMOVAC 400ML (MISCELLANEOUS)
HOLSTER ELECTROSUGICAL PENCIL (MISCELLANEOUS) ×2 IMPLANT
HOOD PEEL AWAY FLYTE STAYCOOL (MISCELLANEOUS) ×3 IMPLANT
IV NS IRRIG 3000ML ARTHROMATIC (IV SOLUTION) ×2 IMPLANT
KIT DRAIN HEMOVAC JP 7FR 400ML (MISCELLANEOUS) IMPLANT
KIT PREP HIP W/CEMENT RESTRICT (Miscellaneous) IMPLANT
KIT PREPARATION TOTAL HIP (Miscellaneous) ×1 IMPLANT
KIT TURNOVER KIT A (KITS) ×2 IMPLANT
MANIFOLD NEPTUNE II (INSTRUMENTS) ×2 IMPLANT
NDL FILTER BLUNT 18X1 1/2 (NEEDLE) ×1 IMPLANT
NDL MAYO CATGUT SZ4 TPR NDL (NEEDLE) ×1 IMPLANT
NDL SAFETY ECLIPSE 18X1.5 (NEEDLE) ×1 IMPLANT
NEEDLE FILTER BLUNT 18X 1/2SAF (NEEDLE) ×1
NEEDLE FILTER BLUNT 18X1 1/2 (NEEDLE) ×1 IMPLANT
NEEDLE HYPO 18GX1.5 SHARP (NEEDLE) ×1
NEEDLE MAYO CATGUT SZ4 (NEEDLE) ×2 IMPLANT
NS IRRIG 1000ML POUR BTL (IV SOLUTION) ×2 IMPLANT
PACK HIP PROSTHESIS (MISCELLANEOUS) ×2 IMPLANT
PILLOW ABDUCTION FOAM SM (MISCELLANEOUS) ×2 IMPLANT
PRESSURIZER FEM CANAL M (MISCELLANEOUS) IMPLANT
PULSAVAC PLUS IRRIG FAN TIP (DISPOSABLE) ×2
RETRIEVER SUT HEWSON (MISCELLANEOUS) ×2 IMPLANT
SPACER FEM TAPERED +0 12/14 (Hips) ×1 IMPLANT
SPONGE T-LAP 18X18 ~~LOC~~+RFID (SPONGE) ×8 IMPLANT
STAPLER SKIN PROX 35W (STAPLE) ×2 IMPLANT
STEM DIST FEM CENTRALIZR 11 (Hips) ×1 IMPLANT
STEM SUMMIT CEMENTED BASIC SZ3 (Hips) IMPLANT
SUMMIT CEMENT BASIC SZ3 (Hips) ×2 IMPLANT
SUT ETHIBOND 2 V 37 (SUTURE) ×2 IMPLANT
SUT QUILL PDO 0 36 36 VIOLET (SUTURE) ×2 IMPLANT
SUT TICRON 2-0 30IN 311381 (SUTURE) ×8 IMPLANT
SUT VIC AB 0 CT1 36 (SUTURE) ×2 IMPLANT
SUT VIC AB 2-0 CT1 27 (SUTURE) ×2
SUT VIC AB 2-0 CT1 TAPERPNT 27 (SUTURE) ×2 IMPLANT
SUT VIC AB 2-0 CT2 27 (SUTURE) ×4 IMPLANT
SYR 10ML LL (SYRINGE) ×2 IMPLANT
TAPE MICROFOAM 4IN (TAPE) IMPLANT
TAPE TRANSPORE STRL 2 31045 (GAUZE/BANDAGES/DRESSINGS) ×2 IMPLANT
TIP BRUSH PULSAVAC PLUS 24.33 (MISCELLANEOUS) ×2 IMPLANT
TIP FAN IRRIG PULSAVAC PLUS (DISPOSABLE) ×1 IMPLANT
TOWER CARTRIDGE SMART MIX (DISPOSABLE) ×1 IMPLANT
TRAY FOLEY SLVR 16FR LF STAT (SET/KITS/TRAYS/PACK) ×2 IMPLANT
TUBE SUCT KAM VAC (TUBING) IMPLANT
WATER STERILE IRR 500ML POUR (IV SOLUTION) ×2 IMPLANT

## 2021-09-30 NOTE — Transfer of Care (Signed)
Immediate Anesthesia Transfer of Care Note  Patient: Michael Stevens  Procedure(s) Performed: ARTHROPLASTY BIPOLAR HIP (HEMIARTHROPLASTY) (Right: Hip)  Patient Location: PACU  Anesthesia Type:General  Level of Consciousness: drowsy  Airway & Oxygen Therapy: Patient Spontanous Breathing and Patient connected to face mask oxygen  Post-op Assessment: Report given to RN and Post -op Vital signs reviewed and stable  Post vital signs: Reviewed and stable  Last Vitals:  Vitals Value Taken Time  BP 122/55 09/30/21 1515  Temp    Pulse 75 09/30/21 1517  Resp 19 09/30/21 1517  SpO2 100 % 09/30/21 1517  Vitals shown include unvalidated device data.  Last Pain:  Vitals:   09/30/21 1148  TempSrc: Temporal  PainSc:          Complications: No notable events documented.
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
## 2021-09-30 NOTE — Anesthesia Procedure Notes (Signed)
Procedure Name: Intubation Date/Time: 09/30/2021 1:05 PM  Performed by: Demetrius Charity, CRNAPre-anesthesia Checklist: Patient identified, Patient being monitored, Timeout performed, Emergency Drugs available and Suction available Patient Re-evaluated:Patient Re-evaluated prior to induction Oxygen Delivery Method: Circle system utilized Preoxygenation: Pre-oxygenation with 100% oxygen Induction Type: IV induction Ventilation: Mask ventilation without difficulty Laryngoscope Size: 4 Grade View: Grade II Tube type: Oral Tube size: 7.0 mm Number of attempts: 1 Airway Equipment and Method: Stylet and Video-laryngoscopy Placement Confirmation: ETT inserted through vocal cords under direct vision, positive ETCO2 and breath sounds checked- equal and bilateral Secured at: 21 cm Tube secured with: Tape Dental Injury: Teeth and Oropharynx as per pre-operative assessment
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
## 2021-09-30 NOTE — Anesthesia Preprocedure Evaluation (Addendum)
Anesthesia Evaluation  Patient identified by MRN, date of birth, ID band Patient confused    Reviewed: Allergy & Precautions, NPO status , Patient's Chart, lab work & pertinent test results  History of Anesthesia Complications Negative for: history of anesthetic complications  Airway Mallampati: III  TM Distance: >3 FB Neck ROM: full    Dental  (+) Chipped, Poor Dentition, Missing   Pulmonary neg shortness of breath, former smoker,    Pulmonary exam normal        Cardiovascular Exercise Tolerance: Good hypertension, Normal cardiovascular exam+ dysrhythmias      Neuro/Psych  Neuromuscular disease negative psych ROS   GI/Hepatic negative GI ROS, Neg liver ROS, PUD, GERD  Controlled,  Endo/Other  negative endocrine ROS  Renal/GU      Musculoskeletal   Abdominal   Peds  Hematology   Anesthesia Other Findings Past Medical History: No date: Allergic rhinitis No date: Anemia No date: Arthritis     Comment:  Back 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 03/26/2020: Chronic right-sided low back pain with right-sided  sciatica No date: Dementia ([place]) No date: Duodenal ulcer with hemorrhage No date: GERD (gastroesophageal reflux disease)     Comment:  ocassional No date: Glaucoma No date: Hypercholesteremia No date: Hypertension No date: Impotence 12/01/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     Comment:  Monitored by local ENT and [facility] No date: Mixed hyperlipidemia 09/03/2018: Oral cancer ([place]) No date: Parkinson's disease [facility])  Past Surgical History: April 2011: BACK SURGERY No date: COLONOSCOPY 06/22/2017: ESOPHAGOGASTRODUODENOSCOPY; N/A     Comment:  Procedure: ESOPHAGOGASTRODUODENOSCOPY (EGD);  Surgeon:               Toledo, Benay Pike, MD;  Location: [facility] ENDOSCOPY;                Service: Gastroenterology;  Laterality: N/A;  BMI    Body Mass Index: 23.55 kg/m      Reproductive/Obstetrics negative OB ROS                            Anesthesia Physical Anesthesia Plan  ASA: 3  Anesthesia Plan: General ETT   Post-op Pain Management:    Induction: Intravenous  PONV Risk Score and Plan: Ondansetron, Dexamethasone, Midazolam and Treatment may vary due to age or medical condition  Airway Management Planned: Oral ETT  Additional Equipment:   Intra-op Plan:   Post-operative Plan: Extubation in OR  Informed Consent: I have reviewed the patients History and Physical, chart, labs and discussed the procedure including the risks, benefits and alternatives for the proposed anesthesia with the patient or authorized representative who has indicated his/her understanding and acceptance.   Patient has DNR.  Discussed DNR with power of attorney and Suspend DNR.   Dental Advisory Given  Plan Discussed with: Anesthesiologist, CRNA and Surgeon  Anesthesia Plan Comments: (History and phone consent from the patients wife Kasey Ewings at [phone]   Wife consented for risks of anesthesia including but not limited to:  - adverse reactions to medications - damage to eyes, teeth, lips or other oral mucosa - nerve damage due to positioning  - damage to teeth, lips or other oral mucosa - sore throat or hoarseness - damage to heart, brain, nerves, lungs, other parts of body or loss of life  She voiced understanding.)      Anesthesia Quick Evaluation

## 2021-09-30 NOTE — ED Notes (Signed)
Writer informed by CT staff that pts daughter was asking questions as to why CT of hip was being performed.  Daughter was asking if this was for medicaid purposes.  CT staff informed pts daughter that the doctor ordered the CT and they just took the images.  Pts daughter proceeded to roll eyes at CT staff and stated "I know that's what you're supposed to say."

## 2021-09-30 NOTE — Plan of Care (Signed)

## 2021-09-30 NOTE — Hospital Course (Addendum)
Michael Stevens is a 81 y.o. male with history of advanced dementia, Parkinson's disease, anemia was brought to the ER after patient had a fall and was unable to get up.  CT scan showed a right hip fracture.  Patient has been seen by orthopedics, left hip hemiarthroplasty was performed 6/15.

## 2021-09-30 NOTE — ED Triage Notes (Signed)
Pt to ED from home for a fall that happened 6/14 afternoon out of the car. Pt unable to recall if he hit his head, no obvious hematoma present. Per wife, pt able to usually ambulate with walker, however pt unable to ambulate today and has pain in hip.   Pt alert and oriented to self.  Pt has hx of parkinsons and dementia

## 2021-09-30 NOTE — ED Notes (Signed)
Pt transported to pre op. Belongings include white tshirt.

## 2021-09-30 NOTE — Anesthesia Postprocedure Evaluation (Signed)
Anesthesia Post Note  Patient: Michael Stevens  Procedure(s) Performed: ARTHROPLASTY BIPOLAR HIP (HEMIARTHROPLASTY) (Right: Hip)  Patient location during evaluation: PACU Anesthesia Type: Spinal Level of consciousness: awake and alert Pain management: pain level controlled Vital Signs Assessment: post-procedure vital signs reviewed and stable Respiratory status: spontaneous breathing, nonlabored ventilation and respiratory function stable Cardiovascular status: blood pressure returned to baseline and stable Postop Assessment: no apparent nausea or vomiting Anesthetic complications: no   No notable events documented.   Last Vitals:  Vitals:   09/30/21 1600 09/30/21 1619  BP: 125/62 126/63  Pulse: 82 84  Resp: 10 18  Temp:  36.6 C  SpO2: 98% 99%    Last Pain:  Vitals:   09/30/21 1600  TempSrc:   PainSc: 0-No pain                 [place] Ouch

## 2021-09-30 NOTE — Progress Notes (Signed)
Initial Nutrition Assessment  DOCUMENTATION CODES:   Not applicable  INTERVENTION:   -Once diet is advanced, add:   -Ensure Enlive po TID, each supplement provides 350 kcal and 20 grams of protein -MVI with minerals daily  NUTRITION DIAGNOSIS:   Increased nutrient needs related to post-op healing as evidenced by estimated needs.  GOAL:   Patient will meet greater than or equal to 90% of their needs  MONITOR:   PO intake, Supplement acceptance, Diet advancement  REASON FOR ASSESSMENT:   Consult Assessment of nutrition requirement/status, Hip fracture protocol  ASSESSMENT:   Pt with history of advanced dementia, Parkinson's disease, anemia who was admitted with rt hip fracture s/p fall.  Pt admitted with rt hip fracture s/p mechanical fall.   Pt unavailable at time of visit. RD unable to obtain further nutrition-related history or complete nutrition-focused physical exam at this time.    Per orthopedics notes, plan for rt hip hemi arthoplasty today. Pt is currently NPO for procedure.   Reviewed wt hx; pt has experienced an 11.6% wt loss over the past 5 months, which is significant for time frame. Pt is at high risk for malnutrition given wt loss, advanced age, Parkinson's, and dementia, however, RD is unable tot identify at this time. Pt with increased nutritional needs for post-operative healing and would benefit from addition of oral nutrition supplements.   Medications reviewed and include sinemet and 0.9% sodium chloride infusion @ 75 ml/hr.   Labs reviewed: Na: 133, CBGS: 109.   Diet Order:   Diet Order             Diet NPO time specified Except for: Sips with Meds  Diet effective now                   EDUCATION NEEDS:   No education needs have been identified at this time  Skin:  Skin Assessment: Reviewed RN Assessment  Last BM:  Unknown  Height:   Ht Readings from Last 1 Encounters:  09/30/21 '5\' 5"'$  (1.651 m)    Weight:   Wt Readings from  Last 1 Encounters:  09/30/21 64.2 kg    Ideal Body Weight:  61.8 kg  BMI:  Body mass index is 23.55 kg/m.  Estimated Nutritional Needs:   Kcal:  2050-2250  Protein:  110-125 grams  Fluid:  > 2 L    Loistine Chance, RD, LDN, [place] Registered Dietitian II Certified Diabetes Care and Education Specialist Please refer to [facility] for RD and/or RD on-call/weekend/after hours pager

## 2021-09-30 NOTE — ED Notes (Signed)
Wet brief removed from pt at this time.  External catheter placed on pt at this time following peri care.
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
## 2021-09-30 NOTE — Op Note (Signed)
DATE OF SURGERY:  09/30/2021  MRN: [id number]   PRE-OPERATIVE DIAGNOSIS:  Right Femoral Neck Fracture   POST-OPERATIVE DIAGNOSIS: Right femoral Neck Fracture   PROCEDURE:  Procedure(s): ARTHROPLASTY BIPOLAR HIP (HEMIARTHROPLASTY)   PREOPERATIVE INDICATIONS:     Michael Stevens  is an 81 y.o. year old who was admitted with a diagnosis of right femoral Neck Fracture.  I have recommended surgical fixation with hemiarthroplasty for this injury. I have explained the surgery and the postoperative course to the patient and their family who have agreed with surgical management of this fracture.     The risks benefits and alternatives were discussed with the patient and their family including but not limited to the risks of  infection requiring removal of the prosthesis, bleeding requiring blood transfusion, nerve injury especially to the sciatic nerve leading to foot drop or lower extremity numbness, periprosthetic fracture, dislocation leg length discrepancy, change in lower extremity rotation persistent hip pain, loosening or failure of the components and the need for revision surgery. Medical risks include but are not limited to DVT and pulmonary embolism, myocardial infarction, stroke, pneumonia, respiratory failure and death.   OPERATIVE REPORT     SURGEON:  Renee Harder    ANESTHESIA: General  EBL:  614 cc    COMPLICATIONS:  None.    SPECIMEN: Femoral head to pathology    COMPONENTS: [place] size 3 cemented stem,52 mm head    PROCEDURE IN DETAIL:    The patient was met in the holding area and  identified.  The appropriate hip was identified and marked at the operative site after verbally confirming with the patient that this was the correct site of surgery.  The patient was then transported to the OR  and  underwent spinal anesthesia.  The patient was then placed in the lateral decubitus position with the operative side up and secured on the operating room table with a pegboard  and all bony prominences were adequately padded. This included an axillary roll and additional padding around the nonoperative leg to prevent compression to the common peroneal nerve.    The operative lower extremity was prepped and draped in a sterile fashion.  A time out was performed prior to incision to verify patient's name, date of birth, medical record number, correct site of surgery correct procedure to be performed. The timeout was also used to verify the patient received antibiotics now appropriate instruments, implants and radiographic studies were available in the room. Once all in attendance were in agreement case began.    A posterolateral approach was utilized via sharp dissection  carried down to the subcutaneous tissue.  Bleeding vessels were coagulated using electrocautery.  The fascia lata was identified and incised along the length of the skin incision.  The gluteus maximus muscle was then split in line with its fibers. Self-retaining retractors were  inserted.  With the hip internally rotated, the short external rotators  were identified and removed from the posterior attachment from the greater trochanter. The capsule was identified and a T-shaped capsulotomy was performed. The capsule was tagged with #2 Tycron for later repair.   The femoral neck fracture was exposed, and the femoral head was removed using a corkscrew device. This was measured to be 52 mm in diameter. The attention was then turned to proximal femur preparation.  An oscillating saw was used to perform a proximal femoral osteotomy 1 fingerbreadth above the lesser trochanter. The trial 52 mm femoral head was placed into the acetabulum and  had an excellent suction fit. The attention was then turned back to femoral preparation.    A femoral skid and Cobra retractor were placed under the femoral neck to allow for adequate visualization. A box osteotome was used to make the initial entry into the proximal femur. A single hand  reamer was used to prepare the femoral canal. A T-shaped femoral canal sounder was then used to ensure no penetration femoral cortex had occurred during reaming. The proximal femur was then sequentially broached by hand. A size 3 femoral trial broach was found to have best medial to lateral canal fit. Once adequate mediolateral canal fill was achieved the trial femoral broach, neck, and head was assembled and the hip was reduced. It was found to have excellent stability, equivalent leg lengths with functional range of motion. The trial components were then removed.   I copiously irrigated the femoral canal and then cemented the real femoral prosthesis into place into the appropriate version, slightly anteverted to the normal anatomy, and I impacted the real femoral head prosthesis.  The hip was then reduced and taken through functional range of motion and found to have excellent stability. Leg lengths were restored. The hip joint was copiously irrigated.    A soft tissue repair of the capsule was performed using #2 Tycron Excellent posterior capsular repair was achieved. The fascia lata was then closed with interrupted 0 Vicryl suture. The subcutaneous tissues were closed with 2-0 Vicryl and the skin approximated with staples.    The patient was then placed supine on the operative table. Leg lengths were checked clinically and found to be equivalent. An abduction pillow was placed between the lower extremities. The patient was then transferred to a hospital bed and brought to the PACU in stable condition. I was scrubbed and present the entire case and all sharp and instrument counts were correct at the conclusion of the case. I spoke with the patient's family to let them know the case was completed without complication patient was stable in recovery room.     Postoperative plan: 1.  Patient will be weightbearing as tolerated with posterior hip precautions. 2.  DVT prophylaxis can start on postoperative  day #1, recommend Aspirin 352m BID 3.  Keep dressing in place unless saturated 4.  Follow-up in clinic in approximately 2 weeks for staple removal and x-rays   MRenee Harder MD Orthopedic Surgeon
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
## 2021-09-30 NOTE — Progress Notes (Signed)
Admission profile updated. ?

## 2021-09-30 NOTE — Consult Note (Signed)
ORTHOPAEDIC CONSULTATION  REQUESTING PHYSICIAN: Sharen Hones, MD  Chief Complaint: right hip pain  HPI: Michael Stevens is a 81 y.o. male who complains of right hip pain after mechanical fall. The pain is sharp in character. Denies any numbness, tingling or constitutional symptoms.  Past medical history notable for advanced dementia, Parkinson's disease. Patient had a fall last night with inability to bear weight.  X-rays and CT scan showed presence of a displaced right femoral neck fracture.  Orthopedics was consulted regarding further management.  Patient walks with a walker.  No anticoagulation.   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   Monitored by local ENT and [facility]   Mixed hyperlipidemia    Oral cancer ([place]) 09/03/2018   Parkinson's disease [facility])    Past Surgical History:  Procedure Laterality Date   BACK SURGERY  April 2011   COLONOSCOPY     ESOPHAGOGASTRODUODENOSCOPY N/A 06/22/2017   Procedure: ESOPHAGOGASTRODUODENOSCOPY (EGD);  Surgeon: Toledo, Benay Pike, MD;  Location: [facility] ENDOSCOPY;  Service: Gastroenterology;  Laterality: N/A;   Social History   Socioeconomic History   Marital status: Married    Spouse name: [name]   Number of children: 2   Years of education: Not on file   Highest education level: High school graduate  Occupational History   Occupation: Retired  Tobacco Use   Smoking status: Former    Packs/day: 2.00    Years: 30.00    Total pack years: 60.00    Types: Cigarettes    Quit date: 12/01/1982    Years since quitting: 38.8   Smokeless tobacco: Never   Tobacco comments:     Quit 34 years ago  Vaping Use   Vaping Use: Never used  Substance and Sexual Activity   Alcohol use: Not Currently   Drug use: No   Sexual activity: Not Currently  Other Topics Concern   Not on file  Social History Narrative   Not on file   Social Determinants of Health   Financial Resource Strain: Low Risk  (05/04/2021)   Overall Financial Resource Strain (CARDIA)    Difficulty of Paying Living Expenses: Not hard at all  Food Insecurity: No Food Insecurity (05/04/2021)   Hunger Vital Sign    Worried About Running Out of Food in the Last Year: Never true    [place] in the Last Year: Never true  Transportation Needs: No Transportation Needs (05/04/2021)   PRAPARE - Hydrologist (Medical): No    Lack of Transportation (Non-Medical): No  Physical Activity: Inactive (05/04/2021)   Exercise Vital Sign    Days of Exercise per Week: 0 days    Minutes of Exercise per Session: 0 min  Stress: No Stress Concern Present (05/04/2021)   [place]    Feeling of Stress : Not at all  Social Connections: Moderately Isolated (05/04/2021)   Social Connection and Isolation Panel [NHANES]    Frequency of Communication with Friends and Family: Three times a  week    Frequency of Social Gatherings with Friends and Family: More than three times a week    Attends Religious Services: Never    Marine scientist or Organizations: No    Attends Music therapist: Never    Marital Status: Married   Family History  Problem Relation Age of Onset   Healthy Mother    Healthy Brother    No Known Allergies Prior to Admission medications   Medication Sig Start Date End Date Taking? Authorizing Provider  atorvastatin (LIPITOR) 20 MG tablet Take 1 tablet (20 mg total) by mouth daily. 03/17/21  Yes Bo Merino, FNP  carbidopa-levodopa (SINEMET IR) 25-250 MG tablet Take 1 tablet by mouth 4  (four) times daily. 04/01/20  Yes [provider]  donepezil (ARICEPT) 10 MG tablet Take 1 tablet by mouth at bedtime. 02/21/19  Yes [provider]  latanoprost (XALATAN) 0.005 % ophthalmic solution Place 1 drop into both eyes at bedtime. 06/13/17  Yes Lada, Satira Anis, MD  memantine (NAMENDA) 5 MG tablet Take 5 mg by mouth daily. 02/15/21  Yes [provider]  omeprazole (PRILOSEC) 20 MG capsule TAKE 1 CAPSULE BY MOUTH DAILY 05/18/21  Yes Bo Merino, FNP  traZODone (DESYREL) 50 MG tablet Take 50 mg by mouth at bedtime. 02/15/21  Yes [provider]  vitamin B-12 (CYANOCOBALAMIN) 1000 MCG tablet Take 1,000 mcg by mouth daily.   Yes [provider]   CT Hip Right Wo Contrast  Result Date: 09/30/2021 CLINICAL DATA:  Right hip fracture, for surgical planning. EXAM: CT OF THE RIGHT HIP WITHOUT CONTRAST TECHNIQUE: Multidetector CT imaging of the right hip was performed according to the standard protocol. Multiplanar CT image reconstructions were also generated. RADIATION DOSE REDUCTION: This exam was performed according to the departmental dose-optimization program which includes automated exposure control, adjustment of the mA and/or kV according to patient size and/or use of iterative reconstruction technique. COMPARISON:  AP pelvis today. FINDINGS: Bones/Joint/Cartilage Mild osteopenia. There is an acute transverse oblique subcapital to midcervical proximal to mid right femoral neck fracture. There is impaction at the fracture site and up to 1.3 cm cephalad displacement of the distal fragment, with mild anterior angulation of the distal fragment and up to 1.2 cm anterior displacement. The lesser trochanter is intact. Linear calcification which appears dystrophic and chronic at the lateral edge of the greater trochanter. Visualized portions of the right side of the sacrum and right hemipelvis are intact. The acetabular cup is intact. There is moderate joint space  loss of the superior right hip with small acetabular and femoral head osteophytes, spurring and narrowing of the symphysis pubis and ankylosis across the mid to anterior right SI joint. Ligaments Suboptimally assessed by CT. Muscles and Tendons No acute abnormality demonstrated. Soft tissues Subcutaneous stranding most likely due to contusion laterally at the level of the greater trochanter. No pelvic hematoma or mass is seen. There is moderate iliofemoral calcific plaque. There is a sizable impaction of feces in the rectum. There is mild bladder thickening versus underdistention. There is no incarcerated hernia. IMPRESSION: 1. Osteopenia with impacted and anteriorly and superiorly displaced proximal to mid right femoral neck fracture, with mild distal-fragment anterior angulation. No further evidence of fractures. 2. Degenerative changes. 3. Rectal fecal impaction. 4. Bladder thickening versus nondistention. Electronically Signed   By: Telford Nab M.D.   On: 09/30/2021 04:13   DG Pelvis 1-2 Views  Result Date: 09/30/2021 CLINICAL DATA:  Fall EXAM:  PELVIS - 1-2 VIEW COMPARISON:  None Available. FINDINGS: Mild symmetric degenerative changes in the hips. Lucency noted in the right femoral neck concerning for femoral neck fracture. There appears to be early callus formation suggesting this is a subacute fracture. No subluxation or dislocation. IMPRESSION: Concern for right femoral neck fracture, possibly subacute with early callus formation. This could be further evaluated with CT as clinically indicated. Electronically Signed   By: Rolm Baptise M.D.   On: 09/30/2021 02:57   DG Shoulder Right  Result Date: 09/30/2021 CLINICAL DATA:  Fall, right shoulder injury EXAM: RIGHT SHOULDER - 2+ VIEW COMPARISON:  None Available. FINDINGS: Normal alignment. No acute fracture or dislocation. Hill-Sachs deformity noted. Mild acromioclavicular and glenohumeral degenerative arthritis. Limited evaluation of the right  hemithorax is unremarkable. IMPRESSION: No acute fracture or dislocation. Hill-Sachs deformity. Electronically Signed   By: Fidela Salisbury M.D.   On: 09/30/2021 02:57   CT Cervical Spine Wo Contrast  Result Date: 09/30/2021 CLINICAL DATA:  Neck trauma (Age >= 65y).  Fall. EXAM: CT CERVICAL SPINE WITHOUT CONTRAST TECHNIQUE: Multidetector CT imaging of the cervical spine was performed without intravenous contrast. Multiplanar CT image reconstructions were also generated. RADIATION DOSE REDUCTION: This exam was performed according to the departmental dose-optimization program which includes automated exposure control, adjustment of the mA and/or kV according to patient size and/or use of iterative reconstruction technique. COMPARISON:  None Available. FINDINGS: Alignment: No subluxation. Skull base and vertebrae: No acute fracture. No primary bone lesion or focal pathologic process. Soft tissues and spinal canal: No prevertebral fluid or swelling. No visible canal hematoma. Disc levels:  Diffuse degenerative disc and facet disease. Upper chest: No acute bony abnormality. Other: None IMPRESSION: Advanced diffuse degenerative disc and facet disease. No acute bony abnormality. Electronically Signed   By: Rolm Baptise M.D.   On: 09/30/2021 02:37   CT HEAD WO CONTRAST (5MM)  Result Date: 09/30/2021 CLINICAL DATA:  Head trauma, minor (Age >= 65y) EXAM: CT HEAD WITHOUT CONTRAST TECHNIQUE: Contiguous axial images were obtained from the base of the skull through the vertex without intravenous contrast. RADIATION DOSE REDUCTION: This exam was performed according to the departmental dose-optimization program which includes automated exposure control, adjustment of the mA and/or kV according to patient size and/or use of iterative reconstruction technique. COMPARISON:  None Available. FINDINGS: Brain: Old right basal ganglia lacunar infarct. There is atrophy and chronic small vessel disease changes. No acute intracranial  abnormality. Specifically, no hemorrhage, hydrocephalus, mass lesion, acute infarction, or significant intracranial injury. Vascular: No hyperdense vessel or unexpected calcification. Skull: No acute calvarial abnormality. Sinuses/Orbits: No acute findings Other: None IMPRESSION: Old right basal ganglia lacunar infarct. Atrophy, chronic microvascular disease. No acute intracranial abnormality. Electronically Signed   By: Rolm Baptise M.D.   On: 09/30/2021 02:36    Positive ROS: All other systems have been reviewed and were otherwise negative with the exception of those mentioned in the HPI and as above.  Physical Exam: General: no acute distress Cardiovascular: No pedal edema Respiratory: No cyanosis, no use of accessory musculature GI: No organomegaly, abdomen is soft and non-tender Skin: No lesions in the area of chief complaint Neurologic: Sensation intact distally Psychiatric: Patient is competent for consent with normal mood and affect Lymphatic: No axillary or cervical lymphadenopathy  MUSCULOSKELETAL:  Diffuse tenderness to palpation about the right hip.  No obvious pain at the knee or ankle, range of motion deferred.  Right lower extremity is neurovascular intact  Assessment: 81 year old male with advanced  dementia, Parkinson's disease admitted with a displaced right femoral neck fracture  Plan: We had a long discussion regarding his fracture and operative nonoperative treatment options.  Recommendation was made for right hip hemiarthroplasty.  Please keep NPO.  Plan will be for surgery later today.    Renee Harder, MD    09/30/2021 7:54 AM

## 2021-10-01 ENCOUNTER — Encounter: Payer: Self-pay | Admitting: Orthopaedic Surgery

## 2021-10-01 DIAGNOSIS — G20 Parkinson's disease: Secondary | ICD-10-CM | POA: Diagnosis not present

## 2021-10-01 DIAGNOSIS — I10 Essential (primary) hypertension: Secondary | ICD-10-CM

## 2021-10-01 DIAGNOSIS — S72001A Fracture of unspecified part of neck of right femur, initial encounter for closed fracture: Secondary | ICD-10-CM | POA: Diagnosis not present

## 2021-10-01 LAB — CBC
HCT: 26.2 % — ABNORMAL LOW (ref 39.0–52.0)
Hemoglobin: 8.8 g/dL — ABNORMAL LOW (ref 13.0–17.0)
MCH: 30.3 pg (ref 26.0–34.0)
MCHC: 33.6 g/dL (ref 30.0–36.0)
MCV: 90.3 fL (ref 80.0–100.0)
Platelets: 188 10*3/uL (ref 150–400)
RBC: 2.9 MIL/uL — ABNORMAL LOW (ref 4.22–5.81)
RDW: 18.1 % — ABNORMAL HIGH (ref 11.5–15.5)
WBC: 19.5 10*3/uL — ABNORMAL HIGH (ref 4.0–10.5)
nRBC: 0 % (ref 0.0–0.2)

## 2021-10-01 LAB — BASIC METABOLIC PANEL
Anion gap: 6 (ref 5–15)
BUN: 17 mg/dL (ref 8–23)
CO2: 22 mmol/L (ref 22–32)
Calcium: 8.4 mg/dL — ABNORMAL LOW (ref 8.9–10.3)
Chloride: 109 mmol/L (ref 98–111)
Creatinine, Ser: 0.81 mg/dL (ref 0.61–1.24)
GFR, Estimated: >60 mL/min (ref >60)
Glucose, Bld: 128 mg/dL — ABNORMAL HIGH (ref 70–99)
Potassium: 4.1 mmol/L (ref 3.5–5.1)
Sodium: 137 mmol/L (ref 135–145)

## 2021-10-01 LAB — URINE CULTURE: Culture: NO GROWTH

## 2021-10-01 LAB — MAGNESIUM: Magnesium: 1.9 mg/dL (ref 1.7–2.4)

## 2021-10-01 MED ORDER — ENSURE ENLIVE PO LIQD
237.0000 mL | Freq: Two times a day (BID) | ORAL | Status: DC
  Administered 2021-10-01 – 2021-10-05 (×9): 237 mL via ORAL

## 2021-10-01 NOTE — NC FL2 (Signed)
[place] LEVEL OF CARE SCREENING TOOL     IDENTIFICATION  Patient Name: Michael Stevens Birthdate: [birth year] Sex: male Admission Date (Current Location): 09/30/2021  [facility] and [place] Number:  Engineering geologist and Address:  [facility], [address] [place], [place] [address]      Provider Number: [id number]  Attending Physician Name and Address:  Sharen Hones, MD  Relative Name and Phone Number:  Jessiah, Steinhart (Spouse)   [phone] (Mobile)    Current Level of Care: Hospital Recommended Level of Care: [place] Prior Approval Number:    Date Approved/Denied:   PASRR Number: pending, clinical sent  Discharge Plan: SNF    Current Diagnoses: Patient Active Problem List   Diagnosis Date Noted   Hip fracture ([place]) 09/30/2021   Closed displaced fracture of right femoral neck ([place])    Chronic cough 09/15/2021   Chronic right-sided low back pain with right-sided sciatica 03/26/2020   Lumbar stenosis with neurogenic claudication 03/26/2020   Parkinson's disease ([place]) 02/12/2020   Muscle stiffness 07/04/2019   B12 deficiency 02/27/2019   Memory loss, short term 02/27/2019   History of oral cancer 02/11/2019   Bilateral impacted cerumen 11/12/2018   Oral cancer ([place]) 09/03/2018   At risk for falling 04/05/2018   Screening for gout 04/05/2018   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   Tremor 11/27/2017   Leg pain, bilateral 11/07/2017   Neuropathy 11/07/2017   Duodenal ulcer with hemorrhage 09/05/2017   Abnormal EKG 01/31/2017   Right bundle branch block 01/31/2017   Chronic thoracic back pain 01/31/2017   BPH associated with nocturia 06/21/2016   Hyperglycemia 12/22/2015   DDD (degenerative disc disease), lumbar 07/14/2015   Internal hemorrhoids 02/11/2015   Male erectile dysfunction, unspecified 02/11/2015   Anemia 01/14/2015   Allergic rhinitis 10/13/2014   Bladder neck obstruction 10/13/2014    Diverticulosis of intestine 10/13/2014   Gastric catarrh 10/13/2014   Gastroesophageal reflux disease 10/13/2014   Glaucoma 10/13/2014   Hypercholesteremia 10/13/2014   Essential hypertension 10/13/2014   Occult blood in stools 02/06/2014   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   Neoplasm of uncertain behavior of lip, oral cavity, 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    Orientation RESPIRATION BLADDER Height & Weight     Self    External catheter, Incontinent Weight: 64.2 kg Height:  '5\' 5"'$  (165.1 cm)  BEHAVIORAL SYMPTOMS/MOOD NEUROLOGICAL BOWEL NUTRITION STATUS     (Impaired cognitive function)   Diet (Carb modified 1600-2000 cal)  AMBULATORY STATUS COMMUNICATION OF NEEDS Skin   Extensive Assist (+2 transfer) Verbally                         Personal Care Assistance Level of Assistance  Bathing, Feeding, Dressing Bathing Assistance: Limited assistance Feeding assistance: Limited assistance Dressing Assistance: Limited assistance (Max assist for lower body dressing)     Functional Limitations Info  Sight, Hearing, Speech Sight Info: Adequate Hearing Info: Adequate Speech Info: Adequate    SPECIAL CARE FACTORS FREQUENCY  PT (By licensed PT), OT (By licensed OT)     PT Frequency: Min 5x weekly OT Frequency: Min 5x weekly            Contractures Contractures Info: Not present    Additional Factors Info  Code Status, Allergies Code Status Info: DNR Allergies Info: No known allergies           Current Medications (10/01/2021):  This is the current hospital active  medication list Current Facility-Administered Medications  Medication Dose Route Frequency Provider Last Rate Last Admin   0.9 %  sodium chloride infusion   Intravenous Continuous Renee Harder, MD 75 mL/hr at 09/30/21 2102 New Bag at 09/30/21 2102   acetaminophen (TYLENOL) tablet 325-650 mg  325-650 mg Oral Q6H PRN Renee Harder, MD       acetaminophen (TYLENOL) tablet 500 mg  500 mg Oral Q6H Renee Harder, MD   500 mg at 10/01/21 1254   aspirin EC tablet 325 mg  325 mg Oral BID Renee Harder, MD   325 mg at 10/01/21 0960   carbidopa-levodopa (SINEMET IR) 25-250 MG per tablet immediate release 1 tablet  1 tablet Oral QID Renee Harder, MD   1 tablet at 10/01/21 1254   docusate sodium (COLACE) capsule 100 mg  100 mg Oral BID Renee Harder, MD   100 mg at 10/01/21 4540   feeding supplement (ENSURE ENLIVE / ENSURE PLUS) liquid 237 mL  237 mL Oral BID BM Sharen Hones, MD   237 mL at 10/01/21 1255   HYDROcodone-acetaminophen (NORCO) 7.5-325 MG per tablet 1-2 tablet  1-2 tablet Oral Q4H PRN Renee Harder, MD       HYDROcodone-acetaminophen (NORCO/VICODIN) 5-325 MG per tablet 1-2 tablet  1-2 tablet Oral Q4H PRN Renee Harder, MD   1 tablet at 09/30/21 2328   latanoprost (XALATAN) 0.005 % ophthalmic solution 1 drop  1 drop Both Eyes QHS Renee Harder, MD       menthol-cetylpyridinium (CEPACOL) lozenge 3 mg  1 lozenge Oral PRN Renee Harder, MD       Or   phenol (CHLORASEPTIC) mouth spray 1 spray  1 spray Mouth/Throat PRN Renee Harder, MD       metoCLOPramide (REGLAN) tablet 5-10 mg  5-10 mg Oral Q8H PRN Renee Harder, MD       Or   metoCLOPramide (REGLAN) injection 5-10 mg  5-10 mg Intravenous Q8H PRN Renee Harder, MD       morphine (PF) 2 MG/ML injection 0.5-1 mg  0.5-1 mg Intravenous Q2H PRN Renee Harder, MD       multivitamin with minerals tablet 1 tablet  1 tablet Oral Daily Renee Harder, MD   1 tablet at 10/01/21 9811   mupirocin ointment (BACTROBAN) 2 % 1 application   1 application  Nasal BID Renee Harder, MD   1 application  at 91/47/82 0958   ondansetron (ZOFRAN) tablet 4 mg  4 mg Oral Q6H PRN Renee Harder, MD       Or   ondansetron [facility]) injection 4 mg  4 mg Intravenous Q6H PRN Renee Harder, MD         Discharge Medications: Please see discharge summary for a list of discharge medications.  Relevant Imaging  Results:  Relevant Lab Results:   Additional Information    Pete Pelt, RN

## 2021-10-01 NOTE — Progress Notes (Signed)
Subjective: Michael Stevens is postop day 1 after right hip hemiarthroplasty with Dr. Sharlet Salina secondary to right femoral neck fracture.  Doing well no pain.  Objective: Vital signs in last 24 hours: Temp:  [97.1 F (36.2 C)-98.4 F (36.9 C)] 98.1 F (36.7 C) (06/16 0339) Pulse Rate:  [61-94] 94 (06/16 0339) Resp:  [10-18] 15 (06/16 0339) BP: (110-151)/(55-109) 133/80 (06/16 0339) SpO2:  [94 %-100 %] 100 % (06/16 0339)  Intake/Output from previous day: 06/15 0701 - 06/16 0700 In: 1899 [P.O.:240; I.V.:1009; IV Piggyback:650] Out: 970 [Urine:770; Blood:200] Intake/Output this shift: No intake/output data recorded.  Right lower extremity exam Aquacel has been pulled back some with trace bleeding.  Changed by nursing staff.  Neurovascular intact distally in the limb with well-perfused.  Wiggles all the digits appropriately.  No pain with manipulation today  Lab Results Recent Labs    09/30/21 0152 10/01/21 0451  WBC 15.4* 19.5*  HGB 10.9* 8.8*  HCT 32.7* 26.2*  NA 133* 137  K 3.9 4.1  CL 104 109  CO2 22 22  BUN 22 17  CREATININE 1.08 0.81   Liver Panel Recent Labs    09/30/21 0152  PROT 6.8  ALBUMIN 3.6  AST 19  ALT 17  ALKPHOS 67  BILITOT 1.2   Sedimentation Rate No results for input(s): "ESRSEDRATE" in the last 72 hours. C-Reactive Protein No results for input(s): "CRP" in the last 72 hours.  Microbiology: No results found for this or any previous visit (from the past 240 hour(s)).  Studies/Results: Pelvis Portable  Result Date: 09/30/2021 CLINICAL DATA:  Postop. EXAM: PORTABLE PELVIS 1-2 VIEWS COMPARISON:  Preoperative imaging. FINDINGS: Right hip arthroplasty in expected alignment. No periprosthetic lucency or fracture. Recent postsurgical change includes air and edema in the soft tissues. Lateral skin staples in place. IMPRESSION: Right hip arthroplasty without immediate postoperative complication. Electronically Signed   By: Keith Rake M.D.   On:  09/30/2021 16:00   CT Hip Right Wo Contrast  Result Date: 09/30/2021 CLINICAL DATA:  Right hip fracture, for surgical planning. EXAM: CT OF THE RIGHT HIP WITHOUT CONTRAST TECHNIQUE: Multidetector CT imaging of the right hip was performed according to the standard protocol. Multiplanar CT image reconstructions were also generated. RADIATION DOSE REDUCTION: This exam was performed according to the departmental dose-optimization program which includes automated exposure control, adjustment of the mA and/or kV according to patient size and/or use of iterative reconstruction technique. COMPARISON:  AP pelvis today. FINDINGS: Bones/Joint/Cartilage Mild osteopenia. There is an acute transverse oblique subcapital to midcervical proximal to mid right femoral neck fracture. There is impaction at the fracture site and up to 1.3 cm cephalad displacement of the distal fragment, with mild anterior angulation of the distal fragment and up to 1.2 cm anterior displacement. The lesser trochanter is intact. Linear calcification which appears dystrophic and chronic at the lateral edge of the greater trochanter. Visualized portions of the right side of the sacrum and right hemipelvis are intact. The acetabular cup is intact. There is moderate joint space loss of the superior right hip with small acetabular and femoral head osteophytes, spurring and narrowing of the symphysis pubis and ankylosis across the mid to anterior right SI joint. Ligaments Suboptimally assessed by CT. Muscles and Tendons No acute abnormality demonstrated. Soft tissues Subcutaneous stranding most likely due to contusion laterally at the level of the greater trochanter. No pelvic hematoma or mass is seen. There is moderate iliofemoral calcific plaque. There is a sizable impaction of feces in the  rectum. There is mild bladder thickening versus underdistention. There is no incarcerated hernia. IMPRESSION: 1. Osteopenia with impacted and anteriorly and superiorly  displaced proximal to mid right femoral neck fracture, with mild distal-fragment anterior angulation. No further evidence of fractures. 2. Degenerative changes. 3. Rectal fecal impaction. 4. Bladder thickening versus nondistention. Electronically Signed   By: Telford Nab M.D.   On: 09/30/2021 04:13   DG Pelvis 1-2 Views  Result Date: 09/30/2021 CLINICAL DATA:  Fall EXAM: PELVIS - 1-2 VIEW COMPARISON:  None Available. FINDINGS: Mild symmetric degenerative changes in the hips. Lucency noted in the right femoral neck concerning for femoral neck fracture. There appears to be early callus formation suggesting this is a subacute fracture. No subluxation or dislocation. IMPRESSION: Concern for right femoral neck fracture, possibly subacute with early callus formation. This could be further evaluated with CT as clinically indicated. Electronically Signed   By: Rolm Baptise M.D.   On: 09/30/2021 02:57   DG Shoulder Right  Result Date: 09/30/2021 CLINICAL DATA:  Fall, right shoulder injury EXAM: RIGHT SHOULDER - 2+ VIEW COMPARISON:  None Available. FINDINGS: Normal alignment. No acute fracture or dislocation. Hill-Sachs deformity noted. Mild acromioclavicular and glenohumeral degenerative arthritis. Limited evaluation of the right hemithorax is unremarkable. IMPRESSION: No acute fracture or dislocation. Hill-Sachs deformity. Electronically Signed   By: Fidela Salisbury M.D.   On: 09/30/2021 02:57   CT Cervical Spine Wo Contrast  Result Date: 09/30/2021 CLINICAL DATA:  Neck trauma (Age >= 65y).  Fall. EXAM: CT CERVICAL SPINE WITHOUT CONTRAST TECHNIQUE: Multidetector CT imaging of the cervical spine was performed without intravenous contrast. Multiplanar CT image reconstructions were also generated. RADIATION DOSE REDUCTION: This exam was performed according to the departmental dose-optimization program which includes automated exposure control, adjustment of the mA and/or kV according to patient size and/or use of  iterative reconstruction technique. COMPARISON:  None Available. FINDINGS: Alignment: No subluxation. Skull base and vertebrae: No acute fracture. No primary bone lesion or focal pathologic process. Soft tissues and spinal canal: No prevertebral fluid or swelling. No visible canal hematoma. Disc levels:  Diffuse degenerative disc and facet disease. Upper chest: No acute bony abnormality. Other: None IMPRESSION: Advanced diffuse degenerative disc and facet disease. No acute bony abnormality. Electronically Signed   By: Rolm Baptise M.D.   On: 09/30/2021 02:37   CT HEAD WO CONTRAST (5MM)  Result Date: 09/30/2021 CLINICAL DATA:  Head trauma, minor (Age >= 65y) EXAM: CT HEAD WITHOUT CONTRAST TECHNIQUE: Contiguous axial images were obtained from the base of the skull through the vertex without intravenous contrast. RADIATION DOSE REDUCTION: This exam was performed according to the departmental dose-optimization program which includes automated exposure control, adjustment of the mA and/or kV according to patient size and/or use of iterative reconstruction technique. COMPARISON:  None Available. FINDINGS: Brain: Old right basal ganglia lacunar infarct. There is atrophy and chronic small vessel disease changes. No acute intracranial abnormality. Specifically, no hemorrhage, hydrocephalus, mass lesion, acute infarction, or significant intracranial injury. Vascular: No hyperdense vessel or unexpected calcification. Skull: No acute calvarial abnormality. Sinuses/Orbits: No acute findings Other: None IMPRESSION: Old right basal ganglia lacunar infarct. Atrophy, chronic microvascular disease. No acute intracranial abnormality. Electronically Signed   By: Rolm Baptise M.D.   On: 09/30/2021 02:36    Medications: Reviewed  Assessment/Plan: Michael Stevens is postop day 1 right hip hemiarthroplasty with Dr. Sharlet Salina.  Doing well.  Aquacel bandage changed today, discussed with nursing staff.  He will work with physical therapy.  Postoperative plan as below Postoperative plan: 1.  Patient will be weightbearing as tolerated with posterior hip precautions. 2.  DVT prophylaxis can start on postoperative day #1, recommend Aspirin '325mg'$  BID 3.  Keep dressing in place unless saturated 4.  Follow-up in clinic in approximately 2 weeks for staple removal and x-rays  LOS: 1 day    Roland Rack [facility] 10/01/2021, 7:53 AM

## 2021-10-01 NOTE — Progress Notes (Addendum)
Nutrition Follow-up  DOCUMENTATION CODES:   Not applicable  INTERVENTION:   -Decrease Ensure Enlive po to BID, each supplement provides 350 kcal and 20 grams of protein.  -Continue MVI with minerals daily  NUTRITION DIAGNOSIS:   Increased nutrient needs related to post-op healing as evidenced by estimated needs.  Ongoing  GOAL:   Patient will meet greater than or equal to 90% of their needs  Progressing   MONITOR:   PO intake, Supplement acceptance, Diet advancement  REASON FOR ASSESSMENT:   Consult Assessment of nutrition requirement/status, Hip fracture protocol  ASSESSMENT:   Pt with history of advanced dementia, Parkinson's disease, anemia who was admitted with rt hip fracture s/p fall.  6/15- s/p Procedure(s): ARTHROPLASTY BIPOLAR HIP (HEMIARTHROPLASTY)  Reviewed I/O's: +929 ml x 24 hours  UOP: 770 ml x 24 hours   Pt sitting in recliner chair at time of visit. He reports his appetite has improved since admission. Observed breakfast tray- pt consumed all but a half of an English muffin. He also consumed about 75% of an Ensure Max. Documented meal completions 100%.   Per pt, he has a variable appetite at home at baseline. He is unsure how often he eats, but reports his wife will occasionally cook or he will eat out at either [employer] or [employer]. He reports he eats all of his meal when he does eat out.    Pt unsure if he has lost weight. Reviewed wt hx; pt has experienced an 11.6% wt loss over the past 5 months, which is significant for time frame.  Discussed importance of good meal and supplement intake to promote healing. Pt amenable to continue supplements.   Medications reviewed and include sinemet and colace.   Labs reviewed: CBGS: 109.   NUTRITION - FOCUSED PHYSICAL EXAM:  Flowsheet Row Most Recent Value  Orbital Region No depletion  Upper Arm Region No depletion  Thoracic and Lumbar Region No depletion  Buccal Region No depletion   Temple Region No depletion  Clavicle Bone Region No depletion  Clavicle and Acromion Bone Region No depletion  Scapular Bone Region No depletion  Dorsal Hand No depletion  Patellar Region No depletion  Anterior Thigh Region No depletion  Posterior Calf Region No depletion  Edema (RD Assessment) None  Hair Reviewed  Eyes Reviewed  Mouth Reviewed  Skin Reviewed  Nails Reviewed       Diet Order:   Diet Order             Diet Carb Modified Fluid consistency: Thin; Room service appropriate? Yes  Diet effective now                   EDUCATION NEEDS:   No education needs have been identified at this time  Skin:  Skin Assessment: Skin Integrity Issues: Skin Integrity Issues:: Incisions Incisions: closed rt hip  Last BM:  Unknown  Height:   Ht Readings from Last 1 Encounters:  09/30/21 '5\' 5"'$  (1.651 m)    Weight:   Wt Readings from Last 1 Encounters:  09/30/21 64.2 kg    Ideal Body Weight:  61.8 kg  BMI:  Body mass index is 23.55 kg/m.  Estimated Nutritional Needs:   Kcal:  2050-2250  Protein:  110-125 grams  Fluid:  > 2 L    Loistine Chance, RD, LDN, [place] Registered Dietitian II Certified Diabetes Care and Education Specialist Please refer to [facility] for RD and/or RD on-call/weekend/after hours pager

## 2021-10-01 NOTE — Evaluation (Addendum)
Physical Therapy Evaluation Patient Details Name: Michael Stevens MRN: [id number] DOB: 08-25-1940 Today's Date: 10/01/2021  History of Present Illness  Pt is 81 yo M s/p R hemiarthoplasty 09/30/21. PMH includes dementia, parkinson's disease, anemia, GERD, HTN  Clinical Impression  Pt alert and orientated to name but able to participate with PT following simple one step commands w/ repetition and time to complete.  Pt was able to complete bed mobility w/ +2 modA and required trunk control and leg management. Pt was able to perform sit to stand w/ +2 modA. and required steadying to prevent posterior LOB and cuing to stand tall. Pt completed walk of 87f w/ +2 modA using RW and with assist for steadying to prevent posterior LOB and cuing for sequencing with close chair follow. Pt will benefit from PT services in a SNF setting upon discharge to safely address deficits listed in patient problem list for decreased caregiver assistance and eventual return to PLOF.      Recommendations for follow up therapy are one component of a multi-disciplinary discharge planning process, led by the attending physician.  Recommendations may be updated based on patient status, additional functional criteria and insurance authorization.  Follow Up Recommendations Skilled nursing-short term rehab (<3 hours/day)    Assistance Recommended at Discharge Frequent or constant Supervision/Assistance  Patient can return home with the following  Help with stairs or ramp for entrance;Two people to help with walking and/or transfers;Two people to help with bathing/dressing/bathroom;Assistance with feeding;Assist for transportation;Direct supervision/assist for financial management;Assistance with cooking/housework    Equipment Recommendations Other (comment) (TBD)  Recommendations for Other Services       Functional Status Assessment Patient has had a recent decline in their functional status and demonstrates the ability to make  significant improvements in function in a reasonable and predictable amount of time.     Precautions / Restrictions Precautions Precautions: Posterior Hip;Fall Precaution Comments: Adductor pillow while seated in recliner or bed Restrictions Weight Bearing Restrictions: Yes RLE Weight Bearing: Weight bearing as tolerated Other Position/Activity Restrictions: adduction pillow at rest as pt unable to recall hip precautions      Mobility  Bed Mobility Overal bed mobility: Needs Assistance Bed Mobility: Supine to Sit     Supine to sit: Mod assist, +2 for physical assistance, HOB elevated     General bed mobility comments: tactile/verbal cuing to initiate and assistance for trunk control and leg management    Transfers Overall transfer level: Needs assistance Equipment used: Rolling walker (2 wheels) Transfers: Sit to/from Stand Sit to Stand: Mod assist, +2 physical assistance   Step pivot transfers: Mod assist, +2 physical assistance       General transfer comment: tactile/verbal cuing for sequencing    Ambulation/Gait Ambulation/Gait assistance: Mod assist, +2 physical assistance Gait Distance (Feet): 3 Feet Assistive device: Rolling walker (2 wheels) Gait Pattern/deviations: Step-to pattern, Decreased step length - right, Decreased step length - left, Decreased stance time - right Gait velocity: decreased     General Gait Details: required modA for steadying and tactile/verbal cuing for sequencing  Stairs            Wheelchair Mobility    Modified Rankin (Stroke Patients Only)       Balance Overall balance assessment: Needs assistance Sitting-balance support: Bilateral upper extremity supported, Feet supported Sitting balance-Leahy Scale: Fair     Standing balance support: Bilateral upper extremity supported, During functional activity, Reliant on assistive device for balance Standing balance-Leahy Scale: Poor  Pertinent Vitals/Pain Pain Assessment Pain Assessment: No/denies pain    Home Living Family/patient expects to be discharged to:: Skilled nursing facility Living Arrangements: Spouse/significant other                 Additional Comments: Pt is poor historian; per chart pt lives at home w/ wife    Prior Function Prior Level of Function : Patient poor historian/Family not available             Mobility Comments: per chart pt ambulated using RW ADLs Comments: unable to determine     Hand Dominance        Extremity/Trunk Assessment   Upper Extremity Assessment Upper Extremity Assessment: Generalized weakness    Lower Extremity Assessment Lower Extremity Assessment: Generalized weakness       Communication   Communication:  (tangential speech)  Cognition Arousal/Alertness: Awake/alert Behavior During Therapy: Restless Overall Cognitive Status: History of cognitive impairments - at baseline                                          General Comments      Exercises     Assessment/Plan    PT Assessment Patient needs continued PT services  PT Problem List Decreased strength;Decreased mobility;Decreased activity tolerance;Decreased balance;Decreased cognition;Decreased knowledge of use of DME;Decreased knowledge of precautions       PT Treatment Interventions DME instruction;Therapeutic exercise;Balance training;Gait training;Stair training;Functional mobility training;Therapeutic activities;Patient/family education    PT Goals (Current goals can be found in the Care Plan section)  Acute Rehab PT Goals Patient Stated Goal: none stated PT Goal Formulation: Patient unable to participate in goal setting Time For Goal Achievement: 10/15/21 Potential to Achieve Goals: Poor    Frequency 7X/week     Co-evaluation PT/OT/SLP Co-Evaluation/Treatment: Yes Reason for Co-Treatment: Necessary to address cognition/behavior during functional  activity;Complexity of the patient's impairments (multi-system involvement);For patient/therapist safety PT goals addressed during session: Mobility/safety with mobility OT goals addressed during session: ADL's and self-care       AM-PAC PT "6 Clicks" Mobility  Outcome Measure Help needed turning from your back to your side while in a flat bed without using bedrails?: A Lot Help needed moving from lying on your back to sitting on the side of a flat bed without using bedrails?: A Lot Help needed moving to and from a bed to a chair (including a wheelchair)?: A Lot Help needed standing up from a chair using your arms (e.g., wheelchair or bedside chair)?: A Lot Help needed to walk in hospital room?: A Lot Help needed climbing 3-5 steps with a railing? : Total 6 Click Score: 11    End of Session Equipment Utilized During Treatment: Gait belt Activity Tolerance: Patient tolerated treatment well Patient left: in chair;with call bell/phone within reach;Other (comment) (lap belt) Nurse Communication: Mobility status PT Visit Diagnosis: Other abnormalities of gait and mobility (R26.89);Unsteadiness on feet (R26.81);Muscle weakness (generalized) (M62.81)    Time: 1030-1055 PT Time Calculation (min) (ACUTE ONLY): 25 min   Charges:   PT Evaluation $PT Eval Low Complexity: 1 Low PT Treatments $Therapeutic Activity: 8-22 mins        Turner Daniels, SPT  10/01/2021, 12:49 PM

## 2021-10-01 NOTE — TOC Initial Note (Addendum)
Transition of Care [facility]) - Initial/Assessment Note    Patient Details  Name: Michael Stevens MRN: [id number] Date of Birth: 11-01-1940  Transition of Care [facility]) CM/SW Contact:    Pete Pelt, RN Phone Number: 10/01/2021, 3:09 PM  Clinical Narrative:      Patient only oriented to self, RNCM contacted spouse via phone to perform assessment.  Notified spouse that SNF was recommended.  Spouse agrees to bed search.  Bed search started.             Addendum 1553:  clinical sent to pasrr today.  Expected Discharge Plan: Skilled Nursing Facility Barriers to Discharge: Continued Medical Work up   Patient Goals and CMS Choice     Choice offered to / list presented to : Spouse  Expected Discharge Plan and Services Expected Discharge Plan: [place] arrangements for the past 2 months: Single Family Home                                      Prior Living Arrangements/Services Living arrangements for the past 2 months: Single Family Home Lives with:: Self, Spouse Patient language and need for interpreter reviewed:: Yes (Patient only oriented to self, RNCM contacted wife via phone) Do you feel safe going back to the place where you live?: Yes      Need for Family Participation in Patient Care: Yes (Comment) Care giver support system in place?: Yes (comment)   Criminal Activity/Legal Involvement Pertinent to Current Situation/Hospitalization: No - Comment as needed  Activities of Daily Living Home Assistive Devices/Equipment: Walker (specify type) ADL Screening (condition at time of admission) Patient's cognitive ability adequate to safely complete daily activities?: Yes Is the patient deaf or have difficulty hearing?: No Does the patient have difficulty seeing, even when wearing glasses/contacts?: No Does the patient have difficulty concentrating, remembering, or making decisions?: Yes Patient able to express need for assistance with ADLs?:  Yes Does the patient have difficulty dressing or bathing?: Yes Independently performs ADLs?: No Communication: Independent Dressing (OT): Needs assistance Is this a change from baseline?: Change from baseline, expected to last >3 days Grooming: Independent Feeding: Independent Bathing: Needs assistance Is this a change from baseline?: Change from baseline, expected to last >3 days Toileting: Needs assistance Is this a change from baseline?: Change from baseline, expected to last <3 days In/Out Bed: Needs assistance Is this a change from baseline?: Change from baseline, expected to last >3 days Walks in Home: Needs assistance Is this a change from baseline?: Change from baseline, expected to last >3 days Does the patient have difficulty walking or climbing stairs?: Yes Weakness of Legs: Both Weakness of Arms/Hands: None  Permission Sought/Granted Permission sought to share information with : Case Manager Permission granted to share information with : Yes, Verbal Permission Granted     Permission granted to share info w AGENCY: [facility] SNF        Emotional Assessment Appearance:: Appears younger than stated age     Orientation: : Oriented to Self Alcohol / Substance Use: Not Applicable Psych Involvement: No (comment)  Admission diagnosis:  Hip fracture ([place]) [S72.009A] Acute UTI [N39.0] Fall, initial encounter [W19.XXXA] Closed displaced fracture of right femoral neck ([place]) [S72.001A] Patient Active Problem List   Diagnosis Date Noted   Hip fracture ([place]) 09/30/2021   Closed displaced fracture of right femoral neck ([place])  Chronic cough 09/15/2021   Chronic right-sided low back pain with right-sided sciatica 03/26/2020   Lumbar stenosis with neurogenic claudication 03/26/2020   Parkinson's disease ([place]) 02/12/2020   Muscle stiffness 07/04/2019   B12 deficiency 02/27/2019   Memory loss, short term 02/27/2019   History of oral cancer 02/11/2019   Bilateral impacted  cerumen 11/12/2018   Oral cancer ([place]) 09/03/2018   At risk for falling 04/05/2018   Screening for gout 04/05/2018   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   Tremor 11/27/2017   Leg pain, bilateral 11/07/2017   Neuropathy 11/07/2017   Duodenal ulcer with hemorrhage 09/05/2017   Abnormal EKG 01/31/2017   Right bundle branch block 01/31/2017   Chronic thoracic back pain 01/31/2017   BPH associated with nocturia 06/21/2016   Hyperglycemia 12/22/2015   DDD (degenerative disc disease), lumbar 07/14/2015   Internal hemorrhoids 02/11/2015   Male erectile dysfunction, unspecified 02/11/2015   Anemia 01/14/2015   Allergic rhinitis 10/13/2014   Bladder neck obstruction 10/13/2014   Diverticulosis of intestine 10/13/2014   Gastric catarrh 10/13/2014   Gastroesophageal reflux disease 10/13/2014   Glaucoma 10/13/2014   Hypercholesteremia 10/13/2014   Essential hypertension 10/13/2014   Occult blood in stools 02/06/2014   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   Neoplasm of uncertain behavior of lip, oral cavity, 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, [place] [address] Phone: [phone] Fax: [phone]  [facility], [place] [address] Phone: [phone] Fax: [phone]  [facility] ([place], [place] [address] [facility] [address] [place] [address] Phone: [phone] Fax: [phone]  [facility] ([facility] ) - [place], [place] [address] Phone: [phone] Fax: [phone]  [id number] [place] [address] [place]Phone: [phone]: [phone]    Social Determinants of Health (SDOH) Interventions     Readmission Risk Interventions     No data to display
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
## 2021-10-01 NOTE — Evaluation (Signed)
Occupational Therapy Evaluation Patient Details Name: Michael Stevens MRN: [id number] DOB: May 05, 1940 Today's Date: 10/01/2021   History of Present Illness Pt is 81 yo M s/p R hemiarthoplasty 09/30/21. PMH includes dementia, parkinson's disease, anemia, GERD, HTN   Clinical Impression   Pt was seen for OT/PT co-evaluation this date. Pt is poor historian, per chart pt lives at home w/ spouse and uses a RW for mobility at baseline. No family was present to provide home set up or PLOF information. Pt denies pain at start of session, but grimaced and reported pain during movement. Pt presents to acute OT demonstrating impaired ADL performance and functional mobility 2/2 decreased activity tolerance and R hip hemiarthroplasty functional strength/ROM/balance deficits. Pt currently requires MOD A x2 for supine>sit - needs assistance with bringing BLE to EOB and multimodal cueing for initiating trunk and MOD A x2 + RW with cueing for safe hand placement and sequencing for STS.   MIN A with multimodal cues for initiation to don/doff hosptial gown - needs assistance with pulling gown into place and line management. Anticipate MAX A for lower body dressing and MIN A with multimodal cueing to perform grooming tasks while seated. Pt left in recliner with mit on RUE to prevent pulling out IV on LUE and lap belt in place, with all needs met. Pt would benefit from skilled OT to address noted impairments and functional limitations (see below for any additional details). Upon hospital discharge, recommend STR to maximize pt safety and return to PLOF.      Recommendations for follow up therapy are one component of a multi-disciplinary discharge planning process, led by the attending physician.  Recommendations may be updated based on patient status, additional functional criteria and insurance authorization.   Follow Up Recommendations  Skilled nursing-short term rehab (<3 hours/day)    Assistance Recommended at  Discharge Frequent or constant Supervision/Assistance  Patient can return home with the following Two people to help with walking and/or transfers;A lot of help with bathing/dressing/bathroom;Assistance with cooking/housework;Assist for transportation    Functional Status Assessment  Patient has had a recent decline in their functional status and demonstrates the ability to make significant improvements in function in a reasonable and predictable amount of time.  Equipment Recommendations  BSC/3in1    Recommendations for Other Services       Precautions / Restrictions Precautions Precautions: Posterior Hip;Fall Precaution Comments: Adductor pillow while seated in recliner or bed Restrictions Weight Bearing Restrictions: Yes RLE Weight Bearing: Weight bearing as tolerated Other Position/Activity Restrictions: adduction pillow at rest as pt unable to recall hip precautions      Mobility Bed Mobility Overal bed mobility: Needs Assistance Bed Mobility: Supine to Sit     Supine to sit: Mod assist, +2 for physical assistance, HOB elevated     General bed mobility comments: Pt required assistance with bringing BLE to EOB and multimodal cueing for initiating trunk    Transfers Overall transfer level: Needs assistance Equipment used: Rolling walker (2 wheels) Transfers: Sit to/from Stand, Bed to chair/wheelchair/BSC Sit to Stand: Mod assist, +2 physical assistance     Step pivot transfers: Mod assist, +2 physical assistance (With mutlimodal cueing for initiation and sequencing)            Balance Overall balance assessment: Needs assistance Sitting-balance support: Bilateral upper extremity supported, Feet supported Sitting balance-Leahy Scale: Fair     Standing balance support: Bilateral upper extremity supported, During functional activity, Reliant on assistive device for balance Standing balance-Leahy Scale: Poor  ADL either  performed or assessed with clinical judgement   ADL Overall ADL's : Needs assistance/impaired                                       General ADL Comments: MIN A with multimodal cues for initiation to don/doff hosptial gown - needs assistance with pulling gown into place and line management. Anticipate MAX A for lower body dressing and MIN A with multimodal cueing to perform grooming tasks while seated.      Pertinent Vitals/Pain Pain Assessment Pain Assessment: No/denies pain        Extremity/Trunk Assessment Upper Extremity Assessment Upper Extremity Assessment: Generalized weakness   Lower Extremity Assessment Lower Extremity Assessment: Generalized weakness       Communication Communication Communication:  (tangential speech)   Cognition Arousal/Alertness: Awake/alert Behavior During Therapy: Restless Overall Cognitive Status: History of cognitive impairments - at baseline                                 General Comments: Pt oriented to person                Home Living Family/patient expects to be discharged to:: Skilled nursing facility Living Arrangements: Spouse/significant other                               Additional Comments: Pt is poor historian; per chart pt lives at home w/ wife      Prior Functioning/Environment Prior Level of Function : Patient poor historian/Family not available             Mobility Comments: per chart pt ambulated using RW ADLs Comments: unable to determine        OT Problem List: Decreased strength;Decreased range of motion;Decreased activity tolerance;Impaired balance (sitting and/or standing);Decreased cognition;Decreased safety awareness      OT Treatment/Interventions: Self-care/ADL training;Therapeutic exercise;DME and/or AE instruction;Therapeutic activities;Patient/family education;Balance training    OT Goals(Current goals can be found in the care plan section)  Acute Rehab OT Goals Patient Stated Goal: To improve pain OT Goal Formulation: With patient Time For Goal Achievement: 10/15/21 Potential to Achieve Goals: Good ADL Goals Pt Will Perform Grooming: standing;with min assist (with LRAD) Pt Will Perform Lower Body Dressing: with caregiver independent in assisting;with min assist;sit to/from stand (with LRAD) Pt Will Transfer to Toilet: with min assist;ambulating;bedside commode;with +2 assist (with raised height)  OT Frequency: Min 2X/week    Co-evaluation PT/OT/SLP Co-Evaluation/Treatment: Yes Reason for Co-Treatment: Necessary to address cognition/behavior during functional activity;Complexity of the patient's impairments (multi-system involvement);For patient/therapist safety PT goals addressed during session: Mobility/safety with mobility OT goals addressed during session: ADL's and self-care      AM-PAC OT "6 Clicks" Daily Activity     Outcome Measure Help from another person eating meals?: A Little Help from another person taking care of personal grooming?: A Little Help from another person toileting, which includes using toliet, bedpan, or urinal?: A Lot Help from another person bathing (including washing, rinsing, drying)?: A Lot Help from another person to put on and taking off regular upper body clothing?: A Little Help from another person to put on and taking off regular lower body clothing?: A Lot 6 Click Score: 15   End of Session Equipment Utilized During Treatment: Gait belt;Rolling  walker (2 wheels)  Activity Tolerance: Patient limited by pain;Patient tolerated treatment well Patient left: in chair;with call bell/phone within reach;with chair alarm set;Other (comment) (Pt left with a mit on R hand to prevent pulling out IV and lap belt in place.)  OT Visit Diagnosis: Muscle weakness (generalized) (M62.81);Unsteadiness on feet (R26.81)                Time: 8889-1694 OT Time Calculation (min): 39 min Charges:  OT  General Charges $OT Visit: 1 Visit OT Evaluation $OT Eval Moderate Complexity: 1 Mod OT Treatments $Self Care/Home Management : 8-22 mins  [employer], OTDS  [employer] 10/01/2021, 12:55 PM

## 2021-10-01 NOTE — Progress Notes (Signed)
RE: Michael Stevens Date of Birth:  Aug 24, 2040 Date: 10/01/21   To Whom It May Concern:  Please be advised that the above-named patient will require a short-term nursing home stay - anticipated 30 days or less for rehabilitation and strengthening.  The plan is for return home.

## 2021-10-02 DIAGNOSIS — D509 Iron deficiency anemia, unspecified: Secondary | ICD-10-CM

## 2021-10-02 DIAGNOSIS — I10 Essential (primary) hypertension: Secondary | ICD-10-CM | POA: Diagnosis not present

## 2021-10-02 DIAGNOSIS — G20 Parkinson's disease: Secondary | ICD-10-CM | POA: Diagnosis not present

## 2021-10-02 DIAGNOSIS — S72001A Fracture of unspecified part of neck of right femur, initial encounter for closed fracture: Secondary | ICD-10-CM | POA: Diagnosis not present

## 2021-10-02 LAB — BASIC METABOLIC PANEL
Anion gap: 5 (ref 5–15)
BUN: 20 mg/dL (ref 8–23)
CO2: 24 mmol/L (ref 22–32)
Calcium: 8.6 mg/dL — ABNORMAL LOW (ref 8.9–10.3)
Chloride: 106 mmol/L (ref 98–111)
Creatinine, Ser: 0.88 mg/dL (ref 0.61–1.24)
GFR, Estimated: >60 mL/min (ref >60)
Glucose, Bld: 91 mg/dL (ref 70–99)
Potassium: 3.9 mmol/L (ref 3.5–5.1)
Sodium: 135 mmol/L (ref 135–145)

## 2021-10-02 LAB — CBC
HCT: 25.7 % — ABNORMAL LOW (ref 39.0–52.0)
Hemoglobin: 8.6 g/dL — ABNORMAL LOW (ref 13.0–17.0)
MCH: 30.4 pg (ref 26.0–34.0)
MCHC: 33.5 g/dL (ref 30.0–36.0)
MCV: 90.8 fL (ref 80.0–100.0)
Platelets: 195 10*3/uL (ref 150–400)
RBC: 2.83 MIL/uL — ABNORMAL LOW (ref 4.22–5.81)
RDW: 18.6 % — ABNORMAL HIGH (ref 11.5–15.5)
WBC: 14.6 10*3/uL — ABNORMAL HIGH (ref 4.0–10.5)
nRBC: 0 % (ref 0.0–0.2)

## 2021-10-02 LAB — MAGNESIUM: Magnesium: 2 mg/dL (ref 1.7–2.4)

## 2021-10-02 MED ORDER — SENNOSIDES-DOCUSATE SODIUM 8.6-50 MG PO TABS
2.0000 | ORAL_TABLET | Freq: Two times a day (BID) | ORAL | Status: DC
  Administered 2021-10-02 – 2021-10-05 (×7): 2 via ORAL
  Filled 2021-10-02 (×7): qty 2

## 2021-10-02 NOTE — Plan of Care (Signed)

## 2021-10-02 NOTE — Progress Notes (Signed)
Physical Therapy Treatment Patient Details Name: Michael Stevens MRN: [id number] DOB: 07-13-1940 Today's Date: 10/02/2021   History of Present Illness Pt is 81 yo M s/p R hemiarthoplasty 09/30/21. PMH includes dementia, parkinson's disease, anemia, GERD, HTN    PT Comments    Pt was sitting in recliner with abduction pillow in place. He is oriented to self only but was cooperative and pleasantly confused. Is able to follow simple commands with constant cueing. He is unaware he is in the hospital or that he fx R LE. Endorses no pain at rest but slight discomfort with standing activity. Was able to ambulate to doorway of room with recliner follow for safety. Pt tends to take shuffling steps but with cueing can correct. Tolerated several there ex prior to breakfast tray arriving. Author highly recommends DC to SNF to maximize independence while decreasing caregiver burden.     Recommendations for follow up therapy are one component of a multi-disciplinary discharge planning process, led by the attending physician.  Recommendations may be updated based on patient status, additional functional criteria and insurance authorization.  Follow Up Recommendations  Skilled nursing-short term rehab (<3 hours/day)     Assistance Recommended at Discharge Frequent or constant Supervision/Assistance  Patient can return home with the following A lot of help with walking and/or transfers;A lot of help with bathing/dressing/bathroom;Assistance with cooking/housework;Assistance with feeding;Direct supervision/assist for medications management;Direct supervision/assist for financial management;Assist for transportation;Help with stairs or ramp for entrance   Equipment Recommendations  BSC/3in1;Rolling walker (2 wheels)       Precautions / Restrictions Precautions Precautions: Posterior Hip;Fall Precaution Comments: Adductor pillow while seated in recliner or bed Restrictions Weight Bearing Restrictions:  Yes RLE Weight Bearing: Weight bearing as tolerated     Mobility  Bed Mobility  General bed mobility comments: in recliner pre/post session. posey belt for chair alarm on pre/post session    Transfers Overall transfer level: Needs assistance Equipment used: Rolling walker (2 wheels) Transfers: Sit to/from Stand Sit to Stand: Min assist  General transfer comment: pt performed STS 4 x from recliner with min assist of one.    Ambulation/Gait Ambulation/Gait assistance: Min assist Gait Distance (Feet): 30 Feet Assistive device: Rolling walker (2 wheels) Gait Pattern/deviations: Step-through pattern, Shuffle, Decreased stride length Gait velocity: decreased     General Gait Details: pt tends to take short shuffling steps but when cued is able to correct. Also required constant vcs for posture correction. Chair follow for safety. Easily able to ambulate to doorway. distance limited by breakfast tray arriving.   Balance Overall balance assessment: Needs assistance Sitting-balance support: Bilateral upper extremity supported, Feet supported Sitting balance-Leahy Scale: Fair     Standing balance support: Bilateral upper extremity supported, During functional activity, Reliant on assistive device for balance Standing balance-Leahy Scale: Fair       Cognition Arousal/Alertness: Awake/alert Behavior During Therapy: WFL for tasks assessed/performed Overall Cognitive Status: History of cognitive impairments - at baseline      General Comments: pt is oriented to self only. unaware he is in hospital or that he fx leg           General Comments General comments (skin integrity, edema, etc.): prior to ambulation, pt perform marching in place, seated LAQ, and author reviewed what abduction pillow was for. due to poor cognition, pt will need re-education every session. is able to maintain hip precaution with constant vcs      Pertinent Vitals/Pain Pain Assessment Pain Assessment:  No/denies pain  PT Goals (current goals can now be found in the care plan section) Acute Rehab PT Goals Patient Stated Goal: none stated Progress towards PT goals: Progressing toward goals    Frequency    7X/week      PT Plan Current plan remains appropriate    Co-evaluation     PT goals addressed during session: Mobility/safety with mobility;Balance;Proper use of DME;Strengthening/ROM        AM-PAC PT "6 Clicks" Mobility   Outcome Measure  Help needed turning from your back to your side while in a flat bed without using bedrails?: A Little Help needed moving from lying on your back to sitting on the side of a flat bed without using bedrails?: A Little Help needed moving to and from a bed to a chair (including a wheelchair)?: A Lot Help needed standing up from a chair using your arms (e.g., wheelchair or bedside chair)?: A Lot Help needed to walk in hospital room?: A Lot Help needed climbing 3-5 steps with a railing? : A Lot 6 Click Score: 14    End of Session   Activity Tolerance: Patient tolerated treatment well Patient left: in chair;with call bell/phone within reach;Other (comment);with chair alarm set (posey belt alarm) Nurse Communication: Mobility status PT Visit Diagnosis: Other abnormalities of gait and mobility (R26.89);Unsteadiness on feet (R26.81);Muscle weakness (generalized) (M62.81)     Time: 0263-7858 PT Time Calculation (min) (ACUTE ONLY): 22 min  Charges:  $Therapeutic Activity: 8-22 mins                     Julaine Fusi PTA 10/02/21, 9:42 AM

## 2021-10-02 NOTE — TOC Progression Note (Addendum)
Transition of Care [facility]) - Progression Note    Patient Details  Name: Michael Stevens MRN: [id number] Date of Birth: 06-Mar-1941  Transition of Care [facility]) CM/SW [place], LCSW Phone Number: 10/02/2021, 9:05 AM  Clinical Narrative:   Patient has a bed offer at [facility]. CSW called patient's wife and presented bed offer. She said her top choice is [facility] which is still pending. CSW reached out to Seth Bake at [facility] and asked her to review referral. Patient's wife stated if [facility] cannot accept, she would be ok with [facility].   10:03- Per Seth Bake at [facility], they have no beds. Called patietn's wife who is agreeable to [facility]. CSW notified Magda Paganini at [facility]. Asked if she can take patient Monday.   2:11- Per Magda Paganini at [facility], they will have a bed for patient on Tuesday pending auth. CSW started auth in [place] portal.    Expected Discharge Plan: [place] Barriers to Discharge: Continued Medical Work up  Expected Discharge Plan and Services Expected Discharge Plan: [place] arrangements for the past 2 months: Single Family Home                                       Social Determinants of Health (SDOH) Interventions    Readmission Risk Interventions     No data to display
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
## 2021-10-02 NOTE — Plan of Care (Signed)
  Problem: Safety: Goal: Ability to remain free from injury will improve Outcome: Progressing   

## 2021-10-02 NOTE — Progress Notes (Signed)
Subjective: Mr. Zukas is postop day 2 after right hip hemiarthroplasty.  Sitting in bedside chair.  Doing well no pain.  Objective: Vital signs in last 24 hours: Temp:  [98.1 F (36.7 C)-98.9 F (37.2 C)] 98.1 F (36.7 C) (06/17 0840) Pulse Rate:  [91-103] 92 (06/17 0840) Resp:  [16-20] 16 (06/17 0840) BP: (125-162)/(60-71) 162/71 (06/17 0840) SpO2:  [98 %-100 %] 100 % (06/17 0840)  Intake/Output from previous day: 06/16 0701 - 06/17 0700 In: -  Out: 1300 [Urine:1300] Intake/Output this shift: Total I/O In: 2544.1 [P.O.:480; I.V.:2064.1] Out: 300 [Urine:300]  General: Pleasant Right lower extremity exam is grossly motor and sensory intact Aquacel dressing is intact, minimal serosanguineous drainage  Lab Results Recent Labs    10/01/21 0451 10/02/21 0407  WBC 19.5* 14.6*  HGB 8.8* 8.6*  HCT 26.2* 25.7*  NA 137 135  K 4.1 3.9  CL 109 106  CO2 22 24  BUN 17 20  CREATININE 0.81 0.88    Liver Panel Recent Labs    09/30/21 0152  PROT 6.8  ALBUMIN 3.6  AST 19  ALT 17  ALKPHOS 67  BILITOT 1.2    Sedimentation Rate No results for input(s): "ESRSEDRATE" in the last 72 hours. C-Reactive Protein No results for input(s): "CRP" in the last 72 hours.  Microbiology: Recent Results (from the past 240 hour(s))  Urine Culture     Status: None   Collection Time: 09/30/21  2:14 AM   Specimen: Urine, Catheterized  Result Value Ref Range Status   Specimen Description   Final    URINE, CATHETERIZED Performed at [facility], [address], [place], [place] [address]    Special Requests   Final    NONE Performed at [facility], [address], [place], [place] [address]    Culture   Final    NO GROWTH Performed at [facility], [place] [address] [place], [place] [address]    Report Status 10/01/2021 FINAL  Final    Studies/Results: Pelvis Portable  Result Date: 09/30/2021 CLINICAL DATA:  Postop. EXAM: PORTABLE PELVIS 1-2 VIEWS  COMPARISON:  Preoperative imaging. FINDINGS: Right hip arthroplasty in expected alignment. No periprosthetic lucency or fracture. Recent postsurgical change includes air and edema in the soft tissues. Lateral skin staples in place. IMPRESSION: Right hip arthroplasty without immediate postoperative complication. Electronically Signed   By: Keith Rake M.D.   On: 09/30/2021 16:00    Medications: Reviewed  Assessment/Plan: Mr. Fina is postop day 2 right hip hemiarthroplasty Appreciate hospitalist team support  1.  Patient will be weightbearing as tolerated with posterior hip precautions. 2.  DVT prophylaxis can start on postoperative day #1, recommend Aspirin '325mg'$  BID 3.  Keep dressing in place unless saturated 4.  Follow-up in clinic in approximately 2 weeks for staple removal and x-rays   Renee Harder 10/02/2021, 3:35 PM

## 2021-10-03 DIAGNOSIS — I10 Essential (primary) hypertension: Secondary | ICD-10-CM | POA: Diagnosis not present

## 2021-10-03 DIAGNOSIS — S72001A Fracture of unspecified part of neck of right femur, initial encounter for closed fracture: Secondary | ICD-10-CM | POA: Diagnosis not present

## 2021-10-03 DIAGNOSIS — G20 Parkinson's disease: Secondary | ICD-10-CM | POA: Diagnosis not present

## 2021-10-03 MED ORDER — FERROUS SULFATE 325 (65 FE) MG PO TABS
325.0000 mg | ORAL_TABLET | Freq: Every day | ORAL | Status: DC
Start: 2021-10-03 — End: 2021-10-05
  Administered 2021-10-03 – 2021-10-05 (×3): 325 mg via ORAL
  Filled 2021-10-03 (×3): qty 1

## 2021-10-03 NOTE — Plan of Care (Signed)

## 2021-10-03 NOTE — Progress Notes (Signed)
Patient try to get out of bed and chair multiple times thought the day, despite attempts to reorient.

## 2021-10-03 NOTE — Progress Notes (Signed)
Physical Therapy Treatment Patient Details Name: Michael Stevens MRN: [id number] DOB: 01-21-1941 Today's Date: 10/03/2021   History of Present Illness Pt is 81 yo M s/p R hemiarthoplasty 09/30/21. PMH includes dementia, parkinson's disease, anemia, GERD, HTN    PT Comments    Pt ready for session.  To EOB with increased time and light min a x 1.  Stood and is able to walk 10' in room with RW and min a x 1.  Generally unsteady gait and poor quality.  Further gait was not attempted as +2 assist was not available at the time but extra time was spent on standing AROM.  Fatigues in standing but finishes with seated AROM  Remained in recliner with breakfast tray set up.  Pt confused.  Stated he is 20 you and his grandmother who is 46 is looking for him.  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  Re-orientated.   Recommendations for follow up therapy are one component of a multi-disciplinary discharge planning process, led by the attending physician.  Recommendations may be updated based on patient status, additional functional criteria and insurance authorization.  Follow Up Recommendations  Skilled nursing-short term rehab (<3 hours/day)     Assistance Recommended at Discharge Frequent or constant Supervision/Assistance  Patient can return home with the following A lot of help with walking and/or transfers;A lot of help with bathing/dressing/bathroom;Assistance with cooking/housework;Assistance with feeding;Direct supervision/assist for medications management;Direct supervision/assist for financial management;Assist for transportation;Help with stairs or ramp for entrance   Equipment Recommendations  BSC/3in1;Rolling walker (2 wheels)    Recommendations for Other Services       Precautions / Restrictions Precautions Precautions: Posterior Hip;Fall Precaution Comments: Adductor pillow while seated in recliner or bed Restrictions Weight  Bearing Restrictions: Yes RLE Weight Bearing: Weight bearing as tolerated     Mobility  Bed Mobility               General bed mobility comments: in recliner pre/post session. posey belt for chair alarm on pre/post session    Transfers Overall transfer level: Needs assistance Equipment used: Rolling walker (2 wheels) Transfers: Sit to/from Stand Sit to Stand: Min assist           General transfer comment: pt performed STS 4 x from recliner with min assist of one.    Ambulation/Gait Ambulation/Gait assistance: Min assist Gait Distance (Feet): 10 Feet Assistive device: Rolling walker (2 wheels) Gait Pattern/deviations: Step-through pattern, Shuffle, Decreased stride length Gait velocity: decreased     General Gait Details: pt tends to take short shuffling steps but when cued is able to correct. Also required constant vcs for posture correction. Chair follow for safety. Easily able to ambulate to doorway. distance limited by breakfast tray arriving.   Stairs             Wheelchair Mobility    Modified Rankin (Stroke Patients Only)       Balance Overall balance assessment: Needs assistance Sitting-balance support: Bilateral upper extremity supported, Feet supported Sitting balance-Leahy Scale: Poor     Standing balance support: Bilateral upper extremity supported, During functional activity, Reliant on assistive device for balance Standing balance-Leahy Scale: Fair                              Cognition Arousal/Alertness: Awake/alert Behavior During Therapy: WFL for tasks assessed/performed Overall Cognitive Status: History of cognitive impairments -  at baseline                                 General Comments: reports being 81 yo and 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[employer], retail Other Exercises Other Exercises: standing and seated AROM    General Comments        Pertinent Vitals/Pain Pain Assessment Pain  Assessment: Faces Faces Pain Scale: Hurts a little bit Pain Location: hip Pain Descriptors / Indicators: Sore Pain Intervention(s): Monitored during session, Repositioned    Home Living                          Prior Function            PT Goals (current goals can now be found in the care plan section) Progress towards PT goals: Progressing toward goals    Frequency    7X/week      PT Plan Current plan remains appropriate    Co-evaluation              AM-PAC PT "6 Clicks" Mobility   Outcome Measure  Help needed turning from your back to your side while in a flat bed without using bedrails?: A Little Help needed moving from lying on your back to sitting on the side of a flat bed without using bedrails?: A Little   Help needed standing up from a chair using your arms (e.g., wheelchair or bedside chair)?: A Lot Help needed to walk in hospital room?: A Lot Help needed climbing 3-5 steps with a railing? : A Lot 6 Click Score: 12    End of Session Equipment Utilized During Treatment: Gait belt Activity Tolerance: Patient tolerated treatment well Patient left: in chair;with call bell/phone within reach;with chair alarm set Nurse Communication: Mobility status PT Visit Diagnosis: Other abnormalities of gait and mobility (R26.89);Unsteadiness on feet (R26.81);Muscle weakness (generalized) (M62.81)     Time: 9323-5573 PT Time Calculation (min) (ACUTE ONLY): 14 min  Charges:  $Gait Training: 8-22 mins                    Chesley Noon, PTA 10/03/21, 12:50 PM
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
## 2021-10-03 NOTE — Plan of Care (Signed)
  Problem: Pain Managment: Goal: General experience of comfort will improve Outcome: Progressing   Problem: Safety: Goal: Ability to remain free from injury will improve Outcome: Progressing   

## 2021-10-03 NOTE — TOC Progression Note (Addendum)
Transition of Care [facility]) - Progression Note    Patient Details  Name: Michael Stevens MRN: [id number] Date of Birth: [birth year]  Transition of Care [facility]) CM/SW [place], LCSW Phone Number: 10/03/2021, 9:24 AM  Clinical Narrative:   Currently Josem Kaufmann is still pending in [facility] portal.  CSW called patient's wife and updated her that [facility] will have a bed for patient on Tuesday pending auth approval.   9:58- Auth approved in [facility].   Expected Discharge Plan: [place] Barriers to Discharge: Continued Medical Work up  Expected Discharge Plan and Services Expected Discharge Plan: [place] arrangements for the past 2 months: Single Family Home                                       Social Determinants of Health (SDOH) Interventions    Readmission Risk Interventions     No data to display

## 2021-10-04 LAB — CBC
HCT: 24.8 % — ABNORMAL LOW (ref 39.0–52.0)
Hemoglobin: 8.6 g/dL — ABNORMAL LOW (ref 13.0–17.0)
MCH: 30.4 pg (ref 26.0–34.0)
MCHC: 34.7 g/dL (ref 30.0–36.0)
MCV: 87.6 fL (ref 80.0–100.0)
Platelets: 246 10*3/uL (ref 150–400)
RBC: 2.83 MIL/uL — ABNORMAL LOW (ref 4.22–5.81)
RDW: 18.6 % — ABNORMAL HIGH (ref 11.5–15.5)
WBC: 10.7 10*3/uL — ABNORMAL HIGH (ref 4.0–10.5)
nRBC: 0 % (ref 0.0–0.2)

## 2021-10-04 LAB — SURGICAL PATHOLOGY

## 2021-10-04 MED ORDER — HALOPERIDOL LACTATE 5 MG/ML IJ SOLN: 1.0000 mg | Freq: Four times a day (QID) | INTRAMUSCULAR | Status: DC | PRN

## 2021-10-04 NOTE — TOC Progression Note (Signed)
Transition of Care [facility]) - Progression Note    Patient Details  Name: Michael Stevens MRN: [id number] Date of Birth: 1940/10/15  Transition of Care [facility]) CM/SW [place], RN Phone Number: 10/04/2021, 11:28 AM  Clinical Narrative:   Josem Kaufmann approved [id number] SNF 10/02/2021 10/04/2021-10/06/2021  Magda Paganini from [place] can take tomorrow.   Expected Discharge Plan: [place] Barriers to Discharge: Continued Medical Work up  Expected Discharge Plan and Services Expected Discharge Plan: [place] arrangements for the past 2 months: Single Family Home                                       Social Determinants of Health (SDOH) Interventions    Readmission Risk Interventions     No data to display
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
## 2021-10-04 NOTE — Progress Notes (Signed)
Physical Therapy Treatment Patient Details Name: Michael Stevens MRN: [id number] DOB: Apr 19, 1940 Today's Date: 10/04/2021   History of Present Illness Pt is 81 yo M s/p R hemiarthoplasty 09/30/21. PMH includes dementia, parkinson's disease, anemia, GERD, HTN    PT Comments    Pt ready for session.  Participated in exercises as described below.  To EOB with min guard/ verbal cues for encouragement.  Steady in sitting but unsafe to be left unattended for safety.  Stands and struggles to walk 5' to [facility] for unsuccessful BM attempt.  He then walks 3' to recliner positioned nearby.  Overall gait quality decreased today with more shuffling steps and seemed to have trouble coordinating movements.  Reaching for objects before fully turned.  He does have Hx of Parkinson's which does explain gait deficits.  Remained in chair with needs met and safety precautions in place.  Communicated with tech.   Recommendations for follow up therapy are one component of a multi-disciplinary discharge planning process, led by the attending physician.  Recommendations may be updated based on patient status, additional functional criteria and insurance authorization.  Follow Up Recommendations  Skilled nursing-short term rehab (<3 hours/day)     Assistance Recommended at Discharge Frequent or constant Supervision/Assistance  Patient can return home with the following A lot of help with walking and/or transfers;A lot of help with bathing/dressing/bathroom;Assistance with cooking/housework;Assistance with feeding;Direct supervision/assist for medications management;Direct supervision/assist for financial management;Assist for transportation;Help with stairs or ramp for entrance   Equipment Recommendations       Recommendations for Other Services       Precautions / Restrictions Precautions Precautions: Posterior Hip;Fall Precaution Comments: Adductor pillow while seated in recliner or bed Restrictions Weight Bearing  Restrictions: Yes RLE Weight Bearing: Weight bearing as tolerated     Mobility  Bed Mobility Overal bed mobility: Needs Assistance Bed Mobility: Supine to Sit     Supine to sit: Min guard, Min assist          Transfers Overall transfer level: Needs assistance Equipment used: Rolling walker (2 wheels) Transfers: Sit to/from Stand Sit to Stand: Min assist                Ambulation/Gait Ambulation/Gait assistance: Min assist, Mod assist Gait Distance (Feet): 5 Feet Assistive device: Rolling walker (2 wheels) Gait Pattern/deviations: Step-through pattern, Shuffle, Decreased stride length Gait velocity: decreased     General Gait Details: decreased gait quality today, generally unsafe   Stairs             Wheelchair Mobility    Modified Rankin (Stroke Patients Only)       Balance Overall balance assessment: Needs assistance Sitting-balance support: Bilateral upper extremity supported, Feet supported Sitting balance-Leahy Scale: Fair     Standing balance support: Bilateral upper extremity supported, During functional activity, Reliant on assistive device for balance Standing balance-Leahy Scale: Poor Standing balance comment: increased difficulty today                            Cognition Arousal/Alertness: Awake/alert Behavior During Therapy: WFL for tasks assessed/performed Overall Cognitive Status: History of cognitive impairments - at baseline                                          Exercises Other Exercises Other Exercises: supine AROM    General Comments  Pertinent Vitals/Pain Pain Assessment Pain Assessment: Faces Faces Pain Scale: Hurts a little bit Pain Location: hip Pain Descriptors / Indicators: Sore Pain Intervention(s): Monitored during session, Repositioned    Home Living                          Prior Function            PT Goals (current goals can now be found in the  care plan section) Progress towards PT goals: Progressing toward goals    Frequency    7X/week      PT Plan Current plan remains appropriate    Co-evaluation              AM-PAC PT "6 Clicks" Mobility   Outcome Measure  Help needed turning from your back to your side while in a flat bed without using bedrails?: A Little Help needed moving from lying on your back to sitting on the side of a flat bed without using bedrails?: A Little Help needed moving to and from a bed to a chair (including a wheelchair)?: A Little Help needed standing up from a chair using your arms (e.g., wheelchair or bedside chair)?: A Little Help needed to walk in hospital room?: A Lot Help needed climbing 3-5 steps with a railing? : Total 6 Click Score: 15    End of Session Equipment Utilized During Treatment: Gait belt Activity Tolerance: Patient tolerated treatment well Patient left: in chair;with call bell/phone within reach;with chair alarm set Nurse Communication: Mobility status PT Visit Diagnosis: Other abnormalities of gait and mobility (R26.89);Unsteadiness on feet (R26.81);Muscle weakness (generalized) (M62.81)     Time: 2725-3664 PT Time Calculation (min) (ACUTE ONLY): 17 min  Charges:  $Gait Training: 8-22 mins                   Chesley Noon, PTA 10/04/21, 9:30 AM

## 2021-10-04 NOTE — Progress Notes (Signed)
Occupational Therapy Treatment Patient Details Name: Michael Stevens MRN: [id number] DOB: 1940-12-14 Today's Date: 10/04/2021   History of present illness Pt is 81 yo M s/p R hemiarthoplasty 09/30/21. PMH includes dementia, parkinson's disease, anemia, GERD, HTN   OT comments  Pt seen for OT tx. Pt pleasant, confused, but able to follow simple commands this date with VC. Pt instructed in posterior hip precautions, able to immediately recall 2/3, unable to recall precautions with delayed recall and required VC for precautions with bed mobility and ADL transfer training. Pt required MIN A for bed mobility and ADL transfers. Pt instructed in IS use and required MAX VC for sequencing throughout. Pt denied pain throughout. Continue to recommend SNF at this time.    Recommendations for follow up therapy are one component of a multi-disciplinary discharge planning process, led by the attending physician.  Recommendations may be updated based on patient status, additional functional criteria and insurance authorization.    Follow Up Recommendations  Skilled nursing-short term rehab (<3 hours/day)    Assistance Recommended at Discharge Frequent or constant Supervision/Assistance  Patient can return home with the following  A lot of help with bathing/dressing/bathroom;Assistance with cooking/housework;Assist for transportation;A lot of help with walking and/or transfers;Direct supervision/assist for medications management;Direct supervision/assist for financial management;Help with stairs or ramp for entrance   Equipment Recommendations  BSC/3in1    Recommendations for Other Services      Precautions / Restrictions Precautions Precautions: Posterior Hip;Fall Precaution Comments: Adductor pillow while seated in recliner or bed Restrictions Weight Bearing Restrictions: Yes RLE Weight Bearing: Weight bearing as tolerated Other Position/Activity Restrictions: adduction pillow at rest as pt unable to  recall hip precautions       Mobility Bed Mobility Overal bed mobility: Needs Assistance Bed Mobility: Supine to Sit, Sit to Supine     Supine to sit: Min assist, Min guard, HOB elevated Sit to supine: Min guard, Min assist   General bed mobility comments: VC for sequencing to maintain precautions    Transfers Overall transfer level: Needs assistance Equipment used: Rolling walker (2 wheels) Transfers: Sit to/from Stand Sit to Stand: Min assist           General transfer comment: VC for technique/sequencing to maintain precautions     Balance Overall balance assessment: Needs assistance Sitting-balance support: Single extremity supported, Bilateral upper extremity supported, Feet supported Sitting balance-Leahy Scale: Fair     Standing balance support: Bilateral upper extremity supported, During functional activity Standing balance-Leahy Scale: Poor                             ADL either performed or assessed with clinical judgement   ADL Overall ADL's : Needs assistance/impaired                                       General ADL Comments: set up and supv for seated grooming this date.    Extremity/Trunk Assessment              Vision       Perception     Praxis      Cognition Arousal/Alertness: Awake/alert Behavior During Therapy: WFL for tasks assessed/performed Overall Cognitive Status: History of cognitive impairments - at baseline  General Comments: pt alert, pleasant and confused. Unable to recall precautions. Does follow simple 1 step commands with PRN VC        Exercises Other Exercises Other Exercises: Pt instructed in IS and required MAX VC for sequencing to use Other Exercises: Pt instructed in posterior hip precautions, able to immediately recall 2/3, unable to recall precautions with delayed recall    Shoulder Instructions       General Comments       Pertinent Vitals/ Pain       Pain Assessment Pain Assessment: Faces Faces Pain Scale: No hurt  Home Living                                          Prior Functioning/Environment              Frequency  Min 2X/week        Progress Toward Goals  OT Goals(current goals can now be found in the care plan section)  Progress towards OT goals: Progressing toward goals  Acute Rehab OT Goals Patient Stated Goal: to improve pain OT Goal Formulation: With patient Time For Goal Achievement: 10/15/21 Potential to Achieve Goals: Good  Plan Discharge plan remains appropriate;Frequency remains appropriate    Co-evaluation                 AM-PAC OT "6 Clicks" Daily Activity     Outcome Measure   Help from another person eating meals?: A Little Help from another person taking care of personal grooming?: A Little Help from another person toileting, which includes using toliet, bedpan, or urinal?: A Lot Help from another person bathing (including washing, rinsing, drying)?: A Lot Help from another person to put on and taking off regular upper body clothing?: A Little Help from another person to put on and taking off regular lower body clothing?: A Lot 6 Click Score: 15    End of Session Equipment Utilized During Treatment: Rolling walker (2 wheels)  OT Visit Diagnosis: Muscle weakness (generalized) (M62.81);Unsteadiness on feet (R26.81)   Activity Tolerance Patient tolerated treatment well   Patient Left in bed;with call bell/phone within reach;with bed alarm set;Other (comment) (wedge pillow in place)   Nurse Communication          Time: 2094-7096 OT Time Calculation (min): 13 min  Charges: OT General Charges $OT Visit: 1 Visit OT Treatments $Self Care/Home Management : 8-22 mins  Ardeth Perfect., MPH, MS, OTR/L ascom [phone] 10/04/21, 3:56 PM

## 2021-10-04 NOTE — Progress Notes (Signed)
Subjective: Mr. Rockefeller is postop day 4 after right hip hemiarthroplasty.  Resting in bed.  Doing well no pain.  Objective: Vital signs in last 24 hours: Temp:  [97.7 F (36.5 C)-98 F (36.7 C)] 98 F (36.7 C) (06/19 0333) Pulse Rate:  [84-105] 100 (06/19 0333) Resp:  [15-16] 16 (06/19 0333) BP: (123-152)/(61-70) 152/70 (06/19 0333) SpO2:  [100 %] 100 % (06/19 0333)  Intake/Output from previous day: 06/18 0701 - 06/19 0700 In: 720 [P.O.:720] Out: 2700 [Urine:2700] Intake/Output this shift: No intake/output data recorded.  General: Pleasant Right lower extremity exam is grossly motor and sensory intact Aquacel dressing is intact, minimal serosanguineous drainage  Lab Results Recent Labs    10/02/21 0407 10/04/21 0453  WBC 14.6* 10.7*  HGB 8.6* 8.6*  HCT 25.7* 24.8*  NA 135  --   K 3.9  --   CL 106  --   CO2 24  --   BUN 20  --   CREATININE 0.88  --     Liver Panel No results for input(s): "PROT", "ALBUMIN", "AST", "ALT", "ALKPHOS", "BILITOT", "BILIDIR", "IBILI" in the last 72 hours.  Sedimentation Rate No results for input(s): "ESRSEDRATE" in the last 72 hours. C-Reactive Protein No results for input(s): "CRP" in the last 72 hours.  Microbiology: Recent Results (from the past 240 hour(s))  Urine Culture     Status: None   Collection Time: 09/30/21  2:14 AM   Specimen: Urine, Catheterized  Result Value Ref Range Status   Specimen Description   Final    URINE, CATHETERIZED Performed at [facility], [address], [place], [place] [address]    Special Requests   Final    NONE Performed at [facility], [address], [place], [place] [address]    Culture   Final    NO GROWTH Performed at [facility], [place] [address], [place], [place] [address]    Report Status 10/01/2021 FINAL  Final    Studies/Results: No results found.  Medications: Reviewed  Assessment/Plan: Mr. Aull is postop day 4 right hip  hemiarthroplasty Appreciate hospitalist team support  1.  Patient will be weightbearing as tolerated with posterior hip precautions. PT recommends SNF 2.  DVT prophylaxis - recommend Aspirin '325mg'$  BID 3.  Keep dressing in place unless saturated 4.  Follow-up in clinic in approximately 2 weeks for staple removal and x-rays   Renee Harder 10/04/2021, 7:53 AM

## 2021-10-04 NOTE — Plan of Care (Signed)

## 2021-10-04 NOTE — Progress Notes (Signed)
Notified MD Roosevelt Locks of patient having increasing restlessness and continues to try to get up unassisted. Assisted patient back to bed. And medicated with prn pain medication will continue to monitor. Provider stated he would place new orders for Haldol q 6  PRN. Bed remains in a low position and bed alarm on and active.

## 2021-10-04 NOTE — Care Management Important Message (Signed)
Important Message  Patient Details  Name: Michael Stevens MRN: [id number] Date of Birth: 11-02-1940   Medicare Important Message Given:  N/A - LOS <3 / Initial given by admissions 6/17 @ 3:07 pm     [name] [name] 10/04/2021, 10:33 AM

## 2021-10-05 MED ORDER — SENNOSIDES-DOCUSATE SODIUM 8.6-50 MG PO TABS: 2.0000 | ORAL_TABLET | Freq: Two times a day (BID) | ORAL | Status: AC | PRN

## 2021-10-05 MED ORDER — ASPIRIN 325 MG PO TBEC: 325.0000 mg | DELAYED_RELEASE_TABLET | Freq: Two times a day (BID) | ORAL | 0 refills | Status: AC

## 2021-10-05 MED ORDER — FERROUS SULFATE 325 (65 FE) MG PO TABS: 325.0000 mg | ORAL_TABLET | Freq: Every day | ORAL | 3 refills | Status: AC

## 2021-10-05 MED ORDER — HYDROCODONE-ACETAMINOPHEN 5-325 MG PO TABS: 1.0000 | ORAL_TABLET | ORAL | 0 refills | Status: AC | PRN

## 2021-10-05 NOTE — Plan of Care (Signed)

## 2021-10-05 NOTE — Care Management Important Message (Signed)
Important Message  Patient Details  Name: Michael Stevens MRN: [id number] Date of Birth: 1940/09/17   Medicare Important Message Given:  Yes     [name] 10/05/2021, 10:58 AM

## 2021-10-05 NOTE — NC FL2 (Signed)
[place] LEVEL OF CARE SCREENING TOOL     IDENTIFICATION  Patient Name: Michael Stevens Birthdate: [birth year] Sex: male Admission Date (Current Location): 09/30/2021  [facility] and [place] Number:  Engineering geologist and Address:  [facility], [address] [place], [place] [address]      Provider Number: [id number]  Attending Physician Name and Address:  Sharen Hones, MD  Relative Name and Phone Number:  Antonie, Borjon (Spouse)   [phone] (Mobile)    Current Level of Care: Hospital Recommended Level of Care: [place] Prior Approval Number:    Date Approved/Denied:   PASRR Number: pending, clinical sent  Discharge Plan: SNF    Current Diagnoses: Patient Active Problem List   Diagnosis Date Noted   Iron deficiency anemia 10/02/2021   Hip fracture ([place]) 09/30/2021   Closed displaced fracture of right femoral neck ([place])    Chronic cough 09/15/2021   Chronic right-sided low back pain with right-sided sciatica 03/26/2020   Lumbar stenosis with neurogenic claudication 03/26/2020   Parkinson's disease ([place]) 02/12/2020   Muscle stiffness 07/04/2019   B12 deficiency 02/27/2019   Memory loss, short term 02/27/2019   History of oral cancer 02/11/2019   Bilateral impacted cerumen 11/12/2018   Oral cancer ([place]) 09/03/2018   At risk for falling 04/05/2018   Screening for gout 04/05/2018   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   Tremor 11/27/2017   Leg pain, bilateral 11/07/2017   Neuropathy 11/07/2017   Duodenal ulcer with hemorrhage 09/05/2017   Abnormal EKG 01/31/2017   Right bundle branch block 01/31/2017   Chronic thoracic back pain 01/31/2017   BPH associated with nocturia 06/21/2016   Hyperglycemia 12/22/2015   DDD (degenerative disc disease), lumbar 07/14/2015   Internal hemorrhoids 02/11/2015   Male erectile dysfunction, unspecified 02/11/2015   Anemia 01/14/2015   Allergic rhinitis 10/13/2014   Bladder  neck obstruction 10/13/2014   Diverticulosis of intestine 10/13/2014   Gastric catarrh 10/13/2014   Gastroesophageal reflux disease 10/13/2014   Glaucoma 10/13/2014   Hypercholesteremia 10/13/2014   Essential hypertension 10/13/2014   Occult blood in stools 02/06/2014   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   Neoplasm of uncertain behavior of lip, oral cavity, 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    Orientation RESPIRATION BLADDER Height & Weight     Self    External catheter, Incontinent Weight: 64.2 kg Height:  '5\' 5"'$  (165.1 cm)  BEHAVIORAL SYMPTOMS/MOOD NEUROLOGICAL BOWEL NUTRITION STATUS     (Impaired cognitive function)   Diet (Carb modified 1600-2000 cal)  AMBULATORY STATUS COMMUNICATION OF NEEDS Skin   Extensive Assist (+2 transfer) Verbally                         Personal Care Assistance Level of Assistance  Bathing, Feeding, Dressing Bathing Assistance: Limited assistance Feeding assistance: Limited assistance Dressing Assistance: Limited assistance (Max assist for lower body dressing)     Functional Limitations Info  Sight, Hearing, Speech Sight Info: Adequate Hearing Info: Adequate Speech Info: Adequate    SPECIAL CARE FACTORS FREQUENCY  PT (By licensed PT), OT (By licensed OT)     PT Frequency: Min 5x weekly OT Frequency: Min 5x weekly            Contractures Contractures Info: Not present    Additional Factors Info  Code Status, Allergies Code Status Info: DNR Allergies Info: No known allergies           Current Medications (10/05/2021):  This is the current hospital active medication list Current Facility-Administered Medications  Medication Dose Route Frequency Provider Last Rate Last Admin   acetaminophen (TYLENOL) tablet 325-650 mg  325-650 mg Oral Q6H PRN Renee Harder, MD       aspirin EC tablet 325 mg  325 mg Oral BID Renee Harder, MD   325 mg at 10/04/21 2234   carbidopa-levodopa (SINEMET IR) 25-250 MG per tablet immediate release 1  tablet  1 tablet Oral QID Renee Harder, MD   1 tablet at 10/04/21 2234   feeding supplement (ENSURE ENLIVE / ENSURE PLUS) liquid 237 mL  237 mL Oral BID BM Sharen Hones, MD   237 mL at 10/04/21 1416   ferrous sulfate tablet 325 mg  325 mg Oral Q breakfast Sharen Hones, MD   325 mg at 10/04/21 6384   haloperidol lactate (HALDOL) injection 1 mg  1 mg Intravenous Q6H PRN Sharen Hones, MD       HYDROcodone-acetaminophen [facility]) 7.5-325 MG per tablet 1-2 tablet  1-2 tablet Oral Q4H PRN Renee Harder, MD   2 tablet at 10/04/21 1648   HYDROcodone-acetaminophen (NORCO/VICODIN) 5-325 MG per tablet 1-2 tablet  1-2 tablet Oral Q4H PRN Renee Harder, MD   1 tablet at 10/04/21 0928   latanoprost (XALATAN) 0.005 % ophthalmic solution 1 drop  1 drop Both Eyes QHS Renee Harder, MD   1 drop at 10/04/21 2236   menthol-cetylpyridinium (CEPACOL) lozenge 3 mg  1 lozenge Oral PRN Renee Harder, MD       Or   phenol (CHLORASEPTIC) mouth spray 1 spray  1 spray Mouth/Throat PRN Renee Harder, MD       metoCLOPramide (REGLAN) tablet 5-10 mg  5-10 mg Oral Q8H PRN Renee Harder, MD       Or   metoCLOPramide (REGLAN) injection 5-10 mg  5-10 mg Intravenous Q8H PRN Renee Harder, MD       morphine (PF) 2 MG/ML injection 0.5-1 mg  0.5-1 mg Intravenous Q2H PRN Renee Harder, MD       multivitamin with minerals tablet 1 tablet  1 tablet Oral Daily Renee Harder, MD   1 tablet at 10/04/21 0927   ondansetron (ZOFRAN) tablet 4 mg  4 mg Oral Q6H PRN Renee Harder, MD       Or   ondansetron [facility]) injection 4 mg  4 mg Intravenous Q6H PRN Renee Harder, MD       senna-docusate (Senokot-S) tablet 2 tablet  2 tablet Oral BID Sharen Hones, MD   2 tablet at 10/04/21 2234     Discharge Medications: Please see discharge summary for a list of discharge medications.  Relevant Imaging Results:  Relevant Lab Results:   Additional Information    Pete Pelt, RN

## 2021-10-05 NOTE — NC FL2 (Signed)
[place] LEVEL OF CARE SCREENING TOOL     IDENTIFICATION  Patient Name: Michael Stevens Birthdate: 1940/12/19 Sex: male Admission Date (Current Location): 09/30/2021  [facility] and [place] Number:  Engineering geologist and Address:  [facility], [address], [place], [place] [address]      Provider Number: [id number]  Attending Physician Name and Address:  Sharen Hones, MD  Relative Name and Phone Number:  Zyen, Triggs (Spouse)   [phone] (Mobile)    Current Level of Care: Hospital Recommended Level of Care: [place] Prior Approval Number:    Date Approved/Denied:   PASRR Number: [id number]  Discharge Plan: SNF    Current Diagnoses: Patient Active Problem List   Diagnosis Date Noted   Iron deficiency anemia 10/02/2021   Hip fracture ([place]) 09/30/2021   Closed displaced fracture of right femoral neck ([place])    Chronic cough 09/15/2021   Chronic right-sided low back pain with right-sided sciatica 03/26/2020   Lumbar stenosis with neurogenic claudication 03/26/2020   Parkinson's disease ([place]) 02/12/2020   Muscle stiffness 07/04/2019   B12 deficiency 02/27/2019   Memory loss, short term 02/27/2019   History of oral cancer 02/11/2019   Bilateral impacted cerumen 11/12/2018   Oral cancer ([place]) 09/03/2018   At risk for falling 04/05/2018   Screening for gout 04/05/2018   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   Tremor 11/27/2017   Leg pain, bilateral 11/07/2017   Neuropathy 11/07/2017   Duodenal ulcer with hemorrhage 09/05/2017   Abnormal EKG 01/31/2017   Right bundle branch block 01/31/2017   Chronic thoracic back pain 01/31/2017   BPH associated with nocturia 06/21/2016   Hyperglycemia 12/22/2015   DDD (degenerative disc disease), lumbar 07/14/2015   Internal hemorrhoids 02/11/2015   Male erectile dysfunction, unspecified 02/11/2015   Anemia 01/14/2015   Allergic rhinitis 10/13/2014   Bladder neck  obstruction 10/13/2014   Diverticulosis of intestine 10/13/2014   Gastric catarrh 10/13/2014   Gastroesophageal reflux disease 10/13/2014   Glaucoma 10/13/2014   Hypercholesteremia 10/13/2014   Essential hypertension 10/13/2014   Occult blood in stools 02/06/2014   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   Neoplasm of uncertain behavior of lip, oral cavity, 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    Orientation RESPIRATION BLADDER Height & Weight     Self    External catheter, Incontinent Weight: 64.2 kg Height:  '5\' 5"'$  (165.1 cm)  BEHAVIORAL SYMPTOMS/MOOD NEUROLOGICAL BOWEL NUTRITION STATUS     (Impaired cognitive function)   Diet (Carb modified 1600-2000 cal)  AMBULATORY STATUS COMMUNICATION OF NEEDS Skin   Extensive Assist (+2 transfer) Verbally                         Personal Care Assistance Level of Assistance  Bathing, Feeding, Dressing Bathing Assistance: Limited assistance Feeding assistance: Limited assistance Dressing Assistance: Limited assistance (Max assist for lower body dressing)     Functional Limitations Info  Sight, Hearing, Speech Sight Info: Adequate Hearing Info: Adequate Speech Info: Adequate    SPECIAL CARE FACTORS FREQUENCY  PT (By licensed PT), OT (By licensed OT)     PT Frequency: Min 5x weekly OT Frequency: Min 5x weekly            Contractures Contractures Info: Not present    Additional Factors Info  Code Status, Allergies Code Status Info: DNR Allergies Info: No known allergies           Current Medications (10/05/2021):  This is  the current hospital active medication list Current Facility-Administered Medications  Medication Dose Route Frequency Provider Last Rate Last Admin   acetaminophen (TYLENOL) tablet 325-650 mg  325-650 mg Oral Q6H PRN Renee Harder, MD       aspirin EC tablet 325 mg  325 mg Oral BID Renee Harder, MD   325 mg at 10/05/21 1008   carbidopa-levodopa (SINEMET IR) 25-250 MG per tablet immediate release 1 tablet   1 tablet Oral QID Renee Harder, MD   1 tablet at 10/05/21 1010   feeding supplement (ENSURE ENLIVE / ENSURE PLUS) liquid 237 mL  237 mL Oral BID BM Sharen Hones, MD   237 mL at 10/05/21 1008   ferrous sulfate tablet 325 mg  325 mg Oral Q breakfast Sharen Hones, MD   325 mg at 10/05/21 1008   haloperidol lactate (HALDOL) injection 1 mg  1 mg Intravenous Q6H PRN Sharen Hones, MD       HYDROcodone-acetaminophen (NORCO) 7.5-325 MG per tablet 1-2 tablet  1-2 tablet Oral Q4H PRN Renee Harder, MD   2 tablet at 10/04/21 1648   HYDROcodone-acetaminophen (NORCO/VICODIN) 5-325 MG per tablet 1-2 tablet  1-2 tablet Oral Q4H PRN Renee Harder, MD   1 tablet at 10/04/21 0928   latanoprost (XALATAN) 0.005 % ophthalmic solution 1 drop  1 drop Both Eyes QHS Renee Harder, MD   1 drop at 10/04/21 2236   menthol-cetylpyridinium (CEPACOL) lozenge 3 mg  1 lozenge Oral PRN Renee Harder, MD       Or   phenol (CHLORASEPTIC) mouth spray 1 spray  1 spray Mouth/Throat PRN Renee Harder, MD       metoCLOPramide (REGLAN) tablet 5-10 mg  5-10 mg Oral Q8H PRN Renee Harder, MD       Or   metoCLOPramide (REGLAN) injection 5-10 mg  5-10 mg Intravenous Q8H PRN Renee Harder, MD       morphine (PF) 2 MG/ML injection 0.5-1 mg  0.5-1 mg Intravenous Q2H PRN Renee Harder, MD       multivitamin with minerals tablet 1 tablet  1 tablet Oral Daily Renee Harder, MD   1 tablet at 10/05/21 1007   ondansetron (ZOFRAN) tablet 4 mg  4 mg Oral Q6H PRN Renee Harder, MD       Or   ondansetron [facility]) injection 4 mg  4 mg Intravenous Q6H PRN Renee Harder, MD       senna-docusate (Senokot-S) tablet 2 tablet  2 tablet Oral BID Sharen Hones, MD   2 tablet at 10/05/21 1007     Discharge Medications: Please see discharge summary for a list of discharge medications.  Relevant Imaging Results:  Relevant Lab Results:   Additional Information SSN [place] Nancee Brownrigg, RN

## 2021-10-05 NOTE — NC FL2 (Signed)
[place] LEVEL OF CARE SCREENING TOOL     IDENTIFICATION  Patient Name: Michael Stevens Birthdate: Aug 29, 1940 Sex: male Admission Date (Current Location): 09/30/2021  [facility] and [place] Number:  Engineering geologist and Address:  [facility], [address] [place], [place] [address]      Provider Number: [id number]  Attending Physician Name and Address:  Sharen Hones, MD  Relative Name and Phone Number:  Gabriela, Giannelli (Spouse)   [phone] (Mobile)    Current Level of Care: Hospital Recommended Level of Care: [place] Prior Approval Number:    Date Approved/Denied:   PASRR Number: pending, clinical sent  Discharge Plan: SNF    Current Diagnoses: Patient Active Problem List   Diagnosis Date Noted   Iron deficiency anemia 10/02/2021   Hip fracture ([place]) 09/30/2021   Closed displaced fracture of right femoral neck ([place])    Chronic cough 09/15/2021   Chronic right-sided low back pain with right-sided sciatica 03/26/2020   Lumbar stenosis with neurogenic claudication 03/26/2020   Parkinson's disease ([place]) 02/12/2020   Muscle stiffness 07/04/2019   B12 deficiency 02/27/2019   Memory loss, short term 02/27/2019   History of oral cancer 02/11/2019   Bilateral impacted cerumen 11/12/2018   Oral cancer ([place]) 09/03/2018   At risk for falling 04/05/2018   Screening for gout 04/05/2018   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   Tremor 11/27/2017   Leg pain, bilateral 11/07/2017   Neuropathy 11/07/2017   Duodenal ulcer with hemorrhage 09/05/2017   Abnormal EKG 01/31/2017   Right bundle branch block 01/31/2017   Chronic thoracic back pain 01/31/2017   BPH associated with nocturia 06/21/2016   Hyperglycemia 12/22/2015   DDD (degenerative disc disease), lumbar 07/14/2015   Internal hemorrhoids 02/11/2015   Male erectile dysfunction, unspecified 02/11/2015   Anemia 01/14/2015   Allergic rhinitis 10/13/2014   Bladder  neck obstruction 10/13/2014   Diverticulosis of intestine 10/13/2014   Gastric catarrh 10/13/2014   Gastroesophageal reflux disease 10/13/2014   Glaucoma 10/13/2014   Hypercholesteremia 10/13/2014   Essential hypertension 10/13/2014   Occult blood in stools 02/06/2014   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   Neoplasm of uncertain behavior of lip, oral cavity, 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    Orientation RESPIRATION BLADDER Height & Weight     Self    External catheter, Incontinent Weight: 64.2 kg Height:  '5\' 5"'$  (165.1 cm)  BEHAVIORAL SYMPTOMS/MOOD NEUROLOGICAL BOWEL NUTRITION STATUS     (Impaired cognitive function)   Diet (Carb modified 1600-2000 cal)  AMBULATORY STATUS COMMUNICATION OF NEEDS Skin   Extensive Assist (+2 transfer) Verbally                         Personal Care Assistance Level of Assistance  Bathing, Feeding, Dressing Bathing Assistance: Limited assistance Feeding assistance: Limited assistance Dressing Assistance: Limited assistance (Max assist for lower body dressing)     Functional Limitations Info  Sight, Hearing, Speech Sight Info: Adequate Hearing Info: Adequate Speech Info: Adequate    SPECIAL CARE FACTORS FREQUENCY  PT (By licensed PT), OT (By licensed OT)     PT Frequency: Min 5x weekly OT Frequency: Min 5x weekly            Contractures Contractures Info: Not present    Additional Factors Info  Code Status, Allergies Code Status Info: DNR Allergies Info: No known allergies           Current Medications (10/05/2021):  This is the current hospital active medication list Current Facility-Administered Medications  Medication Dose Route Frequency Provider Last Rate Last Admin   acetaminophen (TYLENOL) tablet 325-650 mg  325-650 mg Oral Q6H PRN Renee Harder, MD       aspirin EC tablet 325 mg  325 mg Oral BID Renee Harder, MD   325 mg at 10/04/21 2234   carbidopa-levodopa (SINEMET IR) 25-250 MG per tablet immediate release 1  tablet  1 tablet Oral QID Renee Harder, MD   1 tablet at 10/04/21 2234   feeding supplement (ENSURE ENLIVE / ENSURE PLUS) liquid 237 mL  237 mL Oral BID BM Sharen Hones, MD   237 mL at 10/04/21 1416   ferrous sulfate tablet 325 mg  325 mg Oral Q breakfast Sharen Hones, MD   325 mg at 10/04/21 7564   haloperidol lactate (HALDOL) injection 1 mg  1 mg Intravenous Q6H PRN Sharen Hones, MD       HYDROcodone-acetaminophen [facility]) 7.5-325 MG per tablet 1-2 tablet  1-2 tablet Oral Q4H PRN Renee Harder, MD   2 tablet at 10/04/21 1648   HYDROcodone-acetaminophen (NORCO/VICODIN) 5-325 MG per tablet 1-2 tablet  1-2 tablet Oral Q4H PRN Renee Harder, MD   1 tablet at 10/04/21 0928   latanoprost (XALATAN) 0.005 % ophthalmic solution 1 drop  1 drop Both Eyes QHS Renee Harder, MD   1 drop at 10/04/21 2236   menthol-cetylpyridinium (CEPACOL) lozenge 3 mg  1 lozenge Oral PRN Renee Harder, MD       Or   phenol (CHLORASEPTIC) mouth spray 1 spray  1 spray Mouth/Throat PRN Renee Harder, MD       metoCLOPramide (REGLAN) tablet 5-10 mg  5-10 mg Oral Q8H PRN Renee Harder, MD       Or   metoCLOPramide (REGLAN) injection 5-10 mg  5-10 mg Intravenous Q8H PRN Renee Harder, MD       morphine (PF) 2 MG/ML injection 0.5-1 mg  0.5-1 mg Intravenous Q2H PRN Renee Harder, MD       multivitamin with minerals tablet 1 tablet  1 tablet Oral Daily Renee Harder, MD   1 tablet at 10/04/21 0927   ondansetron (ZOFRAN) tablet 4 mg  4 mg Oral Q6H PRN Renee Harder, MD       Or   ondansetron [facility]) injection 4 mg  4 mg Intravenous Q6H PRN Renee Harder, MD       senna-docusate (Senokot-S) tablet 2 tablet  2 tablet Oral BID Sharen Hones, MD   2 tablet at 10/04/21 2234     Discharge Medications: Please see discharge summary for a list of discharge medications.  Relevant Imaging Results:  Relevant Lab Results:   Additional Information SSN [place] Isabellarose Kope,  RN
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
## 2021-10-05 NOTE — Progress Notes (Signed)
Discharge Summary and Transfer report was given to [facility], from [facility]. No concerns or questions in regards to discharge summary and report given. Patient is currently awaiting transport at this time and will continue to monitor until transport arrives.

## 2021-10-05 NOTE — Progress Notes (Signed)
Physical Therapy Treatment Patient Details Name: Michael Stevens MRN: [id number] DOB: November 12, 1940 Today's Date: 10/05/2021   History of Present Illness Pt is 81 yo M s/p R hemiarthoplasty 09/30/21. PMH includes dementia, parkinson's disease, anemia, GERD, HTN    PT Comments    Pt received upright in bed agreeable to PT session. Pt unable to recall post hip precautions. Pt re-educated on post hip precautions. Prior to mobility, abd pillow in placed and doffed by PT. Supine therex performed prior to mobility. Pt requiring minA at RLE and torso and HOB elevated to reach EOB with VC's to prevent anterior trunk lean to maintain post hip precautions. Pt able to perform 2x12' bouts of gait with minA on RW for sequencing and mod to max VC's for maintaining RW proximity closer to pt. Pt remains with shuffling gait and unsafe use of RW and hand placement with transfers requiring visual targets to assist in good carryover in following single step commands. Pt returned to supine in bed with minA at RLE to maintain precautions. Abd pillow donned. Pt with all needs in reach. D/c recs remain appropriate.     Recommendations for follow up therapy are one component of a multi-disciplinary discharge planning process, led by the attending physician.  Recommendations may be updated based on patient status, additional functional criteria and insurance authorization.  Follow Up Recommendations  Skilled nursing-short term rehab (<3 hours/day)     Assistance Recommended at Discharge Frequent or constant Supervision/Assistance  Patient can return home with the following A lot of help with walking and/or transfers;A lot of help with bathing/dressing/bathroom;Assistance with cooking/housework;Assistance with feeding;Direct supervision/assist for medications management;Direct supervision/assist for financial management;Assist for transportation;Help with stairs or ramp for entrance   Equipment Recommendations   BSC/3in1;Rolling walker (2 wheels)    Recommendations for Other Services       Precautions / Restrictions Precautions Precautions: Posterior Hip;Fall Precaution Comments: Adductor pillow while seated in recliner or bed Restrictions Weight Bearing Restrictions: Yes RLE Weight Bearing: Weight bearing as tolerated     Mobility  Bed Mobility Overal bed mobility: Needs Assistance Bed Mobility: Supine to Sit, Sit to Supine     Supine to sit: Min assist, HOB elevated Sit to supine: Min assist   General bed mobility comments: minA on RLE for precautions Patient Response: Cooperative  Transfers Overall transfer level: Needs assistance Equipment used: Rolling walker (2 wheels) Transfers: Sit to/from Stand Sit to Stand: Min guard, From elevated surface           General transfer comment: VC's for safe hand placement to stand to RW    Ambulation/Gait Ambulation/Gait assistance: Min assist Gait Distance (Feet): 24 Feet (2x12' bouts) Assistive device: Rolling walker (2 wheels) Gait Pattern/deviations: Step-through pattern, Shuffle, Decreased stride length Gait velocity: decreased     General Gait Details: VC's for maintaining RW closer to pt's BOS throughout.   Stairs             Wheelchair Mobility    Modified Rankin (Stroke Patients Only)       Balance Overall balance assessment: Needs assistance Sitting-balance support: Single extremity supported, Bilateral upper extremity supported, Feet supported Sitting balance-Leahy Scale: Fair     Standing balance support: Bilateral upper extremity supported, During functional activity Standing balance-Leahy Scale: Poor                              Cognition Arousal/Alertness: Awake/alert Behavior During Therapy: WFL for tasks assessed/performed  Overall Cognitive Status: History of cognitive impairments - at baseline                                 General Comments: Unable to repeat  precautions. Requiring multimodal cuing and single step commands for safe mobility throughout session.        Exercises Total Joint Exercises Ankle Circles/Pumps: AROM, Strengthening, Both, 15 reps, Supine Quad Sets: AROM, Strengthening, Right, 10 reps, Supine Gluteal Sets: AROM, Supine, Strengthening, 10 reps, Both Short Arc Quad: AROM, Strengthening, Right, 10 reps, Supine Heel Slides: AAROM, Strengthening, Right, 10 reps, Supine (within safe range with hip flexion precautions) Other Exercises Other Exercises: Re-education on post hip precautions    General Comments        Pertinent Vitals/Pain Pain Assessment Pain Assessment: No/denies pain Pain Intervention(s): Limited activity within patient's tolerance, Monitored during session, Repositioned    Home Living                          Prior Function            PT Goals (current goals can now be found in the care plan section) Acute Rehab PT Goals Patient Stated Goal: none stated PT Goal Formulation: Patient unable to participate in goal setting    Frequency    7X/week      PT Plan Current plan remains appropriate    Co-evaluation              AM-PAC PT "6 Clicks" Mobility   Outcome Measure  Help needed turning from your back to your side while in a flat bed without using bedrails?: A Little Help needed moving from lying on your back to sitting on the side of a flat bed without using bedrails?: A Little Help needed moving to and from a bed to a chair (including a wheelchair)?: A Little Help needed standing up from a chair using your arms (e.g., wheelchair or bedside chair)?: A Little Help needed to walk in hospital room?: A Lot Help needed climbing 3-5 steps with a railing? : Total 6 Click Score: 15    End of Session Equipment Utilized During Treatment: Gait belt Activity Tolerance: Patient tolerated treatment well Patient left: in bed;with call bell/phone within reach;with bed alarm  set;Other (comment) (abduction pillow donned) Nurse Communication: Mobility status PT Visit Diagnosis: Other abnormalities of gait and mobility (R26.89);Unsteadiness on feet (R26.81);Muscle weakness (generalized) (M62.81)     Time: 6195-0932 PT Time Calculation (min) (ACUTE ONLY): 24 min  Charges:  $Gait Training: 8-22 mins $Therapeutic Exercise: 8-22 mins                    Mary [place]ord M. Fairly IV, PT, DPT Physical Therapist- [facility]  10/05/2021, 12:18 PM

## 2021-10-05 NOTE — TOC Progression Note (Signed)
Transition of Care [facility]) - Progression Note    Patient Details  Name: Michael Stevens MRN: [id number] Date of Birth: [birth year]  Transition of Care [facility] CM/SW [place], RN Phone Number: 10/05/2021, 9:50 AM  Clinical Narrative:   Patient's wife is in agreement with [facility] transfer today.  Per Magda Paganini at facility patient can go to room 513 and will transport by [facility]    Expected Discharge Plan: [place] Barriers to Discharge: Continued Medical Work up  Expected Discharge Plan and Services Expected Discharge Plan: [place] arrangements for the past 2 months: Single Family Home Expected Discharge Date: 10/05/21                                     Social Determinants of Health (SDOH) Interventions    Readmission Risk Interventions     No data to display

## 2021-11-18 ENCOUNTER — Inpatient Hospital Stay
Admission: EM | Admit: 2021-11-18 | Discharge: 2021-12-17 | DRG: 871 | Disposition: E | Payer: Medicare Other | Source: Skilled Nursing Facility | Attending: Pulmonary Disease | Admitting: Pulmonary Disease

## 2021-11-18 ENCOUNTER — Emergency Department: Payer: Medicare Other

## 2021-11-18 DIAGNOSIS — I959 Hypotension, unspecified: Secondary | ICD-10-CM | POA: Diagnosis present

## 2021-11-18 DIAGNOSIS — E782 Mixed hyperlipidemia: Secondary | ICD-10-CM | POA: Diagnosis present

## 2021-11-18 DIAGNOSIS — Z515 Encounter for palliative care: Secondary | ICD-10-CM

## 2021-11-18 DIAGNOSIS — E860 Dehydration: Secondary | ICD-10-CM

## 2021-11-18 DIAGNOSIS — Z87891 Personal history of nicotine dependence: Secondary | ICD-10-CM

## 2021-11-18 DIAGNOSIS — Z66 Do not resuscitate: Secondary | ICD-10-CM | POA: Diagnosis present

## 2021-11-18 DIAGNOSIS — N170 Acute kidney failure with tubular necrosis: Secondary | ICD-10-CM | POA: Diagnosis present

## 2021-11-18 DIAGNOSIS — I10 Essential (primary) hypertension: Secondary | ICD-10-CM | POA: Diagnosis present

## 2021-11-18 DIAGNOSIS — R680 Hypothermia, not associated with low environmental temperature: Secondary | ICD-10-CM | POA: Diagnosis present

## 2021-11-18 DIAGNOSIS — Z20822 Contact with and (suspected) exposure to covid-19: Secondary | ICD-10-CM | POA: Diagnosis present

## 2021-11-18 DIAGNOSIS — N390 Urinary tract infection, site not specified: Secondary | ICD-10-CM

## 2021-11-18 DIAGNOSIS — R6521 Severe sepsis with septic shock: Secondary | ICD-10-CM | POA: Diagnosis present

## 2021-11-18 DIAGNOSIS — F0280 Dementia in other diseases classified elsewhere without behavioral disturbance: Secondary | ICD-10-CM | POA: Diagnosis present

## 2021-11-18 DIAGNOSIS — J9601 Acute respiratory failure with hypoxia: Secondary | ICD-10-CM | POA: Diagnosis present

## 2021-11-18 DIAGNOSIS — R4020 Unspecified coma: Secondary | ICD-10-CM | POA: Diagnosis present

## 2021-11-18 DIAGNOSIS — M6282 Rhabdomyolysis: Secondary | ICD-10-CM | POA: Diagnosis present

## 2021-11-18 DIAGNOSIS — E2740 Unspecified adrenocortical insufficiency: Secondary | ICD-10-CM | POA: Diagnosis present

## 2021-11-18 DIAGNOSIS — D649 Anemia, unspecified: Secondary | ICD-10-CM

## 2021-11-18 DIAGNOSIS — A419 Sepsis, unspecified organism: Principal | ICD-10-CM

## 2021-11-18 DIAGNOSIS — Z79899 Other long term (current) drug therapy: Secondary | ICD-10-CM

## 2021-11-18 DIAGNOSIS — D509 Iron deficiency anemia, unspecified: Secondary | ICD-10-CM | POA: Diagnosis present

## 2021-11-18 DIAGNOSIS — E870 Hyperosmolality and hypernatremia: Secondary | ICD-10-CM

## 2021-11-18 LAB — URINALYSIS, COMPLETE (UACMP) WITH MICROSCOPIC
Bilirubin Urine: NEGATIVE
Glucose, UA: NEGATIVE mg/dL
Ketones, ur: NEGATIVE mg/dL
Nitrite: NEGATIVE
Protein, ur: 100 mg/dL — AB
Specific Gravity, Urine: 1.008 (ref 1.005–1.030)
Squamous Epithelial / HPF: NONE SEEN (ref 0–5)
WBC, UA: >50 WBC/hpf — ABNORMAL HIGH (ref 0–5)
pH: 5 (ref 5.0–8.0)

## 2021-11-18 LAB — COMPREHENSIVE METABOLIC PANEL
ALT: 6 U/L (ref 0–44)
AST: 55 U/L — ABNORMAL HIGH (ref 15–41)
Albumin: 2.2 g/dL — ABNORMAL LOW (ref 3.5–5.0)
Alkaline Phosphatase: 107 U/L (ref 38–126)
Anion gap: 7 (ref 5–15)
BUN: 86 mg/dL — ABNORMAL HIGH (ref 8–23)
CO2: 20 mmol/L — ABNORMAL LOW (ref 22–32)
Calcium: 8.6 mg/dL — ABNORMAL LOW (ref 8.9–10.3)
Chloride: 129 mmol/L — ABNORMAL HIGH (ref 98–111)
Creatinine, Ser: 2.69 mg/dL — ABNORMAL HIGH (ref 0.61–1.24)
GFR, Estimated: 23 mL/min — ABNORMAL LOW (ref >60)
Glucose, Bld: 126 mg/dL — ABNORMAL HIGH (ref 70–99)
Potassium: 4.5 mmol/L (ref 3.5–5.1)
Sodium: 156 mmol/L — ABNORMAL HIGH (ref 135–145)
Total Bilirubin: 0.7 mg/dL (ref 0.3–1.2)
Total Protein: 6.2 g/dL — ABNORMAL LOW (ref 6.5–8.1)

## 2021-11-18 LAB — CBC WITH DIFFERENTIAL/PLATELET
Abs Immature Granulocytes: 0.09 10*3/uL — ABNORMAL HIGH (ref 0.00–0.07)
Basophils Absolute: 0.1 10*3/uL (ref 0.0–0.1)
Basophils Relative: 0 %
Eosinophils Absolute: 0.1 10*3/uL (ref 0.0–0.5)
Eosinophils Relative: 1 %
HCT: 22.2 % — ABNORMAL LOW (ref 39.0–52.0)
Hemoglobin: 6.7 g/dL — ABNORMAL LOW (ref 13.0–17.0)
Immature Granulocytes: 1 %
Lymphocytes Relative: 6 %
Lymphs Abs: 0.9 10*3/uL (ref 0.7–4.0)
MCH: 28.4 pg (ref 26.0–34.0)
MCHC: 30.2 g/dL (ref 30.0–36.0)
MCV: 94.1 fL (ref 80.0–100.0)
Monocytes Absolute: 1 10*3/uL (ref 0.1–1.0)
Monocytes Relative: 7 %
Neutro Abs: 12.9 10*3/uL — ABNORMAL HIGH (ref 1.7–7.7)
Neutrophils Relative %: 85 %
Platelets: 177 10*3/uL (ref 150–400)
RBC: 2.36 MIL/uL — ABNORMAL LOW (ref 4.22–5.81)
RDW: 21.2 % — ABNORMAL HIGH (ref 11.5–15.5)
WBC: 15.1 10*3/uL — ABNORMAL HIGH (ref 4.0–10.5)
nRBC: 0.1 % (ref 0.0–0.2)

## 2021-11-18 LAB — APTT: aPTT: 52 seconds — ABNORMAL HIGH (ref 24–36)

## 2021-11-18 LAB — PROTIME-INR
INR: 1.4 — ABNORMAL HIGH (ref 0.8–1.2)
Prothrombin Time: 17.1 seconds — ABNORMAL HIGH (ref 11.4–15.2)

## 2021-11-18 LAB — LACTIC ACID, PLASMA: Lactic Acid, Venous: 2.4 mmol/L (ref 0.5–1.9)

## 2021-11-18 LAB — CK: Total CK: 896 U/L — ABNORMAL HIGH (ref 49–397)

## 2021-11-18 MED ORDER — VANCOMYCIN HCL 1500 MG/300ML IV SOLN
1500.0000 mg | Freq: Once | INTRAVENOUS | Status: AC
Start: 2021-11-18 — End: 2021-11-19
  Administered 2021-11-19: 1500 mg via INTRAVENOUS
  Filled 2021-11-18: qty 300

## 2021-11-18 MED ORDER — HYDROCORTISONE SOD SUC (PF) 100 MG IJ SOLR
100.0000 mg | Freq: Two times a day (BID) | INTRAMUSCULAR | Status: DC
  Administered 2021-11-19: 100 mg via INTRAVENOUS
  Filled 2021-11-18 (×2): qty 2

## 2021-11-18 MED ORDER — METRONIDAZOLE 500 MG/100ML IV SOLN
500.0000 mg | Freq: Once | INTRAVENOUS | Status: AC
  Administered 2021-11-18: 500 mg via INTRAVENOUS
  Filled 2021-11-18: qty 100

## 2021-11-18 MED ORDER — LACTATED RINGERS IV BOLUS (SEPSIS)
1000.0000 mL | Freq: Once | INTRAVENOUS | Status: AC
  Administered 2021-11-18: 1000 mL via INTRAVENOUS

## 2021-11-18 MED ORDER — SODIUM CHLORIDE 0.9 % IV SOLN
2.0000 g | Freq: Once | INTRAVENOUS | Status: AC
  Administered 2021-11-18: 2 g via INTRAVENOUS
  Filled 2021-11-18: qty 12.5

## 2021-11-18 MED ORDER — SODIUM CHLORIDE 0.9 % IV SOLN: 10.0000 mL/h | Freq: Once | INTRAVENOUS | Status: DC

## 2021-11-18 MED ORDER — NALOXONE HCL 2 MG/2ML IJ SOSY
PREFILLED_SYRINGE | INTRAMUSCULAR | Status: AC
  Administered 2021-11-18: 2 mg
  Filled 2021-11-18: qty 2

## 2021-11-18 MED ORDER — VANCOMYCIN HCL IN DEXTROSE 1-5 GM/200ML-% IV SOLN: 1000.0000 mg | Freq: Once | INTRAVENOUS | Status: DC

## 2021-11-18 MED ORDER — LACTATED RINGERS IV SOLN: INTRAVENOUS | Status: DC

## 2021-11-18 MED ORDER — NOREPINEPHRINE 4 MG/250ML-% IV SOLN
0.0000 ug/min | INTRAVENOUS | Status: DC
  Administered 2021-11-18: 2 ug/min via INTRAVENOUS
  Filled 2021-11-18: qty 250

## 2021-11-18 NOTE — ED Notes (Signed)
MD Paduchowski aware of vital signs at this time. Pt has been on a bair hugger at 43c degrees for approximately the last 40 mins.

## 2021-11-18 NOTE — ED Notes (Signed)
This RN now assuming care of pt. Report received.

## 2021-11-18 NOTE — Progress Notes (Signed)
PHARMACY -  BRIEF ANTIBIOTIC NOTE   Pharmacy has received consult(s) for Vancomycin, Cefepime from an ED provider.  The patient's profile has been reviewed for ht/wt/allergies/indication/available labs.    One time order(s) placed for Vancomycin 1500 mg IV X 1 and Cefepime 2 gm IV X 1  Further antibiotics/pharmacy consults should be ordered by admitting physician if indicated.                       Thank you, Amina Menchaca D 12/03/2021  10:43 PM

## 2021-11-18 NOTE — Progress Notes (Signed)
CODE SEPSIS - PHARMACY COMMUNICATION  **Broad Spectrum Antibiotics should be administered within 1 hour of Sepsis diagnosis**  Time Code Sepsis Called/Page Received: 8/3 @ 2236   Antibiotics Ordered: Cefepime, metronidazole, Vanc  Time of 1st antibiotic administration: Cefepime 2 gm IV X 1 on 8/3 @ 2245   Additional action taken by pharmacy:   If necessary, Name of Provider/Nurse Contacted:     Bristol Osentoski D ,PharmD Clinical Pharmacist  12/01/2021  10:50 PM

## 2021-11-18 NOTE — ED Provider Notes (Signed)
[facility] Provider Note    Event Date/Time   First MD Initiated Contact with Patient 12/11/2021 2226     (approximate)  History   Chief Complaint: Loss of Consciousness (Per EMS-Patient has been unresponsive for 36 hours. Per facility, they called EMS at 0600, but family requested that he not be transported. Staff decided to call tonight. Patient remains unresponsive)  HPI  Michael Stevens is a 81 y.o. male with a past medical history of anemia, arthritis, dementia, hypertension, hyperlipidemia, Parkinson's, presents to the emergency department from his nursing facility for decreased responsiveness.  According to report from [place] facility patient was noted to have decreased responsiveness and increased somnolence earlier this morning at that time family refused transport to the hospital.  They state this evening patient continued to be altered and quite somnolent compared to his baseline and family agreed to transport to the ED.  Here upon arrival patient is unresponsive to voice but will moan or move slightly to significant sternal rub.  Patient satting 80% placed on a nonrebreather mask.  Patient found to be hypothermic at 92 degrees rectally we will place on a Bair hugger.  Physical Exam   Triage Vital Signs: ED Triage Vitals  Enc Vitals Group     BP 11/30/2021 2233 (!) 54/42     Pulse Rate 12/09/2021 2230 63     Resp 12/07/2021 2230 (!) 22     Temp 11/24/2021 2230 (!) 92.6 F (33.7 C)     Temp Source 12/05/2021 2230 Rectal     SpO2 11/26/2021 2230 (!) 87 %     Weight 12/08/2021 2232 140 lb 6.9 oz (63.7 kg)     Height --      Head Circumference --      Peak Flow --      Pain Score 11/30/2021 2232 0     Pain Loc --      Pain Edu? --      Excl. in [place]? --     Most recent vital signs: Vitals:   12/04/2021 2230 11/30/2021 2233  BP:  (!) 54/42  Pulse: 63   Resp: (!) 22   Temp: (!) 92.6 F (33.7 C)   SpO2: (!) 87%     General: Unresponsive to voice,  will slightly [place] slightly moved to significant sternal rub.  Corneal reflexes intact with pinpoint pupils. CV:  Good peripheral perfusion.  Regular rate and rhythm  Resp:  Mild tachypnea.  Mild rhonchi bilaterally. Abd:  No distention.  Soft, nontender.  No rebound or guarding.  No reaction to abdominal palpation.  Patient does have a sacral ulceration.    ED Results / Procedures / Treatments   EKG  EKG viewed and interpreted by myself shows a normal sinus rhythm at 58 bpm with a narrow QRS, normal axis, normal intervals besides slight QTc prolongation nonspecific but no concerning ST changes.  RADIOLOGY  I viewed and interpreted the x-ray image.  Patient has significant cardiomegaly no urodynamically nonbenzodiazepines. Radiologist read the x-ray is negative.   MEDICATIONS ORDERED IN ED: Medications  naloxone (NARCAN) 2 MG/2ML injection (has no administration in time range)  lactated ringers infusion (has no administration in time range)  lactated ringers bolus 1,000 mL (has no administration in time range)    And  lactated ringers bolus 1,000 mL (has no administration in time range)  ceFEPIme (MAXIPIME) 2 g in sodium chloride 0.9 % 100 mL IVPB (has no administration in time range)  metroNIDAZOLE (FLAGYL) IVPB 500 mg (has no administration in time range)  vancomycin (VANCOCIN) IVPB 1000 mg/200 mL premix (has no administration in time range)     IMPRESSION / MDM / ASSESSMENT AND PLAN / ED COURSE  I reviewed the triage vital signs and the nursing notes.  Patient's presentation is most consistent with acute presentation with potential threat to life or bodily function.  Patient presents emergency department for unresponsiveness.  Patient sent from his nursing facility for decreased responsiveness since this morning now largely unresponsive besides to significant stimuli such as tar.  Patient has a do not left the state order/paper with him.  Patient did have pinpoint pupils  patient given 2 mg of Narcan as his MAR does show hydrocodone although no response to the Narcan.  Patient found to be satting around 80% on room air placed on nonrebreather mask currently satting around 90% on a nonrebreather.  Patient found to be hypothermic 92.6 and hypotensive 54/42 concerning for septic shock.  Patient does have mild rhonchi bilaterally possibly indicating aspiration or pneumonia.  We will also check for COVID we will check CBC chemistry we will obtain a chest x-ray we will check blood cultures we will start on broad-spectrum antibiotics we will check a urinalysis and send a urine culture.  We will continue to closely monitor while awaiting further results.  Attempted to call the patient's daughter who is listed as his emergency contact with no answer as of yet.  Attempted to call the daughter once again as well as the spouse, no answer from either phone number listed.  Patient blood pressure remains hypotensive despite IV fluids currently 57/38.  Still unable to reach family we will start the patient on peripheral pressors.  I spoke to ICU will be admitting to their service.    Labs have resulted showing renal insufficiency and dehydration.  Patient receiving IV fluids.  Patient is hypernatremic likely again indicating dehydration.  Patient's urinalysis has resulted consistent with a significant urinary tract infection receiving IV antibiotics.  Patient CK is elevated receiving IV fluids.  CBC shows hemoglobin of 6.7, recent hemoglobin of 8.1 slightly diminished patient receiving 1 unit of packed red blood cells.  Patient mated to the intensive care unit.  CRITICAL CARE Performed by: Harvest Dark   Total critical care time: 45 minutes  Critical care time was exclusive of separately billable procedures and treating other patients.  Critical care was necessary to treat or prevent imminent or life-threatening deterioration.  Critical care was time spent personally by me on  the following activities: development of treatment plan with patient and/or surrogate as well as nursing, discussions with consultants, evaluation of patient's response to treatment, examination of patient, obtaining history from patient or surrogate, ordering and performing treatments and interventions, ordering and review of laboratory studies, ordering and review of radiographic studies, pulse oximetry and re-evaluation of patient's condition.     FINAL CLINICAL IMPRESSION(S) / ED DIAGNOSES   Unresponsiveness Sepsis Urinary tract infection Anemia Rhabdomyolysis Dehydration  Note:  This document was prepared using Dragon voice recognition software and may include unintentional dictation errors.   Harvest Dark, MD 11/16/2021 2330

## 2021-11-18 NOTE — Sepsis Progress Note (Signed)
Elink following Code Sepsis. 

## 2021-11-19 DIAGNOSIS — R6521 Severe sepsis with septic shock: Secondary | ICD-10-CM | POA: Diagnosis present

## 2021-11-19 DIAGNOSIS — E860 Dehydration: Secondary | ICD-10-CM | POA: Diagnosis present

## 2021-11-19 DIAGNOSIS — E2740 Unspecified adrenocortical insufficiency: Secondary | ICD-10-CM | POA: Diagnosis present

## 2021-11-19 DIAGNOSIS — E782 Mixed hyperlipidemia: Secondary | ICD-10-CM | POA: Diagnosis present

## 2021-11-19 DIAGNOSIS — A419 Sepsis, unspecified organism: Secondary | ICD-10-CM | POA: Diagnosis present

## 2021-11-19 DIAGNOSIS — Z20822 Contact with and (suspected) exposure to covid-19: Secondary | ICD-10-CM | POA: Diagnosis present

## 2021-11-19 DIAGNOSIS — Z87891 Personal history of nicotine dependence: Secondary | ICD-10-CM | POA: Diagnosis not present

## 2021-11-19 DIAGNOSIS — Z515 Encounter for palliative care: Secondary | ICD-10-CM | POA: Diagnosis not present

## 2021-11-19 DIAGNOSIS — D509 Iron deficiency anemia, unspecified: Secondary | ICD-10-CM | POA: Diagnosis present

## 2021-11-19 DIAGNOSIS — F0280 Dementia in other diseases classified elsewhere without behavioral disturbance: Secondary | ICD-10-CM | POA: Diagnosis present

## 2021-11-19 DIAGNOSIS — I959 Hypotension, unspecified: Secondary | ICD-10-CM | POA: Diagnosis present

## 2021-11-19 DIAGNOSIS — R4020 Unspecified coma: Secondary | ICD-10-CM | POA: Diagnosis present

## 2021-11-19 DIAGNOSIS — Z66 Do not resuscitate: Secondary | ICD-10-CM | POA: Diagnosis present

## 2021-11-19 DIAGNOSIS — N170 Acute kidney failure with tubular necrosis: Secondary | ICD-10-CM | POA: Diagnosis present

## 2021-11-19 DIAGNOSIS — M6282 Rhabdomyolysis: Secondary | ICD-10-CM | POA: Diagnosis present

## 2021-11-19 DIAGNOSIS — J9601 Acute respiratory failure with hypoxia: Secondary | ICD-10-CM | POA: Diagnosis present

## 2021-11-19 DIAGNOSIS — E870 Hyperosmolality and hypernatremia: Secondary | ICD-10-CM | POA: Diagnosis present

## 2021-11-19 DIAGNOSIS — R680 Hypothermia, not associated with low environmental temperature: Secondary | ICD-10-CM | POA: Diagnosis present

## 2021-11-19 DIAGNOSIS — I10 Essential (primary) hypertension: Secondary | ICD-10-CM | POA: Diagnosis present

## 2021-11-19 DIAGNOSIS — Z79899 Other long term (current) drug therapy: Secondary | ICD-10-CM | POA: Diagnosis not present

## 2021-11-19 LAB — BPAM RBC
Blood Product Expiration Date: 202308192359
Unit Type and Rh: 9500

## 2021-11-19 LAB — TYPE AND SCREEN
ABO/RH(D): O NEG
Antibody Screen: NEGATIVE
Unit division: 0

## 2021-11-19 LAB — PREPARE RBC (CROSSMATCH)

## 2021-11-19 LAB — STREP PNEUMONIAE URINARY ANTIGEN: Strep Pneumo Urinary Antigen: NEGATIVE

## 2021-11-19 LAB — GLUCOSE, CAPILLARY: Glucose-Capillary: 118 mg/dL — ABNORMAL HIGH (ref 70–99)

## 2021-11-19 LAB — PROCALCITONIN: Procalcitonin: 0.64 ng/mL

## 2021-11-19 LAB — T4, FREE: Free T4: 0.59 ng/dL — ABNORMAL LOW (ref 0.61–1.12)

## 2021-11-19 LAB — LACTIC ACID, PLASMA: Lactic Acid, Venous: 2.8 mmol/L (ref 0.5–1.9)

## 2021-11-19 LAB — TSH: TSH: 15.723 u[IU]/mL — ABNORMAL HIGH (ref 0.350–4.500)

## 2021-11-19 MED ORDER — SODIUM CHLORIDE 0.9 % IV SOLN: INTRAVENOUS | Status: DC

## 2021-11-19 MED ORDER — ACETAMINOPHEN 325 MG PO TABS: 650.0000 mg | ORAL_TABLET | Freq: Four times a day (QID) | ORAL | Status: DC | PRN

## 2021-11-19 MED ORDER — GLYCOPYRROLATE 1 MG PO TABS: 1.0000 mg | ORAL_TABLET | ORAL | Status: DC | PRN

## 2021-11-19 MED ORDER — GLYCOPYRROLATE 0.2 MG/ML IJ SOLN: 0.2000 mg | INTRAMUSCULAR | Status: DC | PRN

## 2021-11-19 MED ORDER — POLYVINYL ALCOHOL 1.4 % OP SOLN: 1.0000 [drp] | Freq: Four times a day (QID) | OPHTHALMIC | Status: DC | PRN

## 2021-11-19 MED ORDER — DOCUSATE SODIUM 100 MG PO CAPS: 100.0000 mg | ORAL_CAPSULE | Freq: Two times a day (BID) | ORAL | Status: DC | PRN

## 2021-11-19 MED ORDER — GLYCOPYRROLATE 0.2 MG/ML IJ SOLN
0.2000 mg | INTRAMUSCULAR | Status: DC | PRN
  Administered 2021-11-19: 0.2 mg via INTRAVENOUS
  Filled 2021-11-19: qty 1

## 2021-11-19 MED ORDER — POLYETHYLENE GLYCOL 3350 17 G PO PACK: 17.0000 g | PACK | Freq: Every day | ORAL | Status: DC | PRN

## 2021-11-19 MED ORDER — LORAZEPAM 2 MG/ML IJ SOLN
2.0000 mg | INTRAMUSCULAR | Status: DC | PRN
  Administered 2021-11-19: 2 mg via INTRAVENOUS
  Filled 2021-11-19: qty 1

## 2021-11-19 MED ORDER — MORPHINE SULFATE (PF) 2 MG/ML IV SOLN
2.0000 mg | INTRAVENOUS | Status: DC | PRN
  Administered 2021-11-19: 2 mg via INTRAVENOUS
  Filled 2021-11-19: qty 1

## 2021-11-19 MED ORDER — ACETAMINOPHEN 650 MG RE SUPP: 650.0000 mg | Freq: Four times a day (QID) | RECTAL | Status: DC | PRN

## 2021-11-21 LAB — URINE CULTURE: Culture: >=100000 — AB

## 2021-11-22 LAB — LEGIONELLA PNEUMOPHILA SEROGP 1 UR AG: L. pneumophila Serogp 1 Ur Ag: NEGATIVE

## 2021-11-23 LAB — CULTURE, BLOOD (ROUTINE X 2)
Culture: NO GROWTH
Culture: NO GROWTH
Special Requests: ADEQUATE
Special Requests: ADEQUATE

## 2021-12-17 NOTE — Progress Notes (Signed)
AC notified patient is prepared for morgue.

## 2021-12-17 NOTE — IPAL (Signed)
  Interdisciplinary Goals of Care Family Meeting   Date carried out: December 06, 2021  Location of the meeting: Conference room  Interdisciplinary Goals of Care Family Meeting    Member's involved: NP and Family Member or next of kin, Daughter Butch Penny, Wife Elyan Vanwieren and Brother in [place].   Durable Power of Attorney or acting medical decision maker: Wife Olando Willems   Discussion:  Advance Care Planning/Goals of Care discussion was performed during the course of treatment to decide on type of care right for this patient following admission to the ICU.   I met with patient's wife Jeannene Patella, daughter Butch Penny and brother in law to discuss goals of care in details due to patient's rapidly declining clinical status.I reviewed patient's worsening mental status and inability to protect his airway due to severe sepsis with shock,  vital signs including unstable blood pressure requiring high dose pressors  and overall poor prognosis with the family at the bedside and answered all their question.   Discussed prognosis, expected outcome with or without ongoing aggressive treatments and the options for de-escalation of care.   Diagnosis(es): Acute hypoxic respiratory failure secondary to severe sepsis with shock and multiorgan failure, Severe symptomatic Anemia, AKI, Acute Rhabdo and Adrenal Insufficiency Prognosis: Poor Code Status: DNR Disposition: ICU Next Steps:  Family understands the situation. They have consented and agreed to DNR/DNI and would not wish to pursue any aggressive treatment.  Patient's family would like to proceed with  transitioning to full comfort care.   Family are satisfied with Plan of action and management. All questions answered     Total Time Spent Face to Face addressing advance care planning in the presence of the Patient: 35 minutes     Rufina Falco, DNP, CCRN, FNP-C, AGACNP-BC Acute Care & Family Nurse Practitioner  [place] for personal  pager PCCM on call pager [phone] until 7 am

## 2021-12-17 NOTE — Progress Notes (Signed)
Family at bedside. Decision made to make patient comfort care. Wife is coming to beside from her hospital room. Awaiting her arrival.

## 2021-12-17 NOTE — Progress Notes (Signed)
   2021-11-22 0055  Clinical Encounter Type  Visited With Patient and family together  Visit Type Patient actively dying  Referral From Nurse  Consult/Referral To [place] [facility];Grief support   Chaplain responded to page to provide EOL support to wife, brother and daughter of patient who is being placed on Comfort care.

## 2021-12-17 NOTE — Death Summary Note (Signed)
DEATH SUMMARY   Patient Details  Name: Michael Stevens MRN: [id number] DOB: [birth year]  Admission/Discharge Information   Admit Date:  Dec 01, 2021  Date of Death: Date of Death: 02-Dec-2021  Time of Death: Time of Death: 0311  Length of Stay: 0  Referring Physician: Bo Merino, FNP   Reason(s) for Hospitalization  Altered mental status  Diagnoses  Preliminary cause of death: Severe sepsis with septic shock and multiorgan failure Secondary Diagnoses (including complications and co-morbidities):  Principal Problem:   Sepsis with hypotension [facility])   Brief Hospital Course (including significant findings, care, treatment, and services provided and events leading to death)  Michael Stevens is a 81 y.o. year old male who  presented to the ED from [facility] for unresponsiveness.   Patient was recently discharged to [place] following right hip fracture s/p repair on 09/30/21. Per ED reports and EMS runsheet, staff at [facility] called EMS due to pt being unresponsive at 0600 , but family adamantly refused that  patient should not be transported to the ED. He apparently had been altered for >36 hours per staff who again called EMS this evening due to worsening mental status. On EMS arri   ED Course: In the emergency department, the temperature was 33.7C, the heart rate 63 beats/minute, the blood pressure  54/20m Hg, the respiratory rate 22 breaths/minute, and the oxygen saturation 87% on NRB. He was unresponsive with agonal breathing.    Pertinent Labs/Diagnostics Findings: Chemistry:Na+/ K+: 156/4.5 Glucose: 126 BUN/Cr.: 86/2.69 Calcium: 8.6  AST/ALT:55/6 CBC: WBC: 15.1 Hgb/Hct: 6.7  Other Lab findings:   PCT:0.64  Lactic acid: 2.4,  COVID PCR: Negative, CK:896. UA+ Leukocytes,wbc, bacteria Imaging:  CXR> Mild left basilar atelectasis   Patient given 30 cc/kg of fluids and started on broad-spectrum antibiotics Vanco cefepime and Flagyl for sepsis with septic  shock. Patient remained hypotensive despite IVF boluses therefore was started on Levophed. PCCM consulted.  ICU Course: Patient admitted to the ICU for further management of Acute Hypoxic Respiratory Failure secondary to  severe sepsis with shock, Sepsis with septic shock due to suspected UTI &Sacral Wound Infection, AKI on CKD, likely ATN in the setting of above, Hypernatremia, Acute Rhabdomyolysis, Non Anion Gap Metabolic Acidosis, Acute on Chronic Symptomatic Anemia, Acute Adrenal Insufficiency likely in the setting of severe sepsis. Patient at high risk for decompensation requiring mechanical ventilation however he was DNR/DNI per form from facility and patient's family who were at the bedside. Advance Care Planning/Goals of Care was discussed with patient's wife PJeannene Patella daughter DButch Pennyand brother in law due to patient's rapidly declining clinical status. Discussed worsening mental status and inability to protect his airway due to severe sepsis with shock and multiorgan failure, unstable blood pressure requiring high dose pressors  and overall poor prognosis with the family at the bedside and answered all their question. Family consented and agreed to transition patient  to full comfort care. Patient passed away shortly at 0311 with family at bedside.   Pertinent Labs and Studies  Significant Diagnostic Studies DG Chest Port 1 View  Result Date: 8August 16, 2023CLINICAL DATA:  Possible sepsis EXAM: PORTABLE CHEST 1 VIEW COMPARISON:  None Available. FINDINGS: The heart size and mediastinal contours are within normal limits. Both lungs well aerated. Mild left basilar atelectasis is noted. The visualized skeletal structures are unremarkable. IMPRESSION: Mild left basilar atelectasis. Electronically Signed   By: MInez CatalinaM.D.   On: 008/16/202323:03    Microbiology No results found for  this or any previous visit (from the past 240 hour(s)).  Lab Basic Metabolic Panel: Recent Labs  Lab 11/23/2021 2237  NA  156*  K 4.5  CL 129*  CO2 20*  GLUCOSE 126*  BUN 86*  CREATININE 2.69*  CALCIUM 8.6*   Liver Function Tests: Recent Labs  Lab 11/29/2021 2237  AST 55*  ALT 6  ALKPHOS 107  BILITOT 0.7  PROT 6.2*  ALBUMIN 2.2*   No results for input(s): "LIPASE", "AMYLASE" in the last 168 hours. No results for input(s): "AMMONIA" in the last 168 hours. CBC: Recent Labs  Lab 12/03/2021 2237  WBC 15.1*  NEUTROABS 12.9*  HGB 6.7*  HCT 22.2*  MCV 94.1  PLT 177   Cardiac Enzymes: Recent Labs  Lab 11/26/2021 2237  CKTOTAL 896*   Sepsis Labs: Recent Labs  Lab 12/14/2021 2237 11/28/21 0108  PROCALCITON  --  0.64  WBC 15.1*  --   LATICACIDVEN 2.4* 2.8*    Procedures/Operations   8/4: Right Femoral [address] A Jaxn Chiquito DNP Nov 28, 2021, 4:13 AM

## 2021-12-17 NOTE — H&P (Signed)
NAME:  Michael Stevens, MRN:  [id number], DOB:  04-30-1940, LOS: 0 ADMISSION DATE:  11/17/2021, CONSULTATION DATE:  11-21-2021 REFERRING MD: Harvest Dark, MD CHIEF COMPLAINT:  Altered Mental Status    HPI  81 y.o  male with significant PMH of Advanced PD, Dementia, Iron deficiency anemia, HTN, HLD, and recent right hip fracture s/p repair  09/30/21  who presented to the ED from [facility] for unresponsiveness.  Patient was recently discharged to [facility] following right hip fracture s/p repair on 09/30/21. Per ED reports and EMS runsheet, staff at [facility] called EMS due to pt being unresponsive at 0600 , but family adamantly refused that  patient should not be transported to the ED. He apparently had been altered for >36 hours per staff who again called EMS this evening due to worsening mental status. On EMS arri  ED Course: In the emergency department, the temperature was 33.7C, the heart rate 63 beats/minute, the blood pressure  54/43m Hg, the respiratory rate 22 breaths/minute, and the oxygen saturation 87% on NRB. He was unresponsive with agonal breathing.   Pertinent Labs/Diagnostics Findings: Chemistry:Na+/ K+: 156/4.5 Glucose: 126 BUN/Cr.: 86/2.69 Calcium: 8.6  AST/ALT:55/6 CBC: WBC: 15.1 Hgb/Hct: 6.7  Other Lab findings:   PCT:0.64  Lactic acid: 2.4,  COVID PCR: Negative, CK:896. UA+ Leukocytes,wbc, bacteria Imaging:  CXR> Mild left basilar atelectasis  Patient given 30 cc/kg of fluids and started on broad-spectrum antibiotics Vanco cefepime and Flagyl for sepsis with septic shock. Patient remained hypotensive despite IVF boluses therefore was started on Levophed. PCCM consulted.  Past Medical History  Advanced PD, Dementia, Iron deficiency anemia, HTN, HLD, and recent right hip fracture s/p repair  09/30/21  Significant Hospital Events   8/4:Admitted to ICU with AMS 2/2 severe sepsis w/shock, Anemia , AKI  Consults:  None  Procedures:  8/4: Right  Femoral Central Line  Significant Diagnostic Tests:  8/4: Chest Xray>Mild left basilar atelectasis  Micro Data:  8/3: SARS-CoV-2 PCR>  8/3: Blood culture x2> 8/3: Urine Culture> 8/3: MRSA PCR>>  8/4: Strep pneumo urinary antigen> 8/4: Legionella urinary antigen>  Antimicrobials:  Vancomycin 8/3 x 1 in the ED Cefepime 8/3 x 1 in the ED Metronidazole 8/3 x 1 in the ED  OBJECTIVE  Blood pressure (!) 90/34, pulse 75, temperature (!) 92.8 F (33.8 C), resp. rate 18, weight 63.7 kg, SpO2 (!) 84 %.      Intake/Output Summary (Last 24 hours) at 8August 06, 20230038 Last data filed at 82023/08/060018 Gross per 24 hour  Intake 1200 ml  Output --  Net 1200 ml   Filed Weights   11/17/2021 2232  Weight: 63.7 kg    Physical Examination  GENERAL: 81year-old male critically ill patient lying in the bed in mild distress on NRB EYES: Pupils equal, round, reactive to light and accommodation. No scleral icterus. Extraocular muscles intact.  HEENT: Head atraumatic, normocephalic. Oropharynx and nasopharynx clear.  NECK:  Supple, no jugular venous distention. No thyroid enlargement, no tenderness.  LUNGS: Decreased breath sounds bilaterally, no wheezing, rales,rhonchi or crepitation. mild use of accessory muscles of respiration.  CARDIOVASCULAR: S1, S2 normal. No murmurs, rubs, or gallops.  ABDOMEN: Soft, nontender, nondistended. Bowel sounds present. No organomegaly or mass.  EXTREMITIES: No pedal edema, cyanosis, or clubbing.  NEUROLOGIC: Cranial nerves II through XII are intact.  unable to assess muscle strength. Sensation intact. Gait not checked.  PSYCHIATRIC: The patient is unresponsive  SKIN: No obvious rash, lesion. Stage 4  sacral ulcer   Labs/imaging that I havepersonally reviewed  (right click and "Reselect all SmartList Selections" daily)     Labs   CBC: Recent Labs  Lab 12/02/2021 2237  WBC 15.1*  NEUTROABS 12.9*  HGB 6.7*  HCT 22.2*  MCV 94.1  PLT 425    Basic Metabolic  Panel: Recent Labs  Lab 11/23/2021 2237  NA 156*  K 4.5  CL 129*  CO2 20*  GLUCOSE 126*  BUN 86*  CREATININE 2.69*  CALCIUM 8.6*   GFR: Estimated Creatinine Clearance: 19.1 mL/min (A) (by C-G formula based on SCr of 2.69 mg/dL (H)). Recent Labs  Lab 11/16/2021 2237  WBC 15.1*  LATICACIDVEN 2.4*    Liver Function Tests: Recent Labs  Lab 12/12/2021 2237  AST 55*  ALT 6  ALKPHOS 107  BILITOT 0.7  PROT 6.2*  ALBUMIN 2.2*   No results for input(s): "LIPASE", "AMYLASE" in the last 168 hours. No results for input(s): "AMMONIA" in the last 168 hours.  ABG No results found for: "PHART", "PCO2ART", "PO2ART", "HCO3", "TCO2", "ACIDBASEDEF", "O2SAT"   Coagulation Profile: Recent Labs  Lab 12/06/2021 2237  INR 1.4*    Cardiac Enzymes: Recent Labs  Lab 11/24/2021 2237  CKTOTAL 896*    HbA1C: Hgb A1c MFr Bld  Date/Time Value Ref Range Status  04/02/2018 08:05 AM 5.5 <5.7 % of total Hgb Final    Comment:    For the purpose of screening for the presence of diabetes: . <5.7%       Consistent with the absence of diabetes 5.7-6.4%    Consistent with increased risk for diabetes             (prediabetes) > or =6.5%  Consistent with diabetes . This assay result is consistent with a decreased risk of diabetes. . Currently, no consensus exists regarding use of hemoglobin A1c for diagnosis of diabetes in children. . According to American Diabetes Association (ADA) guidelines, hemoglobin A1c <7.0% represents optimal control in non-pregnant diabetic patients. Different metrics may apply to specific patient populations.  Standards of Medical Care in Diabetes(ADA). Marland Kitchen   10/02/2017 11:43 AM 5.4 <5.7 % of total Hgb Final    Comment:    For the purpose of screening for the presence of diabetes: . <5.7%       Consistent with the absence of diabetes 5.7-6.4%    Consistent with increased risk for diabetes             (prediabetes) > or =6.5%  Consistent with diabetes . This  assay result is consistent with a decreased risk of diabetes. . Currently, no consensus exists regarding use of hemoglobin A1c for diagnosis of diabetes in children. . According to American Diabetes Association (ADA) guidelines, hemoglobin A1c <7.0% represents optimal control in non-pregnant diabetic patients. Different metrics may apply to specific patient populations.  Standards of Medical Care in Diabetes(ADA). .     CBG: No results for input(s): "GLUCAP" in the last 168 hours.  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   Surgical History    Past Surgical History:  Procedure Laterality Date   BACK SURGERY  April  2011   COLONOSCOPY     ESOPHAGOGASTRODUODENOSCOPY N/A 06/22/2017   Procedure: ESOPHAGOGASTRODUODENOSCOPY (EGD);  Surgeon: Toledo, Benay Pike, MD;  Location: [facility] ENDOSCOPY;  Service: Gastroenterology;  Laterality: N/A;   HIP ARTHROPLASTY Right 09/30/2021   Procedure: ARTHROPLASTY BIPOLAR HIP (HEMIARTHROPLASTY);  Surgeon: Renee Harder, MD;  Location: [facility] ORS;  Service: Orthopedics;  Laterality: Right;     Social History   reports that he quit smoking about 38 years ago. His smoking use included cigarettes. He has a 60.00 pack-year smoking history. He has never used smokeless tobacco. He reports that he does not currently use alcohol. He reports that he does not use drugs.   Family History   His family history includes Healthy in his brother and mother.   Allergies No Known Allergies   Home  Medications  Prior to Admission medications   Medication Sig Start Date End Date Taking? Authorizing Provider  ATIVAN 0.5 MG tablet Take 0.5 mg by mouth every 8 (eight) hours as needed for agitation. 11/05/21  Yes [provider]  atorvastatin (LIPITOR) 20 MG tablet Take 1 tablet (20 mg total) by mouth daily. 03/17/21  Yes Bo Merino, FNP  carbidopa-levodopa (SINEMET IR) 25-250 MG tablet Take 1 tablet by mouth 4 (four) times daily. 04/01/20  Yes [provider]  divalproex (DEPAKOTE) 125 MG DR tablet Take 125 mg by mouth 2 (two) times daily. 11/17/21  Yes [provider]  donepezil (ARICEPT) 10 MG tablet Take 1 tablet by mouth at bedtime. 02/21/19  Yes [provider]  ferrous sulfate 325 (65 FE) MG tablet Take 1 tablet (325 mg total) by mouth daily with breakfast. 10/05/21  Yes Sharen Hones, MD  HYDROcodone-acetaminophen (NORCO/VICODIN) 5-325 MG tablet Take 1 tablet by mouth every 4 (four) hours as needed for moderate pain (pain score 4-6). 10/05/21  Yes Sharen Hones, MD  latanoprost (XALATAN) 0.005 % ophthalmic solution Place 1 drop into both eyes at bedtime. 06/13/17  Yes Lada, Satira Anis, MD  memantine (NAMENDA) 5 MG tablet Take 5 mg by mouth daily. 02/15/21  Yes [provider]  omeprazole (PRILOSEC) 20 MG capsule TAKE 1 CAPSULE BY MOUTH DAILY 05/18/21  Yes Bo Merino, FNP  senna-docusate (SENOKOT-S) 8.6-50 MG tablet Take 2 tablets by mouth 2 (two) times daily as needed for mild constipation. 10/05/21  Yes Sharen Hones, MD  traZODone (DESYREL) 50 MG tablet Take 50 mg by mouth at bedtime. 02/15/21  Yes [provider]  vitamin B-12 (CYANOCOBALAMIN) 1000 MCG tablet Take 1,000 mcg by mouth daily.   Yes [provider]    Scheduled Meds:  hydrocortisone sod succinate (SOLU-CORTEF) inj  100 mg Intravenous Q12H   Continuous Infusions:  sodium chloride     sodium chloride     lactated ringers     norepinephrine (LEVOPHED) Adult  infusion Stopped (11-20-2021 0131)   PRN Meds:.acetaminophen **OR** acetaminophen, docusate sodium, glycopyrrolate **OR** glycopyrrolate **OR** glycopyrrolate, LORazepam, morphine injection, polyethylene glycol, polyvinyl alcohol  Active Hospital Problem list   Acute hypoxic respiratory failure Severe sepsis with shock secondary to UTI Lactic acidosis AKI Hypernatremia Acute rhabdomyolysis Acute Adrenal insufficiency Severe symptomatic Anemia  Assessment & Plan:  Acute Hypoxic Respiratory Failure secondary to  severe sepsis with shock -Supplemental O2 as needed to maintain O2 saturations 88 to 92% -HHFNC/NRB, wean as tolerated -Follow intermittent ABG and chest x-ray as needed -Patient is  at high risk for decompensation requiring mechanical ventilation however he is currently DNR/DNI per form from facility and patient's family  who are currently at the bedside  Sepsis with septic shock due to suspected UTI &Sacral Wound Infection Lactic: 2.4>2.8, Baseline PCT: 0.64, UA: + Initial interventions/workup included: 3.5 L of NS/LR & Cefepime/ Vancomycin/ Metronidazole -Supplemental oxygen as needed, to maintain SpO2 > 90% -F/u cultures, trend lactic/ PCT -Monitor WBC/ fever curve -IV antibiotics: cefepime & vancomycin  -IVF hydration as needed -Pressors for MAP goal >65 -Strict I/O's   AKI on CKD, likely ATN in the setting of above Hypernatremia Acute Rhabdomyolysis Non Anion Gap Metabolic Acidosis -Monitor I&O's / urinary output -Follow BMP -Ensure adequate renal perfusion -Avoid nephrotoxic agents as able -Replace electrolytes as indicated  Acute on Chronic Symptomatic Anemia Hx Iron Deficiency Anemia Hgb on admission 6.7 drop from 8.8 a month ago. No clear source of active bleeding - IVF resuscitation to maintain MAP>65 - H&H monitoring q6h - Blood Consent.  Transfuse PRN Hgb<7 - Pantoprazole '40mg'$  IV BID - Hold NSAIDs, steroids, ASA  Acute Adrenal Insufficiency likely in  the setting of severe sepsis -Treat underlying cause -Start stress dose steroids -start vasopressor as above to keep MAP>65  Best practice:  Diet:  NPO Pain/Anxiety/Delirium protocol (if indicated): No VAP protocol (if indicated): Not indicated DVT prophylaxis: Contraindicated GI prophylaxis: PPI Glucose control:  SSI No Central venous access:  Yes, and it is still needed Arterial line:  N/A Foley:  Yes, and it is still needed Mobility:  bed rest  PT consulted: N/A Last date of multidisciplinary goals of care discussion [Dec 19, 2021] Code Status:  DNR Disposition: ICU   = Goals of Care = Code Status Order: DNR  Primary Emergency Contact: Baune,Pam Per patient's current code status and family discussion with Wife and Daughter, Patientwishes to pursue ongoing treatment, but concurred that if deteriorated to pulselessness, patient would prefer a natural death as opposed to invasive measures such as CPR and intubation.  Family should be immediately contacted in such situations.  Critical care time: 45 minutes       Rufina Falco, DNP, CCRN, FNP-C, AGACNP-BC Acute Care Nurse Practitioner [facility]  PCCM on call pager [phone] until 7 am

## 2021-12-17 NOTE — Procedures (Signed)
Central Venous Catheter Insertion Procedure Note  Michael Stevens  [id number]  05/18/1940  Date:December 11, 2021  Time:1:14 AM   Provider Performing:Garren Greenman A Mikaya Bunner   Procedure: Insertion of Non-tunneled Central Venous Catheter(36556) with US guidance (48889)   Indication(s) Medication administration and Difficult access  Consent Unable to obtain consent due to emergent nature of procedure.  Anesthesia Topical only with 1% lidocaine   Timeout Verified patient identification, verified procedure, site/side was marked, verified correct patient position, special equipment/implants available, medications/allergies/relevant history reviewed, required imaging and test results available.  Sterile Technique Maximal sterile technique including full sterile barrier drape, hand hygiene, sterile gown, sterile gloves, mask, hair covering, sterile ultrasound probe cover (if used).  Procedure Description Area of catheter insertion was cleaned with chlorhexidine and draped in sterile fashion.  With real-time ultrasound guidance a central venous catheter was placed into the right femoral vein. Nonpulsatile blood flow and easy flushing noted in all ports.  The catheter was sutured in place and sterile dressing applied.  Complications/Tolerance None; patient tolerated the procedure well. Chest X-ray is ordered to verify placement for internal jugular or subclavian cannulation.   Chest x-ray is not ordered for femoral cannulation.  EBL Minimal  Specimen(s) None  Rufina Falco, DNP, CCRN, FNP-C, AGACNP-BC Acute Care & Family Nurse Practitioner  [facility]  See Amion for personal pager PCCM on call pager [phone] until 7 am

## 2021-12-17 NOTE — Progress Notes (Signed)
Report received from [facility] in ED

## 2021-12-17 NOTE — Progress Notes (Signed)
Pt arrived on floor via ED stretcher. Pt noted on 100% NRB. Agonal respirations noted. Hypotension noted. Tachycardia noted. Family is on the way to bedside. Ouma NP conversated with wife whom is also inpatient. Pt transferred to ICU bed and placed on all monitors.

## 2021-12-17 NOTE — ED Notes (Signed)
Bair hugger decreased to 32c degrees per NP.

## 2021-12-17 NOTE — ED Notes (Signed)
Provider is aware of vital signs

## 2021-12-17 NOTE — Progress Notes (Addendum)
26-Nov-2021 1315: was entering funeral home information, reviewed H and P and realized patient had experienced a fall in June (6/14, came to [facility] 6/15 and sustained a hip fracture. He underwent surgery and then was released to [facility]. He returned to [facility] and succumbed to his illness. At 1300 contacted [name] to tell them patient maybe a medical examiner case. [place], [facility] [place] who was on during the time of patient death. Gave him information, said he would talk it over with [employer] in [place] and call me back as to whether this would be a ME case. Had contacted [employer] to explain what had happened. [name] called back and said he would be a medical examiner's case and he would contact [name] in [place] and would probably see the patient on 8/5 in the afternoon

## 2021-12-17 DEATH — deceased
